# Patient Record
Sex: Female | Born: 1970 | Race: Black or African American | Hispanic: No | Marital: Single | Smoking: Never smoker
Health system: Southern US, Community
[De-identification: ages and names within clinical notes are randomized; demographics above are authoritative.]

## PROBLEM LIST (undated history)

## (undated) DIAGNOSIS — E119 Type 2 diabetes mellitus without complications: Secondary | ICD-10-CM

## (undated) DIAGNOSIS — K3184 Gastroparesis: Secondary | ICD-10-CM

## (undated) DIAGNOSIS — G43909 Migraine, unspecified, not intractable, without status migrainosus: Secondary | ICD-10-CM

## (undated) DIAGNOSIS — M5136 Other intervertebral disc degeneration, lumbar region: Secondary | ICD-10-CM

## (undated) DIAGNOSIS — E785 Hyperlipidemia, unspecified: Secondary | ICD-10-CM

## (undated) DIAGNOSIS — K219 Gastro-esophageal reflux disease without esophagitis: Secondary | ICD-10-CM

## (undated) DIAGNOSIS — F419 Anxiety disorder, unspecified: Secondary | ICD-10-CM

## (undated) DIAGNOSIS — M51369 Other intervertebral disc degeneration, lumbar region without mention of lumbar back pain or lower extremity pain: Secondary | ICD-10-CM

## (undated) DIAGNOSIS — F5105 Insomnia due to other mental disorder: Secondary | ICD-10-CM

## (undated) HISTORY — PX: FRACTURE SURGERY: SHX138

## (undated) HISTORY — DX: Gastroparesis: K31.84

## (undated) HISTORY — PX: TUBAL LIGATION: SHX77

## (undated) HISTORY — DX: Hyperlipidemia, unspecified: E78.5

## (undated) HISTORY — DX: Other intervertebral disc degeneration, lumbar region: M51.36

## (undated) HISTORY — PX: CARPAL TUNNEL RELEASE: SHX101

## (undated) HISTORY — DX: Type 2 diabetes mellitus without complications: E11.9

## (undated) HISTORY — PX: ABDOMINAL HYSTERECTOMY: SHX81

## (undated) HISTORY — DX: Other intervertebral disc degeneration, lumbar region without mention of lumbar back pain or lower extremity pain: M51.369

---

## 2013-06-01 DIAGNOSIS — G5600 Carpal tunnel syndrome, unspecified upper limb: Secondary | ICD-10-CM | POA: Insufficient documentation

## 2015-08-28 ENCOUNTER — Encounter (HOSPITAL_COMMUNITY): Payer: Self-pay | Admitting: *Deleted

## 2015-08-28 ENCOUNTER — Emergency Department (HOSPITAL_COMMUNITY)
Admission: EM | Admit: 2015-08-28 | Discharge: 2015-08-29 | Disposition: A | Discharge status: Home / Self Care | Payer: Self-pay | Attending: Emergency Medicine | Admitting: Emergency Medicine

## 2015-08-28 DIAGNOSIS — K219 Gastro-esophageal reflux disease without esophagitis: Secondary | ICD-10-CM | POA: Insufficient documentation

## 2015-08-28 DIAGNOSIS — F419 Anxiety disorder, unspecified: Secondary | ICD-10-CM | POA: Insufficient documentation

## 2015-08-28 DIAGNOSIS — E119 Type 2 diabetes mellitus without complications: Secondary | ICD-10-CM

## 2015-08-28 DIAGNOSIS — E1165 Type 2 diabetes mellitus with hyperglycemia: Secondary | ICD-10-CM | POA: Insufficient documentation

## 2015-08-28 DIAGNOSIS — Z8679 Personal history of other diseases of the circulatory system: Secondary | ICD-10-CM | POA: Insufficient documentation

## 2015-08-28 DIAGNOSIS — R739 Hyperglycemia, unspecified: Secondary | ICD-10-CM

## 2015-08-28 DIAGNOSIS — Z79899 Other long term (current) drug therapy: Secondary | ICD-10-CM | POA: Insufficient documentation

## 2015-08-28 HISTORY — DX: Insomnia due to other mental disorder: F41.9

## 2015-08-28 HISTORY — DX: Gastro-esophageal reflux disease without esophagitis: K21.9

## 2015-08-28 HISTORY — DX: Migraine, unspecified, not intractable, without status migrainosus: G43.909

## 2015-08-28 HISTORY — DX: Type 2 diabetes mellitus without complications: E11.9

## 2015-08-28 HISTORY — DX: Insomnia due to other mental disorder: F51.05

## 2015-08-28 LAB — BLOOD GAS, ARTERIAL
ACID-BASE DEFICIT: 1.3 mmol/L (ref 0.0–2.0)
Bicarbonate: 23.7 mEq/L (ref 20.0–24.0)
DRAWN BY: 317771
FIO2: 0.21
O2 Saturation: 96.4 %
PCO2 ART: 34.2 mmHg — AB (ref 35.0–45.0)
PO2 ART: 81.6 mmHg (ref 80.0–100.0)
pH, Arterial: 7.431 (ref 7.350–7.450)

## 2015-08-28 LAB — CBC
HEMATOCRIT: 39.6 % (ref 36.0–46.0)
Hemoglobin: 14.5 g/dL (ref 12.0–15.0)
MCH: 30.9 pg (ref 26.0–34.0)
MCHC: 36.6 g/dL — AB (ref 30.0–36.0)
MCV: 84.3 fL (ref 78.0–100.0)
Platelets: 222 10*3/uL (ref 150–400)
RBC: 4.7 MIL/uL (ref 3.87–5.11)
RDW: 12.2 % (ref 11.5–15.5)
WBC: 7.2 10*3/uL (ref 4.0–10.5)

## 2015-08-28 LAB — BASIC METABOLIC PANEL
Anion gap: 12 (ref 5–15)
BUN: 7 mg/dL (ref 6–20)
CHLORIDE: 95 mmol/L — AB (ref 101–111)
CO2: 26 mmol/L (ref 22–32)
CREATININE: 1.05 mg/dL — AB (ref 0.44–1.00)
Calcium: 9.1 mg/dL (ref 8.9–10.3)
GFR calc Af Amer: >60 mL/min (ref >60)
GLUCOSE: 548 mg/dL — AB (ref 65–99)
POTASSIUM: 3.6 mmol/L (ref 3.5–5.1)
Sodium: 133 mmol/L — ABNORMAL LOW (ref 135–145)

## 2015-08-28 LAB — BLOOD GAS, VENOUS
ACID-BASE DEFICIT: 4.3 mmol/L — AB (ref 0.0–2.0)
ACID-BASE DEFICIT: 6.4 mmol/L — AB (ref 0.0–2.0)
BICARBONATE: 18 meq/L — AB (ref 20.0–24.0)
Bicarbonate: 17.5 mEq/L — ABNORMAL LOW (ref 20.0–24.0)
O2 SAT: 52.1 %
O2 SAT: 77.8 %
PCO2 VEN: 60.8 mmHg — AB (ref 45.0–50.0)
PO2 VEN: 53.7 mmHg — AB (ref 30.0–45.0)
pCO2, Ven: 67.7 mmHg — ABNORMAL HIGH (ref 45.0–50.0)
pH, Ven: 7.162 — CL (ref 7.250–7.300)
pH, Ven: 7.163 — CL (ref 7.250–7.300)
pO2, Ven: 34.4 mmHg (ref 30.0–45.0)

## 2015-08-28 LAB — URINE MICROSCOPIC-ADD ON

## 2015-08-28 LAB — URINALYSIS, ROUTINE W REFLEX MICROSCOPIC
BILIRUBIN URINE: NEGATIVE
Glucose, UA: 500 mg/dL — AB
Ketones, ur: 15 mg/dL — AB
Leukocytes, UA: NEGATIVE
Nitrite: NEGATIVE
PH: 5.5 (ref 5.0–8.0)
Protein, ur: NEGATIVE mg/dL
SPECIFIC GRAVITY, URINE: 1.01 (ref 1.005–1.030)
Urobilinogen, UA: 0.2 mg/dL (ref 0.0–1.0)

## 2015-08-28 LAB — CBG MONITORING, ED
GLUCOSE-CAPILLARY: 353 mg/dL — AB (ref 65–99)
Glucose-Capillary: 568 mg/dL (ref 65–99)
Glucose-Capillary: 391 mg/dL — ABNORMAL HIGH (ref 65–99)

## 2015-08-28 MED ORDER — SODIUM CHLORIDE 0.9 % IV BOLUS (SEPSIS)
1000.0000 mL | Freq: Once | INTRAVENOUS | Status: AC
  Administered 2015-08-28: 1000 mL via INTRAVENOUS

## 2015-08-28 MED ORDER — INSULIN ASPART 100 UNIT/ML ~~LOC~~ SOLN
9.0000 [IU] | Freq: Once | SUBCUTANEOUS | Status: AC
  Administered 2015-08-28: 9 [IU] via INTRAVENOUS

## 2015-08-28 NOTE — ED Notes (Signed)
Dr.Schlossman notified of critical ph

## 2015-08-28 NOTE — ED Notes (Signed)
CRITICAL VALUE ALERT  Critical value received:  Ph 7.163  Date of notification:  08/28/2015  Time of notification:  2200  Critical value read back:Yes.    Nurse who received alert:  Villa HerbKristy Stewart Sasaki ,rn  MD notified (1st page):  Dr Dalene Seltzerschlossman  Time of first page:  2200  MD notified (2nd page):  Time of second page:  Responding MD:  Dr Dalene Seltzerschlossman  Time MD responded:  2200

## 2015-08-28 NOTE — ED Notes (Signed)
Pt states her friend checked her blood sugar and it read high on the meter. Pt has polydipsia, polyuria, dizziness, and feeling strange x 2 weeks.

## 2015-08-29 MED ORDER — METFORMIN HCL 500 MG PO TABS
500.0000 mg | ORAL_TABLET | Freq: Once | ORAL | Status: AC
  Administered 2015-08-29: 500 mg via ORAL
  Filled 2015-08-29: qty 1

## 2015-08-29 MED ORDER — METFORMIN HCL 500 MG PO TABS
500.0000 mg | ORAL_TABLET | Freq: Two times a day (BID) | ORAL | Status: DC
Start: 2015-08-29 — End: 2020-02-24

## 2015-08-29 NOTE — ED Provider Notes (Signed)
CSN: [id number]     Arrival date & time 08/28/15  1819 History   First MD Initiated Contact with Patient 08/28/15 1926     Chief Complaint  Patient presents with  . Hyperglycemia     (Consider location/radiation/quality/duration/timing/severity/associated sxs/prior Treatment) Patient is a 44 y.o. female presenting with hyperglycemia.  Hyperglycemia Blood sugar level PTA:  Greater than 500 Severity:  Severe Onset quality:  Unable to specify Duration: feeling off for 2 months, increased thirst for last week. Timing:  Constant Progression:  Unchanged Chronicity:  New Diabetes status:  Non-diabetic (previoiusly pre-diabetes) Context: new diabetes diagnosis   Relieved by:  Nothing Ineffective treatments:  None tried Associated symptoms: dehydration, increased thirst and polyuria   Associated symptoms: no abdominal pain, no altered mental status, no blurred vision, no chest pain, no diaphoresis, no dysuria, no fever, no nausea, no shortness of breath and no vomiting     Past Medical History  Diagnosis Date  . Diabetes mellitus without complication (HCC)     borderline  . Hyposomnia, insomnia or sleeplessness associated with anxiety   . Migraine   . Acid reflux    Past Surgical History  Procedure Laterality Date  . Abdominal hysterectomy      2009  . Fracture surgery      jaw  . Tubal ligation     History reviewed. No pertinent family history. Social History  Substance Use Topics  . Smoking status: Never Smoker   . Smokeless tobacco: None  . Alcohol Use: No   OB History    No data available     Review of Systems  Constitutional: Negative for fever and diaphoresis.  HENT: Negative for sore throat.   Eyes: Negative for blurred vision and visual disturbance.  Respiratory: Negative for cough and shortness of breath.   Cardiovascular: Negative for chest pain.  Gastrointestinal: Negative for nausea, vomiting and abdominal pain.  Endocrine: Positive for polydipsia and  polyuria.  Genitourinary: Negative for dysuria and difficulty urinating.  Musculoskeletal: Negative for back pain and neck pain.  Skin: Negative for rash.  Neurological: Negative for syncope and headaches.      Allergies  Review of patient's allergies indicates no known allergies.  Home Medications   Prior to Admission medications   Medication Sig Start Date End Date Taking? Authorizing Provider  ALPRAZolam Prudy Feeler) 1 MG tablet Take 1 mg by mouth 2 (two) times daily as needed. anxiety 08/25/15   Historical Provider, MD  HYDROcodone-acetaminophen (NORCO) 10-325 MG tablet Take 1 tablet by mouth every 6 (six) hours as needed. pain 08/23/15   Historical Provider, MD  metFORMIN (GLUCOPHAGE) 500 MG tablet Take 1 tablet (500 mg total) by mouth 2 (two) times daily with a meal. 08/29/15   Alvira Monday, MD  nabumetone (RELAFEN) 750 MG tablet Take 750 mg by mouth 2 (two) times daily as needed. Muscle spasm 05/29/15   Historical Provider, MD  omeprazole (PRILOSEC) 20 MG capsule Take 20 mg by mouth 2 (two) times daily as needed. Acid reflux 05/31/15  Yes Historical Provider, MD  sucralfate (CARAFATE) 1 G tablet Take 1 g by mouth 3 (three) times daily as needed. For stomach pain 05/31/15   Historical Provider, MD   BP 138/96 mmHg  Pulse 85  Temp(Src) 98.4 F (36.9 C) (Oral)  Resp 18  Ht  (1.6 m)  Wt 204 lb (92.534 kg)  BMI 36.15 kg/m2  SpO2 99% Physical Exam  Constitutional: She is oriented to person, place, and time. She appears well-developed  and well-nourished. No distress.  HENT:  Head: Normocephalic and atraumatic.  Eyes: Conjunctivae and EOM are normal.  Neck: Normal range of motion.  Cardiovascular: Normal rate, regular rhythm, normal heart sounds and intact distal pulses.  Exam reveals no gallop and no friction rub.   No murmur heard. Pulmonary/Chest: Effort normal and breath sounds normal. No respiratory distress. She has no wheezes. She has no rales.  Abdominal: Soft. She  exhibits no distension. There is no tenderness. There is no guarding.  Musculoskeletal: She exhibits no edema or tenderness.  Neurological: She is alert and oriented to person, place, and time.  Skin: Skin is warm and dry. No rash noted. She is not diaphoretic. No erythema.  Nursing note and vitals reviewed.   ED Course  Procedures (including critical care time) Labs Review Labs Reviewed  BASIC METABOLIC PANEL - Abnormal; Notable for the following:    Sodium 133 (*)    Chloride 95 (*)    Glucose, Bld 548 (*)    Creatinine, Ser 1.05 (*)    All other components within normal limits  CBC - Abnormal; Notable for the following:    MCHC 36.6 (*)    All other components within normal limits  URINALYSIS, ROUTINE W REFLEX MICROSCOPIC (NOT AT [facility]) - Abnormal; Notable for the following:    Color, Urine STRAW (*)    Glucose, UA 500 (*)    Hgb urine dipstick TRACE (*)    Ketones, ur 15 (*)    All other components within normal limits  BLOOD GAS, VENOUS - Abnormal; Notable for the following:    pH, Ven 7.162 (*)    pCO2, Ven 67.7 (*)    Bicarbonate 18.0 (*)    Acid-base deficit 4.3 (*)    All other components within normal limits  URINE MICROSCOPIC-ADD ON - Abnormal; Notable for the following:    Squamous Epithelial / LPF FEW (*)    Bacteria, UA FEW (*)    All other components within normal limits  BLOOD GAS, VENOUS - Abnormal; Notable for the following:    pH, Ven 7.163 (*)    pCO2, Ven 60.8 (*)    pO2, Ven 53.7 (*)    Bicarbonate 17.5 (*)    Acid-base deficit 6.4 (*)    All other components within normal limits  BLOOD GAS, ARTERIAL - Abnormal; Notable for the following:    pCO2 arterial 34.2 (*)    All other components within normal limits  CBG MONITORING, ED - Abnormal; Notable for the following:    Glucose-Capillary 568 (*)    All other components within normal limits  CBG MONITORING, ED - Abnormal; Notable for the following:    Glucose-Capillary 391 (*)    All other  components within normal limits  CBG MONITORING, ED - Abnormal; Notable for the following:    Glucose-Capillary 353 (*)    All other components within normal limits    Imaging Review No results found. I have personally reviewed and evaluated these images and lab results as part of my medical decision-making.   EKG Interpretation None      MDM   Final diagnoses:  Type 2 diabetes mellitus without complication, without long-term current use of insulin (HCC)  Hyperglycemia   44yo female with prior hx of pre-diabetes presents with concern for polyuria, polyldipsia, and glucose found to be over 500.  Pt without signs of DKA.  Initially venous blood gas shows respiratory acidosis with pH of 7.1, this value was repeated and found  to be the same, however clinically does not fit with pt presentation, no sign of CO2 retention, no COPD exacerbation/no somnolence or sign of drug overdose or apnea.  Initially discussed with hospitalist on call, then repeated blood gas as ABG and found to be normal, which is more consistent with pt presentation. Pt given insulin with decrease in blood sugars. WIll start metformin  BID and recommend close follow up with PCP. Patient discharged in stable condition with understanding of reasons to return.      Alvira Monday, MD 08/29/15 1246

## 2015-08-29 NOTE — ED Notes (Signed)
Patient verbalizes understanding of discharge instructions, prescription medications, home care and follow up care. Patient ambulatory out of department at this time. 

## 2018-06-10 ENCOUNTER — Other Ambulatory Visit: Payer: Self-pay | Admitting: Internal Medicine

## 2018-06-10 DIAGNOSIS — M545 Low back pain: Secondary | ICD-10-CM

## 2018-06-17 ENCOUNTER — Ambulatory Visit (HOSPITAL_COMMUNITY)
Admission: RE | Admit: 2018-06-17 | Discharge: 2018-06-17 | Disposition: A | Discharge status: Home / Self Care | Payer: 59 | Source: Ambulatory Visit | Attending: Internal Medicine | Admitting: Internal Medicine

## 2018-06-17 DIAGNOSIS — M47816 Spondylosis without myelopathy or radiculopathy, lumbar region: Secondary | ICD-10-CM | POA: Diagnosis not present

## 2018-06-17 DIAGNOSIS — M5136 Other intervertebral disc degeneration, lumbar region: Secondary | ICD-10-CM | POA: Insufficient documentation

## 2018-06-17 DIAGNOSIS — M545 Low back pain: Secondary | ICD-10-CM | POA: Insufficient documentation

## 2018-07-04 DIAGNOSIS — M4316 Spondylolisthesis, lumbar region: Secondary | ICD-10-CM | POA: Insufficient documentation

## 2018-07-04 DIAGNOSIS — I10 Essential (primary) hypertension: Secondary | ICD-10-CM | POA: Insufficient documentation

## 2018-10-17 DIAGNOSIS — M545 Low back pain, unspecified: Secondary | ICD-10-CM | POA: Insufficient documentation

## 2018-12-19 ENCOUNTER — Other Ambulatory Visit (HOSPITAL_BASED_OUTPATIENT_CLINIC_OR_DEPARTMENT_OTHER): Payer: Self-pay

## 2018-12-19 DIAGNOSIS — R5383 Other fatigue: Secondary | ICD-10-CM

## 2019-01-01 ENCOUNTER — Ambulatory Visit: Payer: BLUE CROSS/BLUE SHIELD | Attending: Internal Medicine | Admitting: Neurology

## 2019-01-01 DIAGNOSIS — R5383 Other fatigue: Secondary | ICD-10-CM | POA: Insufficient documentation

## 2019-01-01 DIAGNOSIS — Z7984 Long term (current) use of oral hypoglycemic drugs: Secondary | ICD-10-CM | POA: Insufficient documentation

## 2019-01-01 DIAGNOSIS — Z79899 Other long term (current) drug therapy: Secondary | ICD-10-CM | POA: Diagnosis not present

## 2019-01-09 NOTE — Procedures (Signed)
   [facility] Aerabella Galasso A. Gerilyn Pilgrim, MD     [email or web address]             NOCTURNAL POLYSOMNOGRAPHY   LOCATION: [facility]  Patient Name: Lacey Bradshaw, Lacey Bradshaw Date: 01/01/2019 Gender: Female D.O.B: 10-08-1971 Age (years): 47 Referring Provider: Catalina Pizza Height (inches): 63 Interpreting Physician: Beryle Beams MD, ABSM Weight (lbs): 217 RPSGT: Peak, Robert BMI: 38 MRN: [id number] Neck Size: 16.00 CLINICAL INFORMATION Sleep Study Type: NPSG     Indication for sleep study: Fatigue     Epworth Sleepiness Score: 0     SLEEP STUDY TECHNIQUE As per the AASM Manual for the Scoring of Sleep and Associated Events v2.3 (April 2016) with a hypopnea requiring 4% desaturations.  The channels recorded and monitored were frontal, central and occipital EEG, electrooculogram (EOG), submentalis EMG (chin), nasal and oral airflow, thoracic and abdominal wall motion, anterior tibialis EMG, snore microphone, electrocardiogram, and pulse oximetry.  MEDICATIONS Medications self-administered by patient taken the night of the study : N/A  Current Outpatient Medications:  .  ALPRAZolam (XANAX) 1 MG tablet, Take 1 mg by mouth 2 (two) times daily as needed. anxiety, Disp: , Rfl: 0 .  HYDROcodone-acetaminophen (NORCO) 10-325 MG tablet, Take 1 tablet by mouth every 6 (six) hours as needed. pain, Disp: , Rfl: 0 .  metFORMIN (GLUCOPHAGE) 500 MG tablet, Take 1 tablet (500 mg total) by mouth 2 (two) times daily with a meal., Disp: 32 tablet, Rfl: 0 .  nabumetone (RELAFEN) 750 MG tablet, Take 750 mg by mouth 2 (two) times daily as needed. Muscle spasm, Disp: , Rfl: 3 .  omeprazole (PRILOSEC) 20 MG capsule, Take 20 mg by mouth 2 (two) times daily as needed. Acid reflux, Disp: , Rfl: 5 .  sucralfate (CARAFATE) 1 G tablet, Take 1 g by mouth 3 (three) times daily as needed. For stomach pain, Disp: , Rfl: 2      SLEEP ARCHITECTURE The study was initiated at 9:40:40 PM and ended at  4:22:45 AM.  Sleep onset time was 43.3 minutes and the sleep efficiency was 83.9%%. The total sleep time was 337.3 minutes.  Stage REM latency was 141.0 minutes.  The patient spent 8.3%% of the night in stage N1 sleep, 77.8%% in stage N2 sleep, 2.1%% in stage N3 and 11.9% in REM.  Alpha intrusion was absent.  Supine sleep was 66.58%.  RESPIRATORY PARAMETERS The overall apnea/hypopnea index (AHI) was 2.1 per hour. There were 7 total apneas, including 0 obstructive, 7 central and 0 mixed apneas. There were 5 hypopneas and 33 RERAs.  The AHI during Stage REM sleep was 0.0 per hour.  AHI while supine was 1.9 per hour.  The mean oxygen saturation was 93.8%. The minimum SpO2 during sleep was 84.0%.  soft snoring was noted during this study.  CARDIAC DATA The 2 lead EKG demonstrated sinus rhythm. The mean heart rate was 72.3 beats per minute. Other EKG findings include: None.  LEG MOVEMENT DATA The total PLMS were 0 with a resulting PLMS index of 0.0. Associated arousal with leg movement index was 0.0.  IMPRESSIONS 1. No significant obstructive sleep apnea occurred during this study. 2. No significant central sleep apnea occurred during this study.   Argie Ramming, MD Diplomate, American Board of Sleep Medicine. ELECTRONICALLY SIGNED ON:  01/09/2019, 8:30 AM [facility] PH: [phone]   FX: [phone] ACCREDITED BY THE AMERICAN ACADEMY OF SLEEP MEDICINE

## 2019-01-10 ENCOUNTER — Encounter (HOSPITAL_COMMUNITY): Payer: Self-pay | Admitting: Emergency Medicine

## 2019-01-10 ENCOUNTER — Other Ambulatory Visit: Payer: Self-pay

## 2019-01-10 ENCOUNTER — Emergency Department (HOSPITAL_COMMUNITY)
Admission: EM | Admit: 2019-01-10 | Discharge: 2019-01-10 | Disposition: A | Discharge status: Home / Self Care | Payer: BLUE CROSS/BLUE SHIELD | Attending: Emergency Medicine | Admitting: Emergency Medicine

## 2019-01-10 ENCOUNTER — Emergency Department (HOSPITAL_COMMUNITY): Payer: BLUE CROSS/BLUE SHIELD

## 2019-01-10 DIAGNOSIS — Z7984 Long term (current) use of oral hypoglycemic drugs: Secondary | ICD-10-CM | POA: Diagnosis not present

## 2019-01-10 DIAGNOSIS — J09X2 Influenza due to identified novel influenza A virus with other respiratory manifestations: Secondary | ICD-10-CM | POA: Insufficient documentation

## 2019-01-10 DIAGNOSIS — J029 Acute pharyngitis, unspecified: Secondary | ICD-10-CM | POA: Diagnosis present

## 2019-01-10 DIAGNOSIS — J101 Influenza due to other identified influenza virus with other respiratory manifestations: Secondary | ICD-10-CM

## 2019-01-10 LAB — INFLUENZA PANEL BY PCR (TYPE A & B)
Influenza A By PCR: POSITIVE — AB
Influenza B By PCR: NEGATIVE

## 2019-01-10 LAB — GROUP A STREP BY PCR: Group A Strep by PCR: NOT DETECTED

## 2019-01-10 MED ORDER — BENZONATATE 100 MG PO CAPS: 100.0000 mg | ORAL_CAPSULE | Freq: Three times a day (TID) | ORAL | 0 refills | Status: DC | PRN

## 2019-01-10 MED ORDER — ACETAMINOPHEN 325 MG PO TABS
650.0000 mg | ORAL_TABLET | Freq: Once | ORAL | Status: AC | PRN
  Administered 2019-01-10: 650 mg via ORAL
  Filled 2019-01-10: qty 2

## 2019-01-10 MED ORDER — OSELTAMIVIR PHOSPHATE 75 MG PO CAPS: 75.0000 mg | ORAL_CAPSULE | Freq: Two times a day (BID) | ORAL | 0 refills | Status: DC

## 2019-01-10 NOTE — ED Triage Notes (Signed)
Pt reports generalized body aches for three days, no OTC medications at home. Flu shot in December.

## 2019-01-10 NOTE — ED Provider Notes (Signed)
[facility] EMERGENCY DEPARTMENT Provider Note   CSN: [id number] Arrival date & time: 01/10/19  0840    History   Chief Complaint Chief Complaint  Patient presents with  . Influenza    HPI Lacey Bradshaw is a 48 y.o. female.     HPI Pt was seen at 0930.  Per pt, c/o gradual onset and persistence of constant sore throat, runny/stuffy nose, sinus congestion, and cough for the past 2-3 days.  Has been associated with subjective home fevers/chills and generalized body aches/fatigue. Denies rash, no CP/SOB, no N/V/D, no abd pain.     Past Medical History:  Diagnosis Date  . Acid reflux   . Diabetes mellitus without complication (HCC)    borderline  . Hyposomnia, insomnia or sleeplessness associated with anxiety   . Migraine     There are no active problems to display for this patient.   Past Surgical History:  Procedure Laterality Date  . ABDOMINAL HYSTERECTOMY     2009  . CARPAL TUNNEL RELEASE Left   . FRACTURE SURGERY     jaw  . TUBAL LIGATION       OB History   No obstetric history on file.      Home Medications    Prior to Admission medications   Medication Sig Start Date End Date Taking? Authorizing Provider  ALPRAZolam Prudy Feeler) 1 MG tablet Take 1 mg by mouth 2 (two) times daily as needed. anxiety 08/25/15   [provider]  HYDROcodone-acetaminophen (NORCO) 10-325 MG tablet Take 1 tablet by mouth every 6 (six) hours as needed. pain 08/23/15   [provider]  metFORMIN (GLUCOPHAGE) 500 MG tablet Take 1 tablet (500 mg total) by mouth 2 (two) times daily with a meal. 08/29/15   Alvira Monday, MD  nabumetone (RELAFEN) 750 MG tablet Take 750 mg by mouth 2 (two) times daily as needed. Muscle spasm 05/29/15   [provider]  omeprazole (PRILOSEC) 20 MG capsule Take 20 mg by mouth 2 (two) times daily as needed. Acid reflux 05/31/15   [provider]  sucralfate (CARAFATE) 1 G tablet Take 1 g by mouth 3 (three) times daily as  needed. For stomach pain 05/31/15   [provider]    Family History History reviewed. No pertinent family history.  Social History Social History   Tobacco Use  . Smoking status: Never Smoker  . Smokeless tobacco: Never Used  Substance Use Topics  . Alcohol use: No  . Drug use: No     Allergies   Patient has no known allergies.   Review of Systems Review of Systems ROS: Statement: All systems negative except as marked or noted in the HPI; Constitutional: +fever and chills, generalized body aches/fatigue.. ; ; Eyes: Negative for eye pain, redness and discharge. ; ; ENMT: Negative for ear pain, hoarseness, +nasal congestion, sinus pressure and sore throat. ; ; Cardiovascular: Negative for chest pain, palpitations, diaphoresis, dyspnea and peripheral edema. ; ; Respiratory: +cough. Negative for wheezing and stridor. ; ; Gastrointestinal: Negative for nausea, vomiting, diarrhea, abdominal pain, blood in stool, hematemesis, jaundice and rectal bleeding. . ; ; Genitourinary: Negative for dysuria, flank pain and hematuria. ; ; Musculoskeletal: Negative for back pain and neck pain. Negative for swelling and trauma.; ; Skin: Negative for pruritus, rash, abrasions, blisters, bruising and skin lesion.; ; Neuro: Negative for headache, lightheadedness and neck stiffness. Negative for weakness, altered level of consciousness, altered mental status, extremity weakness, paresthesias, involuntary movement, seizure and syncope.  Physical Exam Updated Vital Signs BP (!) 150/72   Pulse 99   Temp (!) 101.3 F (38.5 C) (Oral)   Resp 18   Ht 5\' 3"  (1.6 m)   Wt 103 kg   SpO2 98%   BMI 40.21 kg/m   Physical Exam 0935: Physical examination:  Nursing notes reviewed; Vital signs and O2 SAT reviewed;  Constitutional: Well developed, Well nourished, Well hydrated, In no acute distress; Head:  Normocephalic, atraumatic; Eyes: EOMI, PERRL, No scleral icterus; ENMT: TM's clear bilat.  +edemetous nasal turbinates bilat with clear rhinorrhea. Mouth and pharynx without lesions. No tonsillar exudates. No intra-oral edema. No submandibular or sublingual edema. No hoarse voice, no drooling, no stridor. No pain with manipulation of larynx. No trismus. Mouth and pharynx normal, Mucous membranes moist; Neck: Supple, Full range of motion, No lymphadenopathy; Cardiovascular: Regular rate and rhythm, No gallop; Respiratory: Breath sounds clear & equal bilaterally, No wheezes.  Speaking full sentences with ease, Normal respiratory effort/excursion; Chest: Nontender, Movement normal; Abdomen: Soft, Nontender, Nondistended, Normal bowel sounds; Genitourinary: No CVA tenderness; Extremities: Peripheral pulses normal, No tenderness, No edema, No calf edema or asymmetry.; Neuro: AA&Ox3, Major CN grossly intact.  Speech clear. No gross focal motor or sensory deficits in extremities.; Skin: Color normal, Warm, Dry.     ED Treatments / Results  Labs (all labs ordered are listed, but only abnormal results are displayed)   EKG None  Radiology   Procedures Procedures (including critical care time)  Medications Ordered in ED Medications  acetaminophen (TYLENOL) tablet 650 mg (650 mg Oral Given 01/10/19 0921)     Initial Impression / Assessment and Plan / ED Course  I have reviewed the triage vital signs and the nursing notes.  Pertinent labs & imaging results that were available during my care of the patient were reviewed by me and considered in my medical decision making (see chart for details).     MDM Reviewed: previous chart, nursing note and vitals Interpretation: labs and x-ray    Results for orders placed or performed during the hospital encounter of 01/10/19  Group A Strep by PCR  Result Value Ref Range   Group A Strep by PCR NOT DETECTED NOT DETECTED  Influenza panel by PCR (type A & B)  Result Value Ref Range   Influenza A By PCR POSITIVE (A) NEGATIVE   Influenza B By  PCR NEGATIVE NEGATIVE   Dg Chest 2 View Result Date: 01/10/2019 CLINICAL DATA:  Chest pain, cough EXAM: CHEST - 2 VIEW COMPARISON:  01/05/2018 FINDINGS: Heart and mediastinal contours are within normal limits. No focal opacities or effusions. No acute bony abnormality. IMPRESSION: No active cardiopulmonary disease. Electronically Signed   By: Charlett Nose M.D.   On: 01/10/2019 10:19    1100:  Tx for flu. Dx and testing d/w pt.  Questions answered.  Verb understanding, agreeable to d/c home with outpt f/u.   Final Clinical Impressions(s) / ED Diagnoses   Final diagnoses:  None    ED Discharge Orders    None       Samuel Jester, DO 01/14/19 1747

## 2019-01-10 NOTE — Discharge Instructions (Signed)
Take over the counter tylenol and ibuprofen, as directed on packaging, as needed for discomfort.  Gargle with warm water several times per day to help with discomfort.  May also use over the counter sore throat pain medicines such as chloraseptic or sucrets, as directed on packaging, as needed for discomfort. Increase your fluids for the next several days. Eat a bland diet.  Take the prescriptions as directed.  Call your regular medical doctor today to schedule a follow up appointment within the next week.  Return to the Emergency Department immediately sooner if worsening.

## 2019-01-26 ENCOUNTER — Encounter (HOSPITAL_COMMUNITY): Payer: Self-pay | Admitting: Physical Therapy

## 2019-01-26 ENCOUNTER — Other Ambulatory Visit: Payer: Self-pay

## 2019-01-26 ENCOUNTER — Ambulatory Visit (HOSPITAL_COMMUNITY): Payer: BLUE CROSS/BLUE SHIELD | Attending: Internal Medicine | Admitting: Physical Therapy

## 2019-01-26 ENCOUNTER — Encounter (HOSPITAL_COMMUNITY): Payer: Self-pay

## 2019-01-26 ENCOUNTER — Ambulatory Visit (HOSPITAL_COMMUNITY): Payer: BLUE CROSS/BLUE SHIELD

## 2019-01-26 DIAGNOSIS — M542 Cervicalgia: Secondary | ICD-10-CM | POA: Diagnosis not present

## 2019-01-26 DIAGNOSIS — M6281 Muscle weakness (generalized): Secondary | ICD-10-CM

## 2019-01-26 NOTE — Patient Instructions (Addendum)
Flexibility: Neck Retraction    Pull head straight back, keeping eyes and jaw level. Repeat _10___ times per set. Do _1___ sets per session. Do 2___ sessions per day.  http://orth.exer.us/344   Copyright  VHI. All rights reserved.  Scapular Retraction (Standing)    With arms at sides, pinch shoulder blades together. Repeat _10___ times per set. Do _1___ sets per session. Do _2___ sessions per day.  http://orth.exer.us/944   Copyright  VHI. All rights reserved.   

## 2019-01-26 NOTE — Therapy (Signed)
[facility] [address] [place], [place], [address] Phone: [phone]   Fax:  [phone]  Physical Therapy Evaluation  Patient Details  Name: Lacey Bradshaw MRN: [id number] Date of Birth: 1971-01-28 Referring Provider (PT): Nita Sells    Encounter Date: 01/26/2019  PT End of Session - 01/26/19 1129    Visit Number  1    Number of Visits  12    Date for PT Re-Evaluation  03/09/19    Authorization Type  BCBS     Authorization - Visit Number  1    Authorization - Number of Visits  100    PT Start Time  1040    PT Stop Time  1120    PT Time Calculation (min)  40 min    Activity Tolerance  Patient tolerated treatment well    Behavior During Therapy  [facility] for tasks assessed/performed       Past Medical History:  Diagnosis Date  . Acid reflux   . Diabetes mellitus without complication (HCC)    borderline  . Hyposomnia, insomnia or sleeplessness associated with anxiety   . Migraine     Past Surgical History:  Procedure Laterality Date  . ABDOMINAL HYSTERECTOMY     2009  . CARPAL TUNNEL RELEASE Left   . FRACTURE SURGERY     jaw  . TUBAL LIGATION      There were no vitals filed for this visit.   Subjective Assessment - 01/26/19 1044    Subjective  Lacey Bradshaw states that she has had neck pain for a long time she is not sure what started it but she now has pain on the right side of her neck.  The pain goes to her elbow.  She has had Heat, creames and medication.  Recently she has started Lyrica which has helped but makes her sleepy.  She has been having headaces about 4 x a week  which will last a couple of hours.      Pertinent History  DM    How long can you sit comfortably?  no problem but when she gets up she has increased pain.      How long can you stand comfortably?  Able to stand for 15 minutes     How long can you walk comfortably?  Pt is able to walk for about 30 minutes     Patient Stated Goals  To have less pain,  less headaches and improved sleeping.  Currently getting between 3-4 hrs of sleep.     Currently in Pain?  Yes    Pain Score  8     Pain Location  Shoulder    Pain Orientation  Right    Pain Descriptors / Indicators  Aching    Pain Type  Chronic pain    Pain Radiating Towards  Rt elbow     Pain Onset  More than a month ago    Pain Frequency  Constant    Aggravating Factors   lying down     Pain Relieving Factors  meds and biofreeze     Effect of Pain on Daily Activities  limits          [facility] PT Assessment - 01/26/19 0001      Assessment   Medical Diagnosis  cervicalgia     Referring Provider (PT)  Nita Sells     Onset Date/Surgical Date  01/06/19    Next MD Visit  01/23/2019    Prior  Therapy  none      Precautions   Precautions  None      Restrictions   Weight Bearing Restrictions  No      Balance Screen   Has the patient fallen in the past 6 months  No    Has the patient had a decrease in activity level because of a fear of falling?   Yes    Is the patient reluctant to leave their home because of a fear of falling?   No      Home Public house manager residence      Prior Function   Level of Independence  Independent    Vocation  --   on leave for [employer]    Leisure  nothing, use to play bingo but irritates her low back       Cognition   Overall Cognitive Status  Within Functional Limits for tasks assessed      Observation/Other Assessments   Focus on Therapeutic Outcomes (FOTO)   46      Posture/Postural Control   Posture/Postural Control  Postural limitations    Postural Limitations  Rounded Shoulders      ROM / Strength   AROM / PROM / Strength  AROM;Strength      AROM   Overall AROM Comments  shoulder all WFL     AROM Assessment Site  Cervical    Cervical Flexion  wfl reps no change     Cervical Extension  wfl reps no change     Cervical - Right Side Bend  wfl reps increase sx     Cervical - Left Side Bend  wfl reps increse sx      Cervical - Right Rotation  28    Cervical - Left Rotation  58      Strength   Overall Strength Comments  --   hand grip:  RT :  35#; LT 42#;  fast exchange:  B 62#    Strength Assessment Site  Shoulder;Cervical;Other (comment)    Right/Left Shoulder  --   Shoulder strength wfl    Cervical Extension  4/5    Cervical - Right Side Bend  4/5    Cervical - Left Side Bend  4/5      Palpation   Palpation comment  mm spasm B upper trap B                 Objective measurements completed on examination: See above findings.      [facility] Adult PT Treatment/Exercise - 01/26/19 0001      Exercises   Exercises  Neck      Neck Exercises: Seated   Neck Retraction  10 reps    Other Seated Exercise  sitting tall/ scapular retraction x 10 each              PT Education - 01/26/19 1127    Education Details  Hep; explained the importance of posture in cervical and lumbar pain     Person(s) Educated  Patient    Methods  Explanation    Comprehension  Verbalized understanding;Returned demonstration       PT Short Term Goals - 01/26/19 1147      PT SHORT TERM GOAL #1   Title  Pt cervical rotation to be at least 60 degrees on the right to allow safer driving     Time  3    Period  Weeks    Status  New    Target Date  02/16/19      PT SHORT TERM GOAL #2   Title  Pt Rt shoulder pain to be no greater that a 5/10 to allow pt to complete housework without having to rest.     Time  3    Period  Weeks    Status  New      PT SHORT TERM GOAL #3   Title  Pt radicular sx to be to mid arm only to demonstrate decreased nerve irritation.     Time  3    Period  Weeks    Status  New        PT Long Term Goals - 01/26/19 1149      PT LONG TERM GOAL #1   Title  PT hand grip to be at 50# B to allow improved grasp for opening containers.     Time  6    Period  Weeks    Status  New    Target Date  03/09/19      PT LONG TERM GOAL #2   Title  Pt to have no radiating sx and to  be I in exercises to keep radicular sx away.     Time  6    Period  Weeks    Status  New      PT LONG TERM GOAL #3   Title  PT cervical and Rt shoulder pain to be not greater than a 3/10 to allow pt to sleep at least 6 hours per night.     Time  6    Period  Weeks    Status  New             Plan - 01/26/19 1130    Clinical Impression Statement  Lacey Bradshaw is a 48 yo female who has been on disability for the past year due to cervical pain.   She has never been to therapy for her neck and has only tried heat, ice and medication. Evaluation demonstrates decreased cervical rotation, slight decreased strength, increased pain, increased mm spasm and postural dysfunction.  Lacey Bradshaw will benefit from skilled pt to address theses areas and improve her functional ability.  Therapist explained to patient that therapist will not fill out disability papers.  If MD would like objective data to fill disability questions more accurately this therapist would recommend that Lacey Bradshaw has a Associate Professor which is done at [facility] out-patient  on [address] in [place], [place].  [phone].  However, due to the fact that this patient has never had therapy before I believe she will benefit from therapy to address her pain issues to allow her to become more functional .    Personal Factors and Comorbidities  Behavior Pattern;Comorbidity 3+;Past/Current Experience;Time since onset of injury/illness/exacerbation    Comorbidities  DM, LBP, RA,     Examination-Activity Limitations  Caring for Others;Carry;Lift;Locomotion Level;Sleep    Examination-Participation Restrictions  Cleaning;Driving;Laundry;Other;Yard Work    Conservation officer, historic buildings  Evolving/Moderate complexity    Clinical Decision Making  Moderate    Rehab Potential  Good    PT Frequency  2x / week    PT Duration  6 weeks    PT Treatment/Interventions  ADLs/Self Care Home Management;Manual techniques;Patient/family  education;Therapeutic exercise;Therapeutic activities;Traction;Joint Manipulations;Dry needling    PT Next Visit Plan  begin 3-D cervical and thoracic excursion, give putty for hand grip, W back, x to v and manual .  Progress cervical  stabilization.     PT Home Exercise Plan  tall sitting posture, scapular retraction, cervical retraction        Patient will benefit from skilled therapeutic intervention in order to improve the following deficits and impairments:  Postural dysfunction, Pain, Decreased activity tolerance, Decreased range of motion, Decreased strength  Visit Diagnosis: Cervicalgia - Plan: PT plan of care cert/re-cert  Muscle weakness (generalized) - Plan: PT plan of care cert/re-cert     Problem List There are no active problems to display for this patient.  Virgina Organ, PT CLT [phone] 01/26/2019, 11:55 AM  [facility] [address] [place], [place], [address] Phone: [phone]   Fax:  [phone]  Name: Serria Sloma MRN: [id number] Date of Birth: 03-23-1971

## 2019-01-27 ENCOUNTER — Telehealth (HOSPITAL_COMMUNITY): Payer: Self-pay

## 2019-01-27 NOTE — Telephone Encounter (Signed)
Called and left message concerning need to cancel apts for 2 weeks minimal due to COVID-19 and attempted to reduce spread of virus.  Included contact information for questions.    [address], LPTA; CBIS [phone]

## 2019-01-30 ENCOUNTER — Encounter (HOSPITAL_COMMUNITY): Payer: BLUE CROSS/BLUE SHIELD

## 2019-02-01 ENCOUNTER — Encounter (HOSPITAL_COMMUNITY): Payer: BLUE CROSS/BLUE SHIELD

## 2019-02-02 ENCOUNTER — Telehealth (HOSPITAL_COMMUNITY): Payer: Self-pay | Admitting: Physical Therapy

## 2019-02-02 NOTE — Telephone Encounter (Signed)
Therapist called and left a message to see if patient had any questions about HEP or any other questions or concerns. Provided clinic number.  Verne Carrow PT, DPT 2:00 PM, 02/02/19 [phone]

## 2019-02-07 ENCOUNTER — Encounter (HOSPITAL_COMMUNITY): Payer: BLUE CROSS/BLUE SHIELD

## 2019-02-08 ENCOUNTER — Telehealth (HOSPITAL_COMMUNITY): Payer: Self-pay

## 2019-02-08 NOTE — Telephone Encounter (Signed)
Called and spoke to pt regarding our clinic cancelling all future appointments at this time due to COVID-19 in order to ensure her safety and out staff's. Pt interested in participating in telehealth sessions once it is set up. She has internet and a computer/smartphone/device that has video capability. PT educated pt that our clinic would be in touch with her regarding this once it is setup. She had no questions regarding her HEP and was happy with her exercises thus far.   Jac Canavan PT, DPT

## 2019-02-09 ENCOUNTER — Encounter (HOSPITAL_COMMUNITY): Payer: BLUE CROSS/BLUE SHIELD | Admitting: Physical Therapy

## 2019-02-14 ENCOUNTER — Encounter (HOSPITAL_COMMUNITY): Payer: BLUE CROSS/BLUE SHIELD

## 2019-02-16 ENCOUNTER — Encounter (HOSPITAL_COMMUNITY): Payer: BLUE CROSS/BLUE SHIELD

## 2019-02-20 ENCOUNTER — Telehealth (HOSPITAL_COMMUNITY): Payer: Self-pay | Admitting: Internal Medicine

## 2019-02-20 NOTE — Telephone Encounter (Signed)
02/20/19  I left patient a message to ask if she had any other insurance coverage other than BC[employer].  When I called to verify for Telehealth visit I was told the coverage was terminated 01/28/19.  I also left in the message that I would need the insurance information before we could scehdule her.

## 2019-02-21 ENCOUNTER — Encounter (HOSPITAL_COMMUNITY): Payer: BLUE CROSS/BLUE SHIELD | Admitting: Physical Therapy

## 2019-02-22 ENCOUNTER — Telehealth (HOSPITAL_COMMUNITY): Payer: Self-pay | Admitting: Physical Therapy

## 2019-02-22 NOTE — Telephone Encounter (Signed)
Called l/m to get update on insurance for this patient, req return phone call. NF 02/22/2019

## 2019-02-23 ENCOUNTER — Telehealth (HOSPITAL_COMMUNITY): Payer: Self-pay | Admitting: Physical Therapy

## 2019-02-23 NOTE — Telephone Encounter (Signed)
L/m to get up to date information for patient's new insurance, requested return phone call. Patient is aware we can be reached by telephone during limited business hours in the meantime.

## 2019-02-24 ENCOUNTER — Encounter (HOSPITAL_COMMUNITY): Payer: BLUE CROSS/BLUE SHIELD | Admitting: Physical Therapy

## 2019-02-27 ENCOUNTER — Encounter (HOSPITAL_COMMUNITY): Payer: BLUE CROSS/BLUE SHIELD

## 2019-02-28 ENCOUNTER — Telehealth (HOSPITAL_COMMUNITY): Payer: Self-pay | Admitting: Physical Therapy

## 2019-02-28 NOTE — Telephone Encounter (Signed)
° ° °  L/m to get up to date information for patient's new insurance, requested return phone call. Patient is aware we can be reached by telephone during limited business hours in the meantime. Need to know if pt has up date on insurance and if she wants to be on hold until we reopen.

## 2019-03-01 ENCOUNTER — Telehealth (HOSPITAL_COMMUNITY): Payer: Self-pay

## 2019-03-01 ENCOUNTER — Encounter (HOSPITAL_COMMUNITY): Payer: BLUE CROSS/BLUE SHIELD

## 2019-03-01 NOTE — Telephone Encounter (Signed)
I contacted Ms. Oyervides at her home number today. I offered her new exercises and she feels what she currently has is enough to keep her going. She is hoping to hear from her new insurance company today to get the information on her policy and confirm she is covered once she pays her premium. She is still interested in telehealth therapy and I asked that she call us once she gets her new insurance information. I informed her we will touch base again next week if we haven't heard from her by that time.   Valentino Saxon, PT, DPT, [facility] Physical Therapist with [facility]  03/01/2019 11:34 AM

## 2019-03-07 ENCOUNTER — Encounter (HOSPITAL_COMMUNITY): Payer: BLUE CROSS/BLUE SHIELD | Admitting: Physical Therapy

## 2019-03-09 ENCOUNTER — Encounter (HOSPITAL_COMMUNITY): Payer: BLUE CROSS/BLUE SHIELD | Admitting: Physical Therapy

## 2019-03-10 ENCOUNTER — Telehealth (HOSPITAL_COMMUNITY): Payer: Self-pay

## 2019-03-10 NOTE — Telephone Encounter (Signed)
I attempted to call Ms. Slutsky at her home number. She did not answer and I left a voice message letting her know we were checking in to see how she is feeling and see how her exercises are going. I provided our office number and encouraged her to call if she has any questions or concerns.   Valentino Saxon, PT, DPT, [facility] Physical Therapist with [facility]  03/10/2019 4:30 PM

## 2019-03-14 ENCOUNTER — Encounter (HOSPITAL_COMMUNITY): Payer: BLUE CROSS/BLUE SHIELD

## 2019-03-16 ENCOUNTER — Encounter (HOSPITAL_COMMUNITY): Payer: BLUE CROSS/BLUE SHIELD

## 2019-03-21 ENCOUNTER — Telehealth (HOSPITAL_COMMUNITY): Payer: Self-pay | Admitting: Physical Therapy

## 2019-03-21 NOTE — Telephone Encounter (Signed)
was contacted today by phone regarding resuming therapy services following our temporary reduction of Services secondary to COVID-19.  Unable to reach patient and left a voicemail regarding continuation of services vs discharge.  In light of multiple attempts to reach pateint over the last month, explained we would discharge if no intent received by end of this week and she would have to get a new order from her MD if she wished to resume.  Pt left with return phone number for clinic.  Lurena Nida, PTA/CLT [phone]

## 2019-11-15 ENCOUNTER — Other Ambulatory Visit: Payer: Self-pay

## 2019-11-16 ENCOUNTER — Ambulatory Visit: Payer: Medicaid - Out of State | Attending: Internal Medicine

## 2020-02-24 ENCOUNTER — Emergency Department (HOSPITAL_COMMUNITY): Payer: BLUE CROSS/BLUE SHIELD

## 2020-02-24 ENCOUNTER — Emergency Department (HOSPITAL_COMMUNITY)
Admission: EM | Admit: 2020-02-24 | Discharge: 2020-02-24 | Disposition: A | Discharge status: Home / Self Care | Payer: BLUE CROSS/BLUE SHIELD | Attending: Emergency Medicine | Admitting: Emergency Medicine

## 2020-02-24 ENCOUNTER — Encounter (HOSPITAL_COMMUNITY): Payer: Self-pay

## 2020-02-24 ENCOUNTER — Other Ambulatory Visit: Payer: Self-pay

## 2020-02-24 DIAGNOSIS — Z794 Long term (current) use of insulin: Secondary | ICD-10-CM | POA: Insufficient documentation

## 2020-02-24 DIAGNOSIS — R519 Headache, unspecified: Secondary | ICD-10-CM | POA: Diagnosis not present

## 2020-02-24 DIAGNOSIS — Z79899 Other long term (current) drug therapy: Secondary | ICD-10-CM | POA: Diagnosis not present

## 2020-02-24 DIAGNOSIS — E1165 Type 2 diabetes mellitus with hyperglycemia: Secondary | ICD-10-CM | POA: Insufficient documentation

## 2020-02-24 DIAGNOSIS — R739 Hyperglycemia, unspecified: Secondary | ICD-10-CM

## 2020-02-24 LAB — URINALYSIS, ROUTINE W REFLEX MICROSCOPIC
Bacteria, UA: NONE SEEN
Bilirubin Urine: NEGATIVE
Glucose, UA: >=500 mg/dL — AB
Hgb urine dipstick: NEGATIVE
Ketones, ur: NEGATIVE mg/dL
Leukocytes,Ua: NEGATIVE
Nitrite: NEGATIVE
Protein, ur: NEGATIVE mg/dL
Specific Gravity, Urine: 1.028 (ref 1.005–1.030)
pH: 6 (ref 5.0–8.0)

## 2020-02-24 LAB — BASIC METABOLIC PANEL
Anion gap: 10 (ref 5–15)
BUN: 13 mg/dL (ref 6–20)
CO2: 23 mmol/L (ref 22–32)
Calcium: 8.6 mg/dL — ABNORMAL LOW (ref 8.9–10.3)
Chloride: 95 mmol/L — ABNORMAL LOW (ref 98–111)
Creatinine, Ser: 1.06 mg/dL — ABNORMAL HIGH (ref 0.44–1.00)
GFR calc Af Amer: >60 mL/min (ref >60)
GFR calc non Af Amer: >60 mL/min (ref >60)
Glucose, Bld: 628 mg/dL (ref 70–99)
Potassium: 4.5 mmol/L (ref 3.5–5.1)
Sodium: 128 mmol/L — ABNORMAL LOW (ref 135–145)

## 2020-02-24 LAB — CBG MONITORING, ED
Glucose-Capillary: >600 mg/dL (ref 70–99)
Glucose-Capillary: 305 mg/dL — ABNORMAL HIGH (ref 70–99)
Glucose-Capillary: 348 mg/dL — ABNORMAL HIGH (ref 70–99)

## 2020-02-24 LAB — TROPONIN I (HIGH SENSITIVITY): Troponin I (High Sensitivity): <2 ng/L (ref <18)

## 2020-02-24 LAB — BLOOD GAS, VENOUS
Acid-base deficit: 0 mmol/L (ref 0.0–2.0)
Bicarbonate: 23.9 mmol/L (ref 20.0–28.0)
FIO2: 21
O2 Saturation: 89 %
Patient temperature: 37
pCO2, Ven: 44 mmHg (ref 44.0–60.0)
pH, Ven: 7.367 (ref 7.250–7.430)
pO2, Ven: 59 mmHg — ABNORMAL HIGH (ref 32.0–45.0)

## 2020-02-24 LAB — CBC
HCT: 40.6 % (ref 36.0–46.0)
Hemoglobin: 14.1 g/dL (ref 12.0–15.0)
MCH: 30.3 pg (ref 26.0–34.0)
MCHC: 34.7 g/dL (ref 30.0–36.0)
MCV: 87.1 fL (ref 80.0–100.0)
Platelets: 214 10*3/uL (ref 150–400)
RBC: 4.66 MIL/uL (ref 3.87–5.11)
RDW: 12 % (ref 11.5–15.5)
WBC: 7.3 10*3/uL (ref 4.0–10.5)
nRBC: 0 % (ref 0.0–0.2)

## 2020-02-24 MED ORDER — ACETAMINOPHEN 325 MG PO TABS
650.0000 mg | ORAL_TABLET | Freq: Once | ORAL | Status: AC
  Administered 2020-02-24: 650 mg via ORAL
  Filled 2020-02-24: qty 2

## 2020-02-24 MED ORDER — SODIUM CHLORIDE 0.9 % IV BOLUS
1000.0000 mL | Freq: Once | INTRAVENOUS | Status: AC
  Administered 2020-02-24: 1000 mL via INTRAVENOUS

## 2020-02-24 MED ORDER — ONDANSETRON HCL 4 MG/2ML IJ SOLN
4.0000 mg | Freq: Once | INTRAMUSCULAR | Status: AC
  Administered 2020-02-24: 4 mg via INTRAVENOUS
  Filled 2020-02-24: qty 2

## 2020-02-24 MED ORDER — SODIUM CHLORIDE 0.9 % IV BOLUS
1000.0000 mL | Freq: Once | INTRAVENOUS | Status: AC
  Administered 2020-02-24: 21:00:00 1000 mL via INTRAVENOUS

## 2020-02-24 MED ORDER — INSULIN REGULAR BOLUS VIA INFUSION
10.0000 [IU] | Freq: Once | INTRAVENOUS | Status: DC
  Filled 2020-02-24: qty 10

## 2020-02-24 MED ORDER — INSULIN ASPART 100 UNIT/ML ~~LOC~~ SOLN
10.0000 [IU] | Freq: Once | SUBCUTANEOUS | Status: AC
  Administered 2020-02-24: 10 [IU] via INTRAVENOUS
  Filled 2020-02-24: qty 1

## 2020-02-24 MED ORDER — METOCLOPRAMIDE HCL 5 MG/ML IJ SOLN
10.0000 mg | Freq: Once | INTRAMUSCULAR | Status: AC
  Administered 2020-02-24: 10 mg via INTRAVENOUS
  Filled 2020-02-24: qty 2

## 2020-02-24 NOTE — ED Notes (Signed)
Date and time results received: 02/24/20 2010  Test: Glucose Critical Value: 628  Name of Provider Notified: [email or web address]  Orders Received? Or Actions Taken?: na

## 2020-02-24 NOTE — ED Provider Notes (Signed)
[facility] EMERGENCY DEPARTMENT Provider Note   CSN: [id number] Arrival date & time: 02/24/20  1905     History Chief Complaint  Patient presents with  . Hyperglycemia    Lacey Bradshaw is a 49 y.o. female with a past medical history of type 2 diabetes on insulin, migraine that presents to the emergency department for elevated blood sugar and headache.  She states that she checked her blood sugar this morning and it read above 600.  She states that she uses sliding scale and used about 30 units of Levemir which did not help.  She states that her normal's blood sugars range in the 500s, which has been occurring for the past couple of months.  She is currently taking 2000 of Metformin 10 mg of glipizide along with insulins NovoLog and Levemir.  She is medication compliant. She is seeing Dr. Margo Aye for her diabetes and they have been trying to control it for many months. Chart review does not show any recent visits. She states that her last A1c was 13.3.    She states that she came to the emergency department today because she was having a headache along with her high blood sugar.  She states that the headache is under her eyes and started immediately last night and has stayed about an 8 /10.  She says that it is constant,  it is not the worst headache of her life, she does get migraines that have been worse.  She has not taken anything for this.  She does have some vision changes that she says have been going on for the past couple months.  She has seen an eye doctor for this.  She also admits to some associated shortness of breath which started 2 days ago.  She denies any chest pain.  She denies any pain radiating down to her neck jaw or arm.  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  She denies any weakness, gait abnormalities, dizziness, fever, cough, chills, sore throat, congestion, sick contacts, has not been around anyone with Covid.  She  admits to some nausea when she has her headache, but has not vomited.  Normal stools.  She has been eating and drinking normally.  She states that she has been urinating more than often.  She reports having headaches when she has her blood sugars in the 600s, however this one has been a little worse. Patient states that her headache normally resolves after her sugars go down.   HPI     Past Medical History:  Diagnosis Date  . Acid reflux   . Diabetes mellitus without complication (HCC)    borderline  . Hyposomnia, insomnia or sleeplessness associated with anxiety   . Migraine     There are no problems to display for this patient.   Past Surgical History:  Procedure Laterality Date  . ABDOMINAL HYSTERECTOMY     2009  . CARPAL TUNNEL RELEASE Left   . FRACTURE SURGERY     jaw  . TUBAL LIGATION       OB History   No obstetric history on file.     History reviewed. No pertinent family history.  Social History   Tobacco Use  . Smoking status: Never Smoker  . Smokeless tobacco: Never Used  Substance Use Topics  . Alcohol use: No  . Drug use: No    Home Medications Prior to Admission medications   Medication Sig Start  Date End Date Taking? Authorizing Provider  ALPRAZolam Duanne Moron) 1 MG tablet Take 1 mg by mouth 2 (two) times daily as needed. anxiety 08/25/15   [provider]  benzonatate (TESSALON) 100 MG capsule Take 1 capsule (100 mg total) by mouth 3 (three) times daily as needed for cough. 01/10/19   Francine Graven, DO  HYDROcodone-acetaminophen (NORCO) 10-325 MG tablet Take 1 tablet by mouth every 6 (six) hours as needed. pain 08/23/15   [provider]  metFORMIN (GLUCOPHAGE) 500 MG tablet Take 1 tablet (500 mg total) by mouth 2 (two) times daily with a meal. 08/29/15   Gareth Morgan, MD  nabumetone (RELAFEN) 750 MG tablet Take 750 mg by mouth 2 (two) times daily as needed. Muscle spasm 05/29/15   [provider]  omeprazole (PRILOSEC)  20 MG capsule Take 20 mg by mouth 2 (two) times daily as needed. Acid reflux 05/31/15   [provider]  oseltamivir (TAMIFLU) 75 MG capsule Take 1 capsule (75 mg total) by mouth every 12 (twelve) hours. 01/10/19   Francine Graven, DO  sucralfate (CARAFATE) 1 G tablet Take 1 g by mouth 3 (three) times daily as needed. For stomach pain 05/31/15   [provider]    Allergies    Dapagliflozin, Dulaglutide, and Liraglutide  Review of Systems   Review of Systems  Eyes: Positive for visual disturbance.  Respiratory: Positive for shortness of breath.   Neurological: Positive for headaches.   .Ten systems are reviewed by me and are negative for acute changes, except as noted in the HPI.  Physical Exam Updated Vital Signs BP 139/82   Pulse 95   Temp 98.1 F (36.7 C)   Resp 19   Ht 5\' 3"  (1.6 m)   Wt 92.5 kg   SpO2 98%   BMI 36.14 kg/m   Physical Exam  PE: Constitutional: well-developed, well-nourished, no apparent distress HENT: normocephalic, atraumatic, throat without any exudate or swelling on tonsils  Eyes: EOMS intact, PERRLA, peripheral vision intact  Cardiovascular: normal rate and rhythm, distal pulses intact Pulmonary/Chest: effort normal; breath sounds clear and equal bilaterally; no wheezes or rales Abdominal: soft and nontender Musculoskeletal: full ROM, no edema Lymphadenopathy: no cervical adenopathy Neuro: Alert. Clear speech. No facial droop. CNIII-XII grossly intact. Bilateral upper and lower extremities' sensation grossly intact. 5/5 symmetric strength with grip strength and with plantar and dorsi flexion bilaterally. Normal finger to nose bilaterally. Negative pronator drift. Negative Romberg sign. Skin: warm and dry, no rash, no diaphoresis Psychiatric: normal mood and affect, normal behavior    ED Results / Procedures / Treatments   Labs (all labs ordered are listed, but only abnormal results are displayed) Labs Reviewed  CBG MONITORING, ED  - Abnormal; Notable for the following components:      Result Value   Glucose-Capillary >600 (*)    All other components within normal limits  CBC  BASIC METABOLIC PANEL  URINALYSIS, ROUTINE W REFLEX MICROSCOPIC  CBG MONITORING, ED    EKG None  Radiology No results found.  Procedures Procedures (including critical care time)  Medications Ordered in ED Medications  sodium chloride 0.9 % bolus 1,000 mL (has no administration in time range)    ED Course  I have reviewed the triage vital signs and the nursing notes.  Pertinent labs & imaging results that were available during my care of the patient were reviewed by me and considered in my medical decision making (see chart for details).  Clinical Course as of Feb 23 2213  Sat Feb 24, 2020  2211 Troponin I (High Sensitivity) [SP]    Clinical Course User Index [SP] Farrel Gordon, PA-C   MDM Rules/Calculators/A&P                     Lacey Bradshaw is a 49 y.o. female with a past medical history of type 2 diabetes on insulin, migraine that presents to the emergency department for elevated blood sugar and headache. Glucose 628 today in emergency room.  Patient states that she normally lives in the 500s for the past couple of months.  She is being followed by  Her PCP  for her diabetes.  She is medication compliant and has  been taking her insulin as prescribed.  We will treat hyperglycemia with fluids and insulin.  Normal labs ordered.  Will also order labs to rule out cardiac etiology to include EKG, troponins, chest x-ray.  Patient's headache is most likely due to her high sugars, will give Reglan, Benadryl, Tylenol and see if there is any relief from that.    There was no relief with headache cocktail therefore will order head CT to rule out other intracranial abnormalities.  She does have a an immediate onset headache which is different than her other headaches.  However per chart review patient does come in complaining of  headaches frequently.  Patient agreeable to head CT at this time.  Patient is not in DKA at this time, normal anion gap, normal pH.  I ordered, reviewed and interpreted labs which included stable CBC, sodium 128 however with correction factor is 141.  Vitals are stable. I ordered imaging studies to include chest x-ray and EKG and I independently reviewed and interpreted by me which were reassuring.  CT head showed no acute intracranial findings.  Blood glucose has dropped down to 305.  Patient to be discharged at this time.  She states that her head feels a lot better, which normally occurs after her sugars drop.  Discussed importance of seeing endocrinology, referred her to [facility]  endocrinology.  Patient agreeable.  Educated patient for strong ER return precautions.  First troponin negative, no need for second troponin at this time due to unlikelihood of ACS.  I discussed this case with PA-C Terance Hart including patient's presenting symptoms, physical exam, and planned diagnostics and interventions. She stated agreement with plan or made changes to plan which were implemented.    Final Clinical Impression(s) / ED Diagnoses Final diagnoses:  None    Rx / DC Orders ED Discharge Orders    None       Farrel Gordon, PA-C 02/25/20 1434    Vanetta Mulders, MD 03/04/20 [phone]

## 2020-02-24 NOTE — ED Triage Notes (Signed)
Pt c/o high blood sugar 500-600 since yesterday despite taking her meds. Including ss insulin  CBG HIGH in triage

## 2020-02-24 NOTE — Discharge Instructions (Addendum)
You were seen today for high blood sugar.  Your blood sugar was 628 when you arrived however it has gone down significantly in the emergency room with fluids and insulin.  Your head CT and other labs were all normal.  Come back to the emergency room if you start experiencing worsening symptoms, chest pain, shortness of breath, weakness, passing out.  Make sure you follow-up with your primary care.  Follow-up with Dr. Lucianne Muss, endocrinology, for your high blood sugar. Continue taking your diabetes medication as scheduled.

## 2020-03-31 IMAGING — DX DG CHEST 2V
2 series · 2 of 2 positions shown · non-contrast
Comparison: 01/05/2018

CLINICAL DATA: Chest pain, cough

EXAM:
CHEST - 2 VIEW

[chest pa]
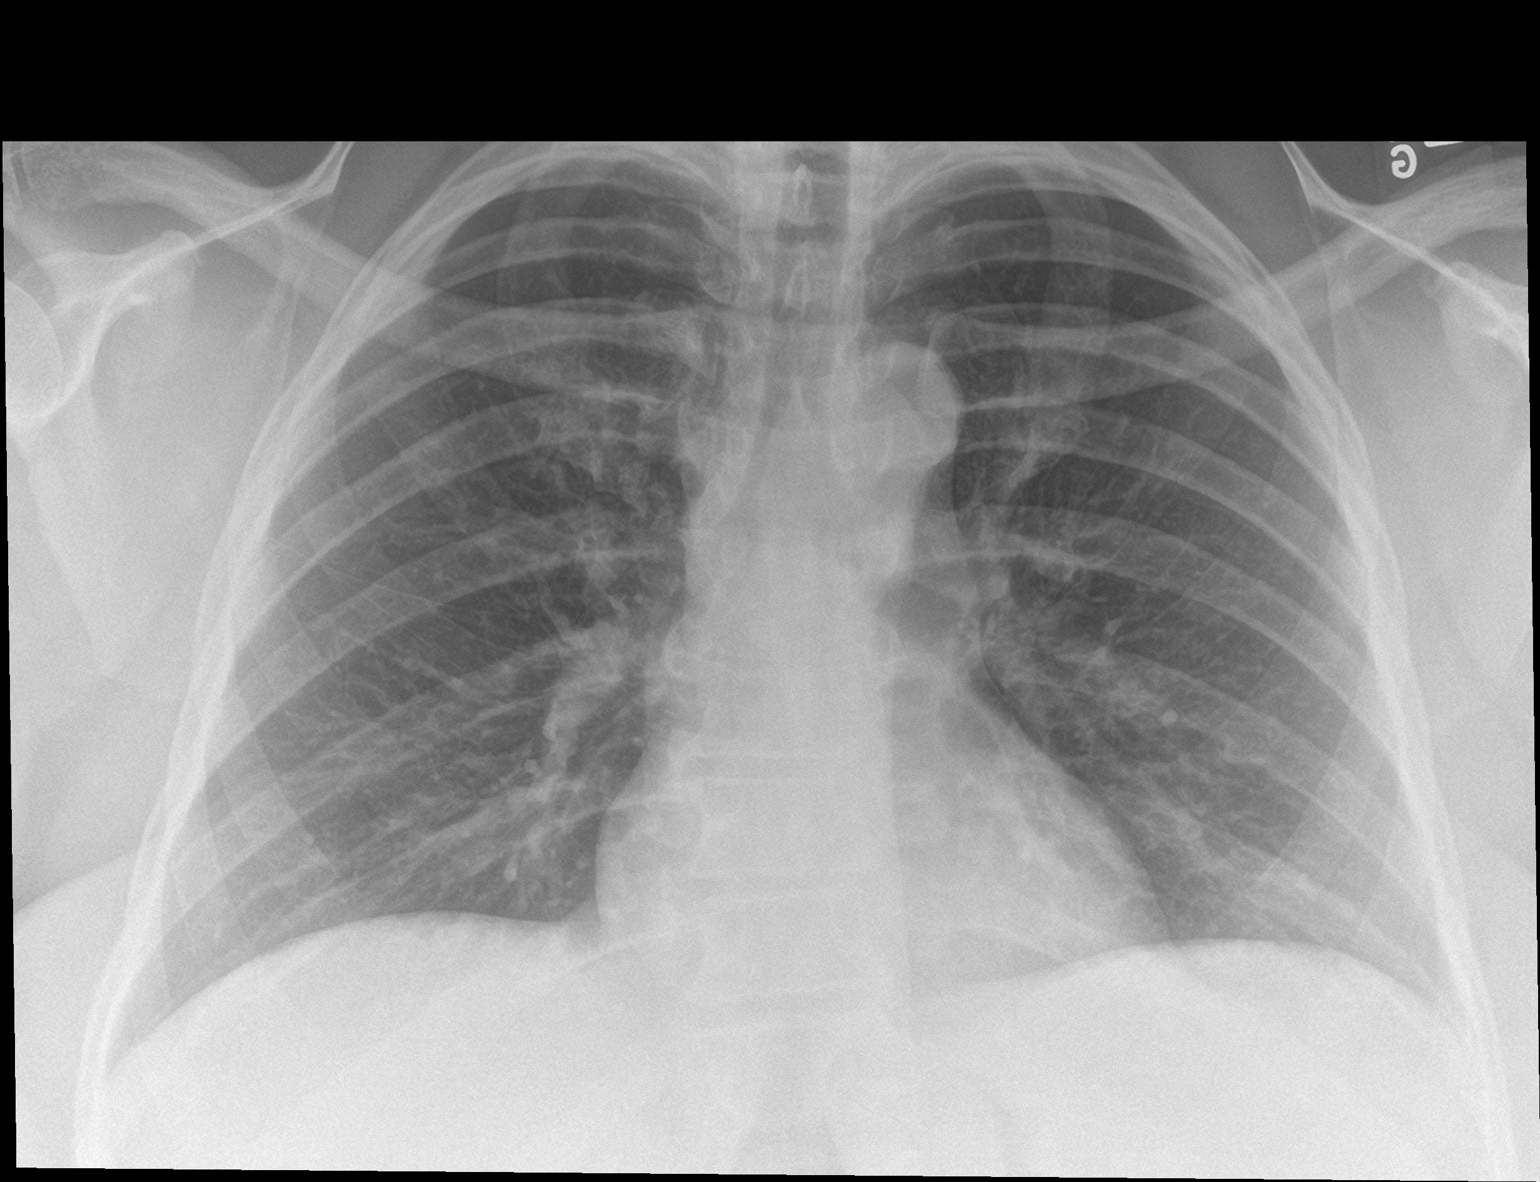

[chest lat]
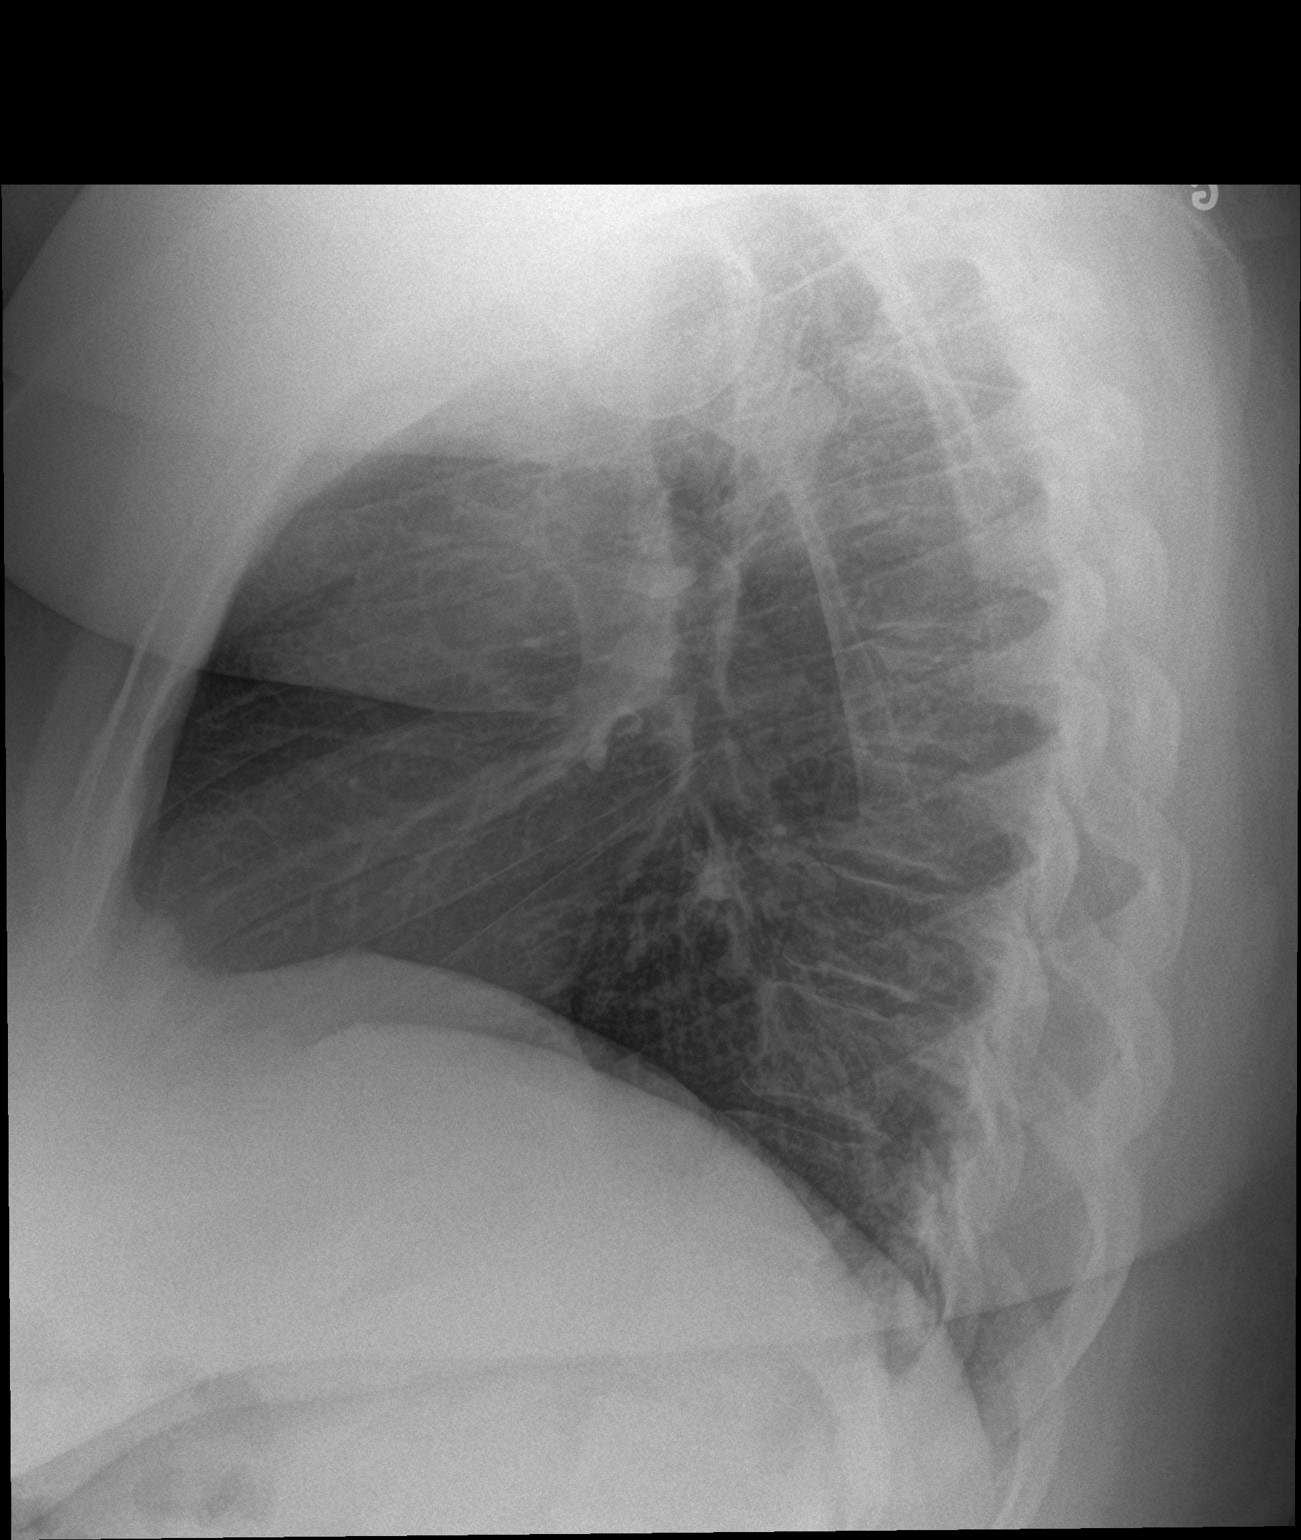

[2 of 2 positions shown; findings below may reference images not displayed]

FINDINGS: Heart and mediastinal contours are within normal limits. No focal
opacities or effusions. No acute bony abnormality.
IMPRESSION: No active cardiopulmonary disease.

## 2020-12-13 LAB — VITAMIN D 25 HYDROXY (VIT D DEFICIENCY, FRACTURES): Vit D, 25-Hydroxy: 13.4

## 2020-12-13 LAB — LIPID PANEL: LDL Cholesterol: 117

## 2020-12-13 LAB — MICROALBUMIN, URINE: Microalb, Ur: 11

## 2020-12-13 LAB — HEMOGLOBIN A1C: Hemoglobin A1C: 14.5

## 2020-12-16 LAB — COMPREHENSIVE METABOLIC PANEL: GFR calc Af Amer: 82

## 2020-12-24 ENCOUNTER — Other Ambulatory Visit: Payer: Self-pay

## 2020-12-24 ENCOUNTER — Encounter: Payer: Self-pay | Admitting: Nutrition

## 2020-12-24 ENCOUNTER — Encounter: Payer: Medicaid Other | Attending: Internal Medicine | Admitting: Nutrition

## 2020-12-24 VITALS — Ht 63.0 in | Wt 195.0 lb

## 2020-12-24 DIAGNOSIS — I10 Essential (primary) hypertension: Secondary | ICD-10-CM

## 2020-12-24 DIAGNOSIS — IMO0002 Reserved for concepts with insufficient information to code with codable children: Secondary | ICD-10-CM

## 2020-12-24 DIAGNOSIS — E1165 Type 2 diabetes mellitus with hyperglycemia: Secondary | ICD-10-CM | POA: Insufficient documentation

## 2020-12-24 DIAGNOSIS — E118 Type 2 diabetes mellitus with unspecified complications: Secondary | ICD-10-CM | POA: Insufficient documentation

## 2020-12-24 DIAGNOSIS — E782 Mixed hyperlipidemia: Secondary | ICD-10-CM

## 2020-12-24 NOTE — Patient Instructions (Signed)
Goals set by patient  Follow my Plate  Eat meals on time Eat 30-45 g of carbs per meal (2-3 choices) Blood sugars goals; before breakfast 80-130, Before bedtime 100-150 mg/dl. Test blood sugars before meals and bedtime. Take metformin after breakfast, not before. Keep drinking 100 oz of water Take  A MVI daily. Keep food journal and BS log and bring to appointments. Call PCP if BS are lower than 70 or greater than 300 mg/dl 3 times per week or in a row. Call if questions or concerns.

## 2020-12-24 NOTE — Progress Notes (Signed)
Medical Nutrition Therapy  Appointment Start time:  0800  Appointment End time:  0930  Primary concerns today: Type 2 Dm Referral diagnosis: E11.8 Preferred learning style: No preference Learning readiness:  Ready, changes in progress  NUTRITION ASSESSMENT  Current diet insuffient meet her needs and improve her diabetes. Diet is too restrictive in CHO and not enough fruits, vegetables and whole grains.  Anthropometrics  Wt Readings from Last 3 Encounters:  12/24/20 195 lb (88.5 kg)  02/24/20 204 lb (92.5 kg)  01/10/19 227 lb (103 kg)   Ht Readings from Last 3 Encounters:  12/24/20 5\' 3"  (1.6 m)  02/24/20 5\' 3"  (1.6 m)  01/10/19 5\' 3"  (1.6 m)   Body mass index is 34.54 kg/m. @[facility] @ Facility age limit for growth percentiles is 20 years. Facility age limit for growth percentiles is 20 years.  PCP Dr. office. She notes she last A1C was 14.4% LDL 114 mg/dl.    Clinical Medical Hx: Bulging disc, HTN and Hyperlipidemia, allergies, anxiety, arthritis  Medications: Levermir, Novolog, Glipizide, Metformin  Labs: A1C 14.4%. Notable Signs/Symptoms: increase thirst, frequent urination, extreme fatigue , muscle cramps, headaches, blurry vision  Lifestyle & Dietary Hx Unemployed. Lives with her grandson. She notes she has no energy and feels terrible. Eats most meals at home. Wants to feel better with blood sugars. Feels really fatigued and can't get enough to drink. Stays thirsty. Complains of leg cramps at night. Can't sleep some nights. ONly gets 4 hrs a sleep at times. Looking to start a new job in 2 weeks. Lost from 236 lbs to 205 lbs recently. Is drinking a lot of water.   Estimated daily fluid intake: 100 oz Supplements:  Sleep: 1 once a week, doesn't sleep at all. Only sleeps 4 hrs a night. Stress / self-care: has stress of her daughter who his the gransons mom. Current average weekly physical activity: ADL   24-Hr Dietary Recall First Meal: Oatmeal with  blackberrires, 1/2 tsp honey, cinnamon and 2 slices bacon, water Snack:  Second Meal: Tuna 4 oz.,inach leaves, cherry tomatoes, water Snack:  6 oz apple juice,unsweetened Third Meal: Cheeseburger no bun, onions, mushrooms, 5 oz, water, 4 fries, 16 oz Snack:  Popcorn 1 cup, chocolate covered strawberries. Beverages: water only,   Estimated Energy Needs Calories: 1500  Carbohydrate: 170g Protein: 94g Fat: 50g   NUTRITION DIAGNOSIS  NB-1.1 Food and nutrition-related knowledge deficit As related to Diabetes Type 2.  As evidenced by A1C 14.4%.   NUTRITION INTERVENTION  Nutrition education (E-1) on the following topics:  . Nutrition and Diabetes education provided on My Plate, CHO counting, meal planning, portion sizes, timing of meals, avoiding snacks between meals unless having a low blood sugar, target ranges for A1C and blood sugars, signs/symptoms and treatment of hyper/hypoglycemia, monitoring blood sugars, taking medications as prescribed, benefits of exercising 30 minutes per day and prevention of complications of DM. 03/12/19   Handouts Provided Include   Emailed My Plate, diabetes instructions 2/2 hypo/hyperglycemia., carb counting, high fiber diet,  meal planning.  Learning Style & Readiness for Change Teaching method utilized: Visual & Auditory  Demonstrated degree of understanding via: Teach Back  Barriers to learning/adherence to lifestyle change: none  Goals Established by Pt  Follow my Plate  Eat meals on time Eat 30-45 g of carbs per meal (2-3 choices) Blood sugars goals; before breakfast 80-130, Before bedtime 100-150 mg/dl. Test blood sugars before meals and bedtime. Take metformin after breakfast, not before. Keep drinking 100 oz of  water Take  A MVI daily. Keep food journal and BS log and bring to appointments. Call PCP if BS are lower than 70 or greater than 300 mg/dl 3 times per week or in a row. Call if questions or concerns.   MONITORING &  EVALUATION Dietary intake, weekly physical activity, and blood sugars  in 1 month..  Next Steps  Patient is to track food and blood sugars.Marland Kitchen

## 2020-12-25 ENCOUNTER — Ambulatory Visit (INDEPENDENT_AMBULATORY_CARE_PROVIDER_SITE_OTHER): Payer: PRIVATE HEALTH INSURANCE | Admitting: Nurse Practitioner

## 2020-12-25 ENCOUNTER — Encounter: Payer: Self-pay | Admitting: Nurse Practitioner

## 2020-12-25 VITALS — BP 110/72 | HR 90 | Ht 63.0 in | Wt 193.4 lb

## 2020-12-25 DIAGNOSIS — E782 Mixed hyperlipidemia: Secondary | ICD-10-CM | POA: Diagnosis not present

## 2020-12-25 DIAGNOSIS — I10 Essential (primary) hypertension: Secondary | ICD-10-CM

## 2020-12-25 DIAGNOSIS — Z794 Long term (current) use of insulin: Secondary | ICD-10-CM

## 2020-12-25 DIAGNOSIS — E1165 Type 2 diabetes mellitus with hyperglycemia: Secondary | ICD-10-CM | POA: Diagnosis not present

## 2020-12-25 MED ORDER — METFORMIN HCL ER 500 MG PO TB24: 1000.0000 mg | ORAL_TABLET | Freq: Every day | ORAL | 3 refills | Status: DC

## 2020-12-25 NOTE — Patient Instructions (Signed)
Diabetes Mellitus and Nutrition, Adult When you have diabetes, or diabetes mellitus, it is very important to have healthy eating habits because your blood sugar (glucose) levels are greatly affected by what you eat and drink. Eating healthy foods in the right amounts, at about the same times every day, can help you:  Control your blood glucose.  Lower your risk of heart disease.  Improve your blood pressure.  Reach or maintain a healthy weight. What can affect my meal plan? Every person with diabetes is different, and each person has different needs for a meal plan. Your health care provider may recommend that you work with a dietitian to make a meal plan that is best for you. Your meal plan may vary depending on factors such as:  The calories you need.  The medicines you take.  Your weight.  Your blood glucose, blood pressure, and cholesterol levels.  Your activity level.  Other health conditions you have, such as heart or kidney disease. How do carbohydrates affect me? Carbohydrates, also called carbs, affect your blood glucose level more than any other type of food. Eating carbs naturally raises the amount of glucose in your blood. Carb counting is a method for keeping track of how many carbs you eat. Counting carbs is important to keep your blood glucose at a healthy level, especially if you use insulin or take certain oral diabetes medicines. It is important to know how many carbs you can safely have in each meal. This is different for every person. Your dietitian can help you calculate how many carbs you should have at each meal and for each snack. How does alcohol affect me? Alcohol can cause a sudden decrease in blood glucose (hypoglycemia), especially if you use insulin or take certain oral diabetes medicines. Hypoglycemia can be a life-threatening condition. Symptoms of hypoglycemia, such as sleepiness, dizziness, and confusion, are similar to symptoms of having too much  alcohol.  Do not drink alcohol if: ? Your health care provider tells you not to drink. ? You are pregnant, may be pregnant, or are planning to become pregnant.  If you drink alcohol: ? Do not drink on an empty stomach. ? Limit how much you use to:  0-1 drink a day for women.  0-2 drinks a day for men. ? Be aware of how much alcohol is in your drink. In the [place] one drink equals one 12 oz bottle of beer (355 mL), one 5 oz glass of wine (148 mL), or one 1 oz glass of hard liquor (44 mL). ? Keep yourself hydrated with water, diet soda, or unsweetened iced tea.  Keep in mind that regular soda, juice, and other mixers may contain a lot of sugar and must be counted as carbs. What are tips for following this plan? Reading food labels  Start by checking the serving size on the "Nutrition Facts" label of packaged foods and drinks. The amount of calories, carbs, fats, and other nutrients listed on the label is based on one serving of the item. Many items contain more than one serving per package.  Check the total grams (g) of carbs in one serving. You can calculate the number of servings of carbs in one serving by dividing the total carbs by 15. For example, if a food has 30 g of total carbs per serving, it would be equal to 2 servings of carbs.  Check the number of grams (g) of saturated fats and trans fats in one serving. Choose foods that have   a low amount or none of these fats.  Check the number of milligrams (mg) of salt (sodium) in one serving. Most people should limit total sodium intake to less than 2,300 mg per day.  Always check the nutrition information of foods labeled as "low-fat" or "nonfat." These foods may be higher in added sugar or refined carbs and should be avoided.  Talk to your dietitian to identify your daily goals for nutrients listed on the label. Shopping  Avoid buying canned, pre-made, or processed foods. These foods tend to be high in fat, sodium, and added  sugar.  Shop around the outside edge of the grocery store. This is where you will most often find fresh fruits and vegetables, bulk grains, fresh meats, and fresh dairy. Cooking  Use low-heat cooking methods, such as baking, instead of high-heat cooking methods like deep frying.  Cook using healthy oils, such as olive, canola, or sunflower oil.  Avoid cooking with butter, cream, or high-fat meats. Meal planning  Eat meals and snacks regularly, preferably at the same times every day. Avoid going long periods of time without eating.  Eat foods that are high in fiber, such as fresh fruits, vegetables, beans, and whole grains. Talk with your dietitian about how many servings of carbs you can eat at each meal.  Eat 4-6 oz (112-168 g) of lean protein each day, such as lean meat, chicken, fish, eggs, or tofu. One ounce (oz) of lean protein is equal to: ? 1 oz (28 g) of meat, chicken, or fish. ? 1 egg. ?  cup (62 g) of tofu.  Eat some foods each day that contain healthy fats, such as avocado, nuts, seeds, and fish.   What foods should I eat? Fruits Berries. Apples. Oranges. Peaches. Apricots. Plums. Grapes. Mango. Papaya. Pomegranate. Kiwi. Cherries. Vegetables Lettuce. Spinach. Leafy greens, including kale, chard, collard greens, and mustard greens. Beets. Cauliflower. Cabbage. Broccoli. Carrots. Green beans. Tomatoes. Peppers. Onions. Cucumbers. Brussels sprouts. Grains Whole grains, such as whole-wheat or whole-grain bread, crackers, tortillas, cereal, and pasta. Unsweetened oatmeal. Quinoa. Brown or wild rice. Meats and other proteins Seafood. Poultry without skin. Lean cuts of poultry and beef. Tofu. Nuts. Seeds. Dairy Low-fat or fat-free dairy products such as milk, yogurt, and cheese. The items listed above may not be a complete list of foods and beverages you can eat. Contact a dietitian for more information. What foods should I avoid? Fruits Fruits canned with  syrup. Vegetables Canned vegetables. Frozen vegetables with butter or cream sauce. Grains Refined white flour and flour products such as bread, pasta, snack foods, and cereals. Avoid all processed foods. Meats and other proteins Fatty cuts of meat. Poultry with skin. Breaded or fried meats. Processed meat. Avoid saturated fats. Dairy Full-fat yogurt, cheese, or milk. Beverages Sweetened drinks, such as soda or iced tea. The items listed above may not be a complete list of foods and beverages you should avoid. Contact a dietitian for more information. Questions to ask a health care provider  Do I need to meet with a diabetes educator?  Do I need to meet with a dietitian?  What number can I call if I have questions?  When are the best times to check my blood glucose? Where to find more information:  American Diabetes Association: diabetes.org  Academy of Nutrition and Dietetics: www.eatright.org  National Institute of Diabetes and Digestive and Kidney Diseases: www.niddk.nih.gov  Association of Diabetes Care and Education Specialists: www.diabeteseducator.org Summary  It is important to have healthy eating   habits because your blood sugar (glucose) levels are greatly affected by what you eat and drink.  A healthy meal plan will help you control your blood glucose and maintain a healthy lifestyle.  Your health care provider may recommend that you work with a dietitian to make a meal plan that is best for you.  Keep in mind that carbohydrates (carbs) and alcohol have immediate effects on your blood glucose levels. It is important to count carbs and to use alcohol carefully. This information is not intended to replace advice given to you by your health care provider. Make sure you discuss any questions you have with your health care provider. Document Revised: 10/03/2019 Document Reviewed: 10/03/2019 [employer]vier Patient Education  2021 [employer]  

## 2020-12-25 NOTE — Progress Notes (Signed)
Endocrinology Consult Note       12/25/2020, 11:48 AM   Subjective:    Patient ID: Gilford Rile, female    DOB: [birth year].  [name] is being seen in consultation for management of currently uncontrolled symptomatic diabetes requested by  Benita Stabile, MD.   Past Medical History:  Diagnosis Date  . Acid reflux   . Diabetes mellitus without complication (HCC)    borderline  . Diabetes mellitus, type II (HCC)   . Hyperlipidemia   . Hyposomnia, insomnia or sleeplessness associated with anxiety   . Migraine     Past Surgical History:  Procedure Laterality Date  . ABDOMINAL HYSTERECTOMY     2009  . CARPAL TUNNEL RELEASE Left   . FRACTURE SURGERY     jaw  . TUBAL LIGATION      Social History   Socioeconomic History  . Marital status: Single    Spouse name: Not on file  . Number of children: Not on file  . Years of education: Not on file  . Highest education level: Not on file  Occupational History  . Not on file  Tobacco Use  . Smoking status: Never Smoker  . Smokeless tobacco: Never Used  Vaping Use  . Vaping Use: Never used  Substance and Sexual Activity  . Alcohol use: No  . Drug use: No  . Sexual activity: Not on file  Other Topics Concern  . Not on file  Social History Narrative  . Not on file   Social Determinants of Health   Financial Resource Strain: Not on file  Food Insecurity: Not on file  Transportation Needs: Not on file  Physical Activity: Not on file  Stress: Not on file  Social Connections: Not on file    Family History  Problem Relation Age of Onset  . Cancer Mother   . Hypertension Father   . High Cholesterol Father   . Stroke Father   . Heart attack Father     Outpatient Encounter Medications as of 12/25/2020  Medication Sig  . ALPRAZolam (XANAX) 1 MG tablet Take 1 mg by mouth 2 (two) times daily as needed. anxiety  . BD VEO INSULIN SYRINGE U/F  31G X 15/64" 0.3 ML MISC ONE SYRINGE PER INSULIN USE  . cetirizine (ZYRTEC) 10 MG tablet Take 10 mg by mouth daily.  Marland Kitchen desvenlafaxine (PRISTIQ) 50 MG 24 hr tablet Take 50 mg by mouth daily.  Marland Kitchen glipiZIDE (GLUCOTROL) 10 MG tablet Take 5 mg by mouth 2 (two) times daily.  Marland Kitchen HYDROcodone-acetaminophen (NORCO) 10-325 MG tablet Take 1 tablet by mouth every 6 (six) hours as needed for moderate pain or severe pain.  Marland Kitchen LEVEMIR FLEXTOUCH 100 UNIT/ML FlexPen Inject 70 Units into the skin at bedtime.  Marland Kitchen lisinopril (ZESTRIL) 5 MG tablet Take 5 mg by mouth daily.  . metFORMIN (GLUCOPHAGE XR) 500 MG 24 hr tablet Take 2 tablets (1,000 mg total) by mouth daily with breakfast.  . NOVOLOG 100 UNIT/ML injection Inject 10-16 Units into the skin with breakfast, with lunch, and with evening meal.  . nystatin cream (MYCOSTATIN) Apply 1 application topically 2 (two) times daily.  . pantoprazole (PROTONIX) 40 MG tablet  Take 40 mg by mouth 2 (two) times daily.  . pregabalin (LYRICA) 100 MG capsule Take 100 mg by mouth 3 (three) times daily.  . simvastatin (ZOCOR) 40 MG tablet Take 40 mg by mouth at bedtime.  . [DISCONTINUED] metFORMIN (GLUCOPHAGE) 1000 MG tablet Take 1,000 mg by mouth 2 (two) times daily.  . [DISCONTINUED] omeprazole (PRILOSEC) 20 MG capsule Take 20 mg by mouth 2 (two) times daily as needed. Acid reflux   No facility-administered encounter medications on file as of 12/25/2020.    ALLERGIES: Allergies  Allergen Reactions  . Dapagliflozin Other (See Comments)    Nausea and vomiting  . Dulaglutide     Other reaction(s): Makes her feel terrible.  . Liraglutide     Other reaction(s): burping & belching    VACCINATION STATUS:  There is no immunization history on file for this patient.  Diabetes She presents for her initial diabetic visit. She has type 2 diabetes mellitus. Onset time: diagnosed at approx age of 32. Her disease course has been worsening. Associated symptoms include blurred vision,  fatigue, foot paresthesias, polydipsia, polyphagia, polyuria, visual change and weight loss. There are no hypoglycemic complications. Symptoms are worsening. Diabetic complications include peripheral neuropathy and retinopathy. Risk factors for coronary artery disease include diabetes mellitus, dyslipidemia, hypertension, obesity, sedentary lifestyle and stress. Current diabetic treatment includes intensive insulin program and oral agent (dual therapy). She is compliant with treatment some of the time. Her weight is decreasing steadily. She is following a generally healthy diet. Meal planning includes avoidance of concentrated sweets. She has had a previous visit with a dietitian. She participates in exercise three times a week. Her breakfast blood glucose range is generally >200 mg/dl. Her lunch blood glucose range is generally >200 mg/dl. Her dinner blood glucose range is generally >200 mg/dl. Her bedtime blood glucose range is generally >200 mg/dl. Her overall blood glucose range is >200 mg/dl. (She presents today for her consultation with her meter, no logs showing persistently high glycemic profile.  Her most recent A1c on 12/13/20 was 14.5%.  She reports she drinks mostly water and frequently skips lunch.  She spoke with Norm Salt, RDE yesterday and has already implemented healthier diet/eating pattern.  She is also starting to do home workouts and walk more now.  She has not been taking her medication as prescribed due to cramping (she will take less insulin depending on what her glucose readings are).  There are no episodes of hypoglycemia reported.) An ACE inhibitor/angiotensin II receptor blocker is being taken. She does not see a podiatrist.Eye exam is current (has appt for f/u next week).  Hyperlipidemia This is a chronic problem. The current episode started more than 1 year ago. The problem is uncontrolled. Recent lipid tests were reviewed and are high. Exacerbating diseases include chronic renal  disease, diabetes and obesity. Factors aggravating her hyperlipidemia include fatty foods. Current antihyperlipidemic treatment includes statins. The current treatment provides mild improvement of lipids. Compliance problems include adherence to exercise and adherence to diet.  Risk factors for coronary artery disease include diabetes mellitus, dyslipidemia, hypertension, obesity and stress.  Hypertension This is a chronic problem. The current episode started more than 1 year ago. The problem has been resolved since onset. The problem is controlled. Associated symptoms include blurred vision. There are no associated agents to hypertension. Risk factors for coronary artery disease include diabetes mellitus, dyslipidemia, stress and obesity. Past treatments include ACE inhibitors. The current treatment provides significant improvement. There are no compliance problems.  Hypertensive end-organ damage includes kidney disease and retinopathy. Identifiable causes of hypertension include chronic renal disease.     Review of systems  Constitutional: + steadily decreasing body weight,  current Body mass index is 34.26 kg/m. , + fatigue, no subjective hyperthermia, no subjective hypothermia Eyes: + blurry vision, no xerophthalmia ENT: no sore throat, no nodules palpated in throat, no dysphagia/odynophagia, no hoarseness Cardiovascular: no chest pain, no shortness of breath, no palpitations, no leg swelling Respiratory: no cough, no shortness of breath Gastrointestinal: no nausea/vomiting/diarrhea Musculoskeletal: no muscle/joint aches, + intermittent leg cramps Skin: no rashes, no hyperemia Neurological: no tremors, no dizziness, + numbness/tingling to BLE Psychiatric: no depression, no anxiety, reports increased stress  Objective:     BP 110/72 (BP Location: Left Arm, Patient Position: Sitting)   Pulse 90   Ht 5\' 3"  (1.6 m)   Wt 193 lb 6.4 oz (87.7 kg)   BMI 34.26 kg/m   Wt Readings from Last 3  Encounters:  12/25/20 193 lb 6.4 oz (87.7 kg)  12/24/20 195 lb (88.5 kg)  02/24/20 204 lb (92.5 kg)     BP Readings from Last 3 Encounters:  12/25/20 110/72  02/24/20 123/68  01/10/19 (!) 114/47     Physical Exam- Limited  Constitutional:  Body mass index is 34.26 kg/m. , not in acute distress, normal state of mind Eyes:  EOMI, no exophthalmos Neck: Supple Cardiovascular: RRR, no murmers, rubs, or gallops, no edema Respiratory: Adequate breathing efforts, no crackles, rales, rhonchi, or wheezing Musculoskeletal: no gross deformities, strength intact in all four extremities, no gross restriction of joint movements Skin:  no rashes, no hyperemia Neurological: no tremor with outstretched hands    CMP ( most recent) CMP     Component Value Date/Time   NA 128 (L) 02/24/2020 1920   K 4.5 02/24/2020 1920   CL 95 (L) 02/24/2020 1920   CO2 23 02/24/2020 1920   GLUCOSE 628 (HH) 02/24/2020 1920   BUN 13 02/24/2020 1920   CREATININE 1.06 (H) 02/24/2020 1920   CALCIUM 8.6 (L) 02/24/2020 1920   GFRNONAA >60 02/24/2020 1920   GFRAA 82 12/13/2020 0000     Diabetic Labs (most recent): Lab Results  Component Value Date   HGBA1C 14.5 12/13/2020     Lipid Panel ( most recent) Lipid Panel     Component Value Date/Time   LDLCALC 117 12/13/2020 0000      No results found for: TSH, FREET4         Assessment & Plan:   1. Type 2 diabetes mellitus with hyperglycemia, with long-term current use of insulin (HCC)  - [place] has currently uncontrolled symptomatic type 2 DM since 50 years of age, with most recent A1c of 14.5 %.   She presents today for her consultation with her meter, no logs showing persistently high glycemic profile.  Her most recent A1c on 12/13/20 was 14.5%.  She reports she drinks mostly water and frequently skips lunch.  She spoke with Norm SaltPenny Crumpton, RDE yesterday and has already implemented healthier diet/eating pattern.  She is also starting to do  home workouts and walk more now.  She has not been taking her medication as prescribed due to cramping (she will take less insulin depending on what her glucose readings are).  There are no episodes of hypoglycemia reported.  -Recent labs reviewed.  - I had a long discussion with her about the progressive nature of diabetes and the pathology behind its complications. -her diabetes is complicated  by CKD, retinopathy and neuropathy and she remains at a high risk for more acute and chronic complications which include CAD, CVA. These are all discussed in detail with her.  - I have counseled her on diet  and weight management by adopting a carbohydrate restricted/protein rich diet. Patient is encouraged to switch to unprocessed or minimally processed complex starch and increased protein intake (animal or plant source), fruits, and vegetables. -  she is advised to stick to a routine mealtimes to eat 3 meals a day and avoid unnecessary snacks (to snack only to correct hypoglycemia).   - she acknowledges that there is a room for improvement in her food and drink choices. - Suggestion is made for her to avoid simple carbohydrates  from her diet including Cakes, Sweet Desserts, Ice Cream, Soda (diet and regular), Sweet Tea, Candies, Chips, Cookies, Store Bought Juices, Alcohol in Excess of  1-2 drinks a day, Artificial Sweeteners, Coffee Creamer, and "Sugar-free" Products. This will help patient to have more stable blood glucose profile and potentially avoid unintended weight gain.  - she has already seen Norm Salt, RDN, CDE for diabetes education.  - I have approached her with the following individualized plan to manage  her diabetes and patient agrees:   -She is advised to change how she takes her Levemir to 70 units SQ nightly, change her Novolog to 10-16 units TID with meals if glucose is above 90 and she is eating.  Specific instructions on how to titrate insulin dose based on glucose readings given  to patient in writing.  She demonstrated her understanding on how to use SSI at home.    -she is encouraged to start monitoring glucose 4 times daily, before meals and before bed, to log their readings on the clinic sheets provided, and bring them to review at follow up appointment in 2 weeks.  - she is warned not to take insulin without proper monitoring per orders.  - Adjustment parameters are given to her for hypo and hyperglycemia in writing.  - she is encouraged to call clinic for blood glucose levels less than 70 or above 300 mg /dl.  - she is advised to continue Metformin (but change to 1000 mg ER daily with breakfast due to GI concerns), therapeutically suitable for patient and lower her dose of Glipizide to 5 mg po twice daily with meals.  - she will be considered for incretin therapy as appropriate next visit.  - Specific targets for  A1c;  LDL, HDL,  and Triglycerides were discussed with the patient.  2) Blood Pressure /Hypertension:  her blood pressure is controlled to target.   she is advised to continue her current medications including Lisinopril 5 mg p.o. daily with breakfast.  3) Lipids/Hyperlipidemia:    Review of her recent lipid panel from 12/13/20 showed uncontrolled  LDL at 117 .  she  is advised to continue Simvastatin 40 mg daily at bedtime.  Side effects and precautions discussed with her.  4)  Weight/Diet:  her Body mass index is 34.26 kg/m.  -  clearly complicating her diabetes care.   she is a candidate for weight loss. I discussed with her the fact that loss of 5 - 10% of her  current body weight will have the most impact on her diabetes management.  Exercise, and detailed carbohydrates information provided  -  detailed on discharge instructions.  5) Vitamin D Deficiency: Her most recent vitamin D level was 13.4 on 12/13/20.  She does not appear  to be on any supplementation.  Will discuss replacement options with her when she returns for her follow up visit in 2  weeks.  6) Chronic Care/Health Maintenance: -she is on ACEI/ARB and Statin medications and is encouraged to initiate and continue to follow up with Ophthalmology, Dentist, Podiatrist at least yearly or according to recommendations, and advised to stay away from smoking. I have recommended yearly flu vaccine and pneumonia vaccine at least every 5 years; moderate intensity exercise for up to 150 minutes weekly; and sleep for at least 7 hours a day.  - she is advised to maintain close follow up with Benita Stabile, MD for primary care needs, as well as her other providers for optimal and coordinated care.   - Time spent in this patient care: 60 min, of which > 50% was spent in counseling her about her diabetes and the rest reviewing her blood glucose logs, discussing her hypoglycemia and hyperglycemia episodes, reviewing her current and previous labs/studies (including abstraction from other facilities) and medications doses and developing a long term treatment plan based on the latest standards of care/guidelines; and documenting her care.    Please refer to Patient Instructions for Blood Glucose Monitoring and Insulin/Medications Dosing Guide" in media tab for additional information. Please also refer to "Patient Self Inventory" in the Media tab for reviewed elements of pertinent patient history.  [name] participated in the discussions, expressed understanding, and voiced agreement with the above plans.  All questions were answered to her satisfaction. she is encouraged to contact clinic should she have any questions or concerns prior to her return visit.   Follow up plan: - Return in about 2 weeks (around 01/08/2021) for Diabetes follow up, Bring glucometer and logs, ABI next visit, No previsit labs.  Ronny Bacon, [facility] [address] [place], [place] [address] Phone: [phone] Fax: [phone]  12/25/2020, 11:48 AM

## 2021-01-09 ENCOUNTER — Ambulatory Visit: Payer: Medicaid Other | Admitting: Nurse Practitioner

## 2021-01-13 LAB — HM DIABETES EYE EXAM

## 2021-01-21 ENCOUNTER — Ambulatory Visit: Payer: Medicaid Other | Admitting: Nutrition

## 2021-03-21 DIAGNOSIS — K219 Gastro-esophageal reflux disease without esophagitis: Secondary | ICD-10-CM | POA: Insufficient documentation

## 2021-03-21 DIAGNOSIS — E782 Mixed hyperlipidemia: Secondary | ICD-10-CM | POA: Insufficient documentation

## 2021-03-21 DIAGNOSIS — G43909 Migraine, unspecified, not intractable, without status migrainosus: Secondary | ICD-10-CM | POA: Insufficient documentation

## 2021-03-21 DIAGNOSIS — E119 Type 2 diabetes mellitus without complications: Secondary | ICD-10-CM | POA: Insufficient documentation

## 2021-04-22 ENCOUNTER — Telehealth: Payer: Self-pay

## 2021-04-22 NOTE — Telephone Encounter (Signed)
Received Medical Records Request for Disability. Sent to [facility] and to [facility]

## 2021-05-15 IMAGING — DX DG CHEST 2V
2 series · 2 of 2 positions shown · non-contrast
Comparison: 11/11/2019

CLINICAL DATA: Hyperglycemia

EXAM:
CHEST - 2 VIEW

[chest pa]
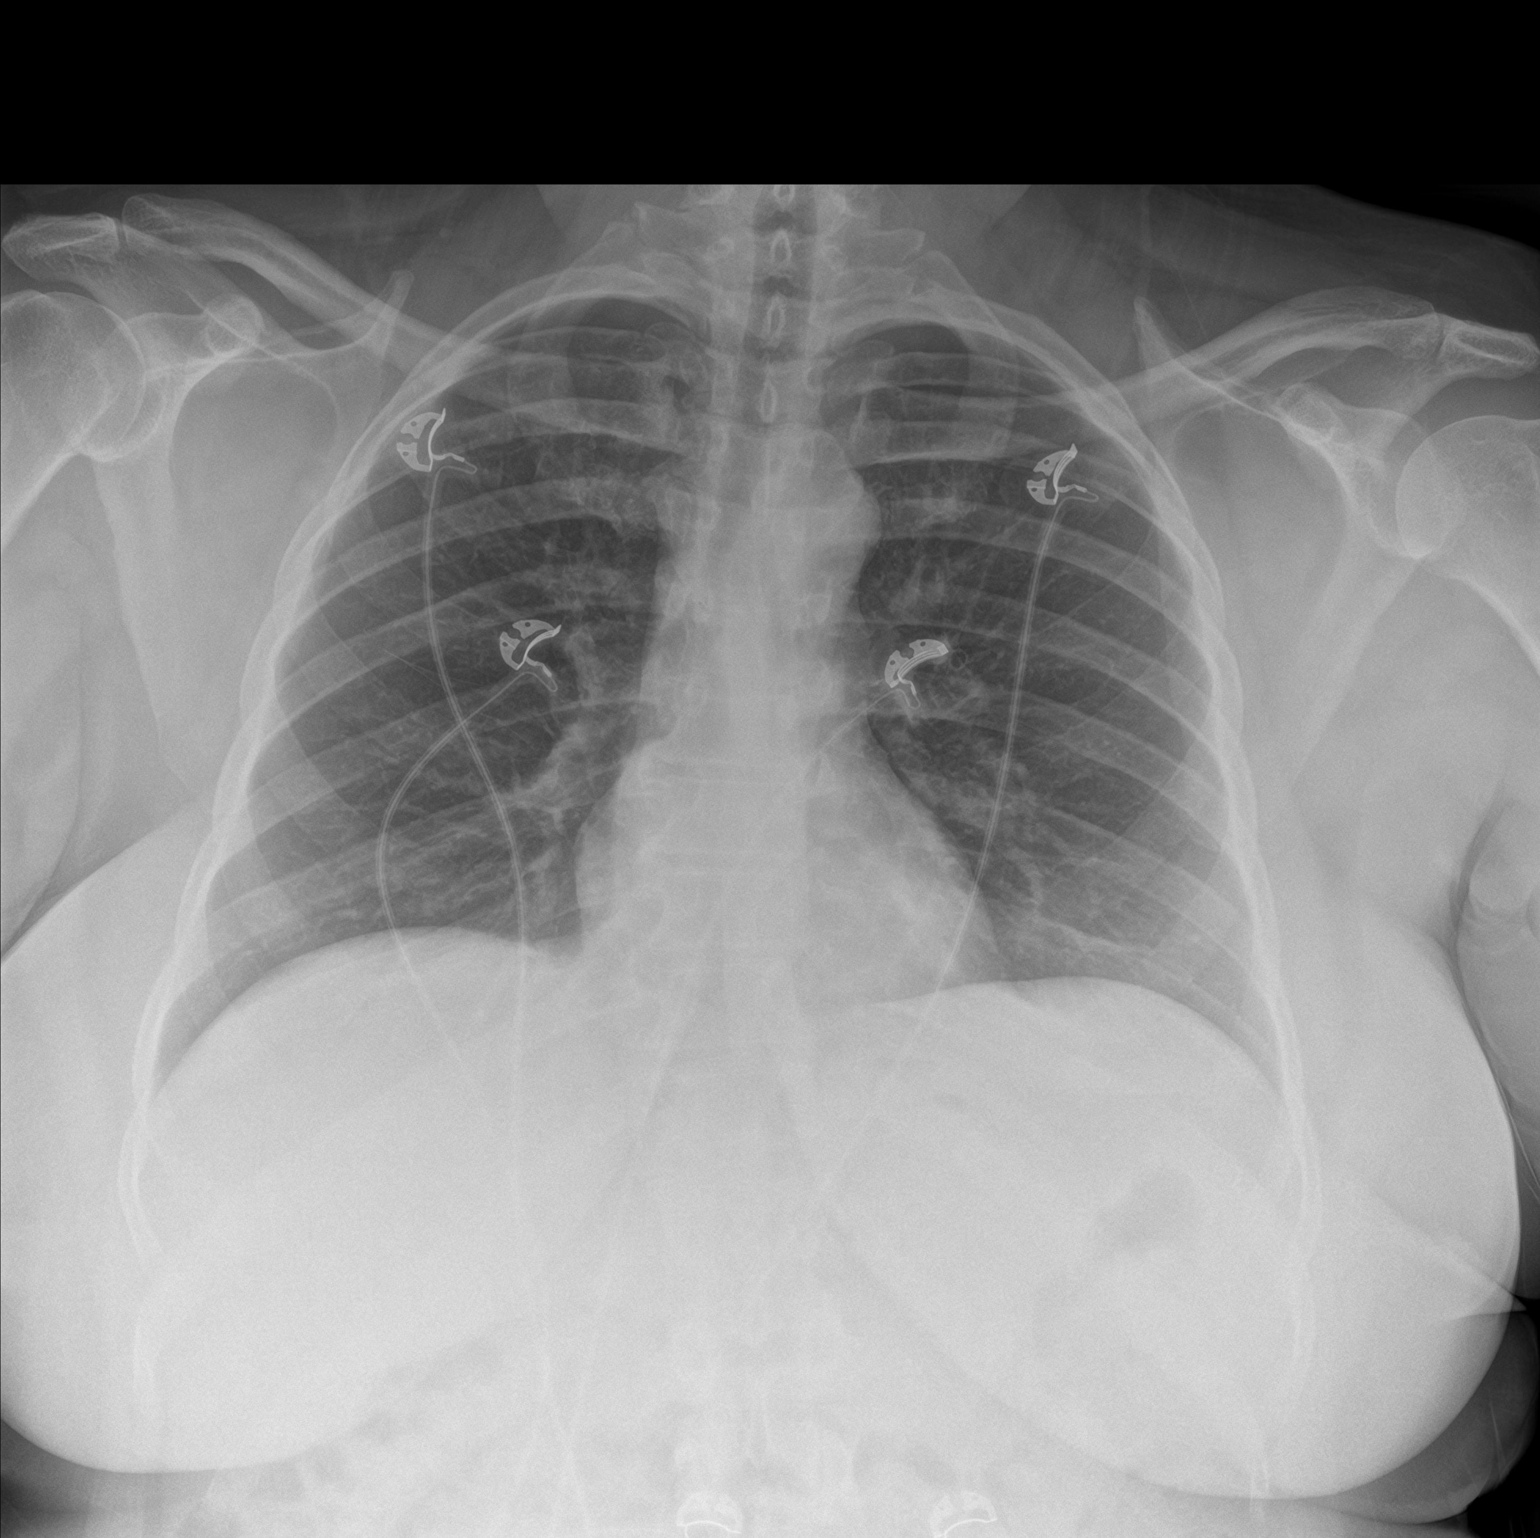

[chest lat]
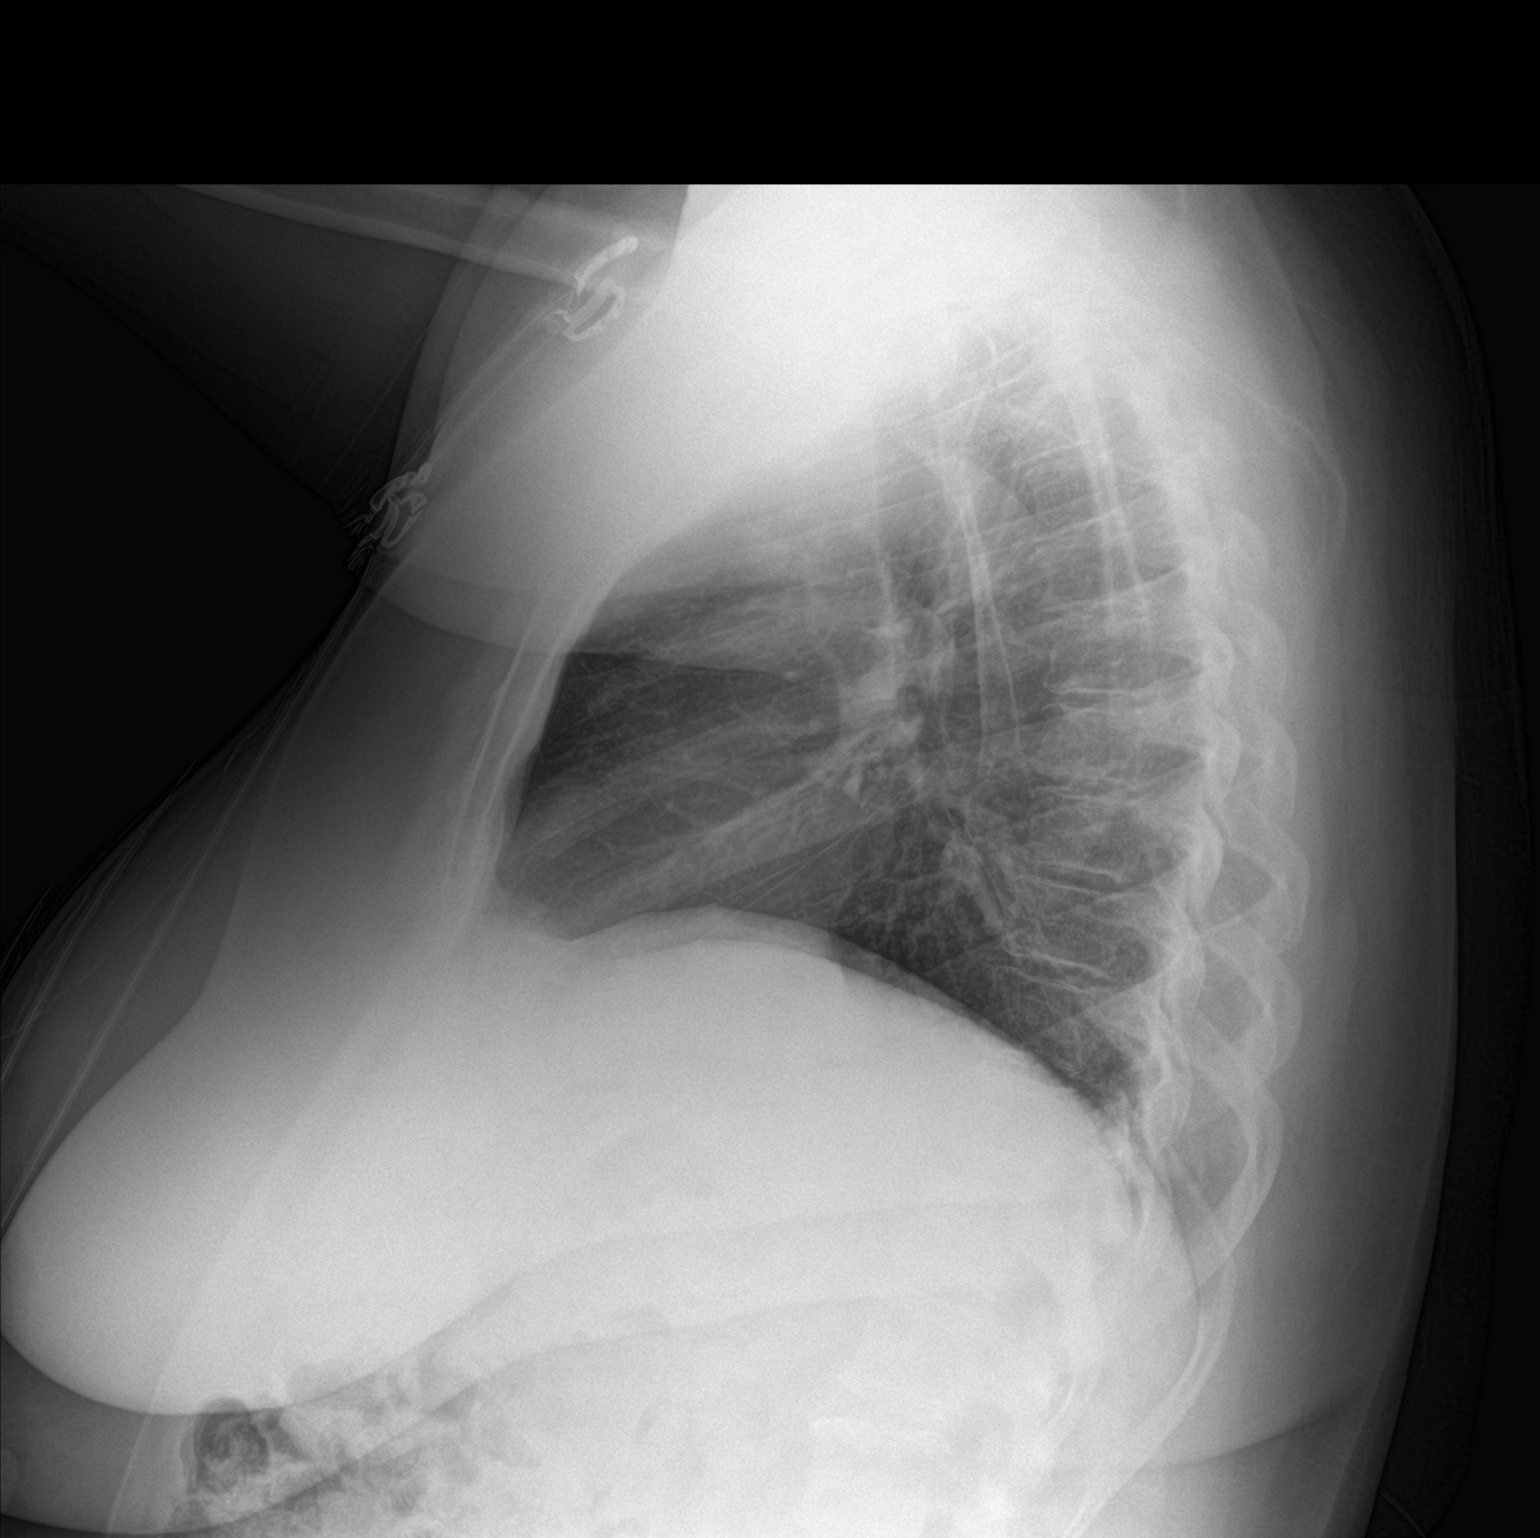

[2 of 2 positions shown; findings below may reference images not displayed]

FINDINGS: The heart size and mediastinal contours are within normal limits.
Both lungs are clear. The visualized skeletal structures are
unremarkable.
IMPRESSION: No active cardiopulmonary disease.

## 2021-06-28 DIAGNOSIS — H269 Unspecified cataract: Secondary | ICD-10-CM | POA: Insufficient documentation

## 2021-08-15 DIAGNOSIS — L259 Unspecified contact dermatitis, unspecified cause: Secondary | ICD-10-CM | POA: Insufficient documentation

## 2021-08-15 DIAGNOSIS — F5104 Psychophysiologic insomnia: Secondary | ICD-10-CM | POA: Insufficient documentation

## 2021-08-15 DIAGNOSIS — R42 Dizziness and giddiness: Secondary | ICD-10-CM | POA: Insufficient documentation

## 2021-08-15 DIAGNOSIS — H539 Unspecified visual disturbance: Secondary | ICD-10-CM | POA: Insufficient documentation

## 2021-08-15 DIAGNOSIS — R55 Syncope and collapse: Secondary | ICD-10-CM | POA: Insufficient documentation

## 2021-08-15 DIAGNOSIS — G629 Polyneuropathy, unspecified: Secondary | ICD-10-CM | POA: Insufficient documentation

## 2021-11-14 DIAGNOSIS — I10 Essential (primary) hypertension: Secondary | ICD-10-CM | POA: Diagnosis not present

## 2021-11-20 DIAGNOSIS — G894 Chronic pain syndrome: Secondary | ICD-10-CM | POA: Insufficient documentation

## 2021-12-17 DIAGNOSIS — W08XXXA Fall from other furniture, initial encounter: Secondary | ICD-10-CM | POA: Diagnosis not present

## 2021-12-17 DIAGNOSIS — N3001 Acute cystitis with hematuria: Secondary | ICD-10-CM | POA: Diagnosis not present

## 2021-12-17 DIAGNOSIS — M545 Low back pain, unspecified: Secondary | ICD-10-CM | POA: Diagnosis not present

## 2021-12-17 DIAGNOSIS — M797 Fibromyalgia: Secondary | ICD-10-CM | POA: Diagnosis not present

## 2021-12-17 DIAGNOSIS — M4316 Spondylolisthesis, lumbar region: Secondary | ICD-10-CM | POA: Diagnosis not present

## 2021-12-17 DIAGNOSIS — S161XXA Strain of muscle, fascia and tendon at neck level, initial encounter: Secondary | ICD-10-CM | POA: Diagnosis not present

## 2021-12-17 DIAGNOSIS — S39012A Strain of muscle, fascia and tendon of lower back, initial encounter: Secondary | ICD-10-CM | POA: Diagnosis not present

## 2021-12-17 DIAGNOSIS — M542 Cervicalgia: Secondary | ICD-10-CM | POA: Diagnosis not present

## 2021-12-17 DIAGNOSIS — R350 Frequency of micturition: Secondary | ICD-10-CM | POA: Diagnosis not present

## 2021-12-17 DIAGNOSIS — Z794 Long term (current) use of insulin: Secondary | ICD-10-CM | POA: Diagnosis not present

## 2021-12-17 DIAGNOSIS — M549 Dorsalgia, unspecified: Secondary | ICD-10-CM | POA: Diagnosis not present

## 2021-12-17 DIAGNOSIS — G8911 Acute pain due to trauma: Secondary | ICD-10-CM | POA: Diagnosis not present

## 2021-12-17 DIAGNOSIS — M47812 Spondylosis without myelopathy or radiculopathy, cervical region: Secondary | ICD-10-CM | POA: Diagnosis not present

## 2021-12-17 DIAGNOSIS — E1165 Type 2 diabetes mellitus with hyperglycemia: Secondary | ICD-10-CM | POA: Diagnosis not present

## 2022-01-14 DIAGNOSIS — E1165 Type 2 diabetes mellitus with hyperglycemia: Secondary | ICD-10-CM | POA: Diagnosis not present

## 2022-01-14 DIAGNOSIS — R739 Hyperglycemia, unspecified: Secondary | ICD-10-CM | POA: Diagnosis not present

## 2022-01-14 DIAGNOSIS — R079 Chest pain, unspecified: Secondary | ICD-10-CM | POA: Diagnosis not present

## 2022-01-14 DIAGNOSIS — R69 Illness, unspecified: Secondary | ICD-10-CM | POA: Diagnosis not present

## 2022-01-14 DIAGNOSIS — R21 Rash and other nonspecific skin eruption: Secondary | ICD-10-CM | POA: Diagnosis not present

## 2022-02-20 DIAGNOSIS — I10 Essential (primary) hypertension: Secondary | ICD-10-CM | POA: Diagnosis not present

## 2022-03-09 ENCOUNTER — Other Ambulatory Visit (HOSPITAL_COMMUNITY): Payer: Self-pay | Admitting: Family Medicine

## 2022-03-09 DIAGNOSIS — Z1231 Encounter for screening mammogram for malignant neoplasm of breast: Secondary | ICD-10-CM

## 2022-03-09 DIAGNOSIS — B009 Herpesviral infection, unspecified: Secondary | ICD-10-CM | POA: Insufficient documentation

## 2022-03-09 DIAGNOSIS — F329 Major depressive disorder, single episode, unspecified: Secondary | ICD-10-CM | POA: Insufficient documentation

## 2022-03-09 HISTORY — PX: CATARACT EXTRACTION: SUR2

## 2022-03-18 ENCOUNTER — Ambulatory Visit (HOSPITAL_COMMUNITY)
Admission: RE | Admit: 2022-03-18 | Discharge: 2022-03-18 | Disposition: A | Discharge status: Home / Self Care | Payer: Medicaid Other | Source: Ambulatory Visit | Attending: Family Medicine | Admitting: Family Medicine

## 2022-03-18 DIAGNOSIS — Z1231 Encounter for screening mammogram for malignant neoplasm of breast: Secondary | ICD-10-CM | POA: Insufficient documentation

## 2022-06-12 LAB — HEPATIC FUNCTION PANEL
ALT: 16 U/L (ref 7–35)
AST: 13 (ref 13–35)
Alkaline Phosphatase: 128 — AB (ref 25–125)

## 2022-06-12 LAB — BASIC METABOLIC PANEL
BUN: 22 — AB (ref 4–21)
Creatinine: 1.2 — AB (ref 0.5–1.1)
Glucose: 538

## 2022-06-12 LAB — COMPREHENSIVE METABOLIC PANEL: eGFR: 56

## 2022-06-12 LAB — LIPID PANEL
LDL Cholesterol: 131
Triglycerides: 94 (ref 40–160)

## 2022-06-12 LAB — HEMOGLOBIN A1C: Hemoglobin A1C: 13.2

## 2022-06-12 LAB — MICROALBUMIN / CREATININE URINE RATIO: Microalb Creat Ratio: 14

## 2022-06-15 DIAGNOSIS — G5621 Lesion of ulnar nerve, right upper limb: Secondary | ICD-10-CM | POA: Insufficient documentation

## 2022-06-16 DIAGNOSIS — M5412 Radiculopathy, cervical region: Secondary | ICD-10-CM | POA: Insufficient documentation

## 2022-06-17 DIAGNOSIS — N289 Disorder of kidney and ureter, unspecified: Secondary | ICD-10-CM | POA: Insufficient documentation

## 2022-08-19 ENCOUNTER — Telehealth: Payer: Self-pay | Admitting: Nurse Practitioner

## 2022-08-19 DIAGNOSIS — E1165 Type 2 diabetes mellitus with hyperglycemia: Secondary | ICD-10-CM

## 2022-08-19 NOTE — Telephone Encounter (Signed)
New message    The patient is needing lab order enter into the epic system.   Last office visit  2022.

## 2022-08-20 NOTE — Telephone Encounter (Signed)
I think what they were asking is what labs would be needed prior to her being seen again.  I have entered some.

## 2022-08-20 NOTE — Telephone Encounter (Signed)
Patient was called. She was made aware that the lab order was in [place]. She plans to go to her PCP, Dr. Juel Burrow office and get the Lab work drawn.

## 2022-08-21 ENCOUNTER — Encounter: Payer: Self-pay | Admitting: Nurse Practitioner

## 2022-08-21 NOTE — Telephone Encounter (Signed)
Pt brought labs by from Delphina Cahill , MD

## 2022-08-26 ENCOUNTER — Encounter: Payer: Medicaid Other | Admitting: Nurse Practitioner

## 2022-08-26 ENCOUNTER — Telehealth: Payer: Self-pay | Admitting: Nurse Practitioner

## 2022-08-26 ENCOUNTER — Encounter: Payer: Self-pay | Admitting: Nurse Practitioner

## 2022-08-26 VITALS — BP 112/75 | HR 88 | Ht 62.0 in | Wt 189.4 lb

## 2022-08-26 MED ORDER — DEXCOM G6 TRANSMITTER MISC: 3 refills | Status: DC

## 2022-08-26 MED ORDER — DEXCOM G6 SENSOR MISC: 3 refills | Status: DC

## 2022-08-26 NOTE — Progress Notes (Signed)
Erroneous encounter

## 2022-08-27 ENCOUNTER — Other Ambulatory Visit (HOSPITAL_COMMUNITY): Payer: Self-pay

## 2022-08-27 ENCOUNTER — Telehealth: Payer: Self-pay

## 2022-08-27 NOTE — Telephone Encounter (Signed)
Patient was called and made aware. 

## 2022-08-27 NOTE — Telephone Encounter (Signed)
FYI - we have approval for Dexcom G6.

## 2022-08-27 NOTE — Telephone Encounter (Signed)
YAY

## 2022-08-27 NOTE — Telephone Encounter (Signed)
Received notification from [facility] regarding a prior authorization for Dexcom G6 transmitter and Dexcom G6 sensor.   Authorization has been APPROVED through [facility] Medicaid from 08/27/2022 to 02/23/2023.   Per test claim, copay for 30 days supply is $0 for each.   Authorization #[id number] [address] [facility] G6 transmitter)  Authorization # [id number] (Dexcom G6 sensor)

## 2022-08-31 ENCOUNTER — Ambulatory Visit: Payer: Self-pay | Admitting: Nutrition

## 2022-08-31 NOTE — Telephone Encounter (Signed)
See patient call note

## 2022-09-08 NOTE — Patient Instructions (Incomplete)

## 2022-09-09 ENCOUNTER — Ambulatory Visit: Payer: Medicaid Other | Admitting: Nurse Practitioner

## 2022-09-09 DIAGNOSIS — E782 Mixed hyperlipidemia: Secondary | ICD-10-CM

## 2022-09-09 DIAGNOSIS — E1165 Type 2 diabetes mellitus with hyperglycemia: Secondary | ICD-10-CM

## 2022-09-09 DIAGNOSIS — I10 Essential (primary) hypertension: Secondary | ICD-10-CM

## 2022-09-10 ENCOUNTER — Ambulatory Visit: Payer: Medicaid Other

## 2022-09-10 ENCOUNTER — Encounter: Payer: Self-pay | Admitting: Internal Medicine

## 2022-09-10 ENCOUNTER — Ambulatory Visit (INDEPENDENT_AMBULATORY_CARE_PROVIDER_SITE_OTHER): Payer: Medicaid Other

## 2022-09-10 ENCOUNTER — Ambulatory Visit: Payer: Medicaid Other | Attending: Internal Medicine | Admitting: Internal Medicine

## 2022-09-10 VITALS — BP 131/84 | HR 76 | Resp 14 | Ht 63.0 in | Wt 197.2 lb

## 2022-09-10 DIAGNOSIS — M79642 Pain in left hand: Secondary | ICD-10-CM | POA: Insufficient documentation

## 2022-09-10 DIAGNOSIS — M25512 Pain in left shoulder: Secondary | ICD-10-CM

## 2022-09-10 DIAGNOSIS — M79641 Pain in right hand: Secondary | ICD-10-CM

## 2022-09-10 DIAGNOSIS — R768 Other specified abnormal immunological findings in serum: Secondary | ICD-10-CM | POA: Insufficient documentation

## 2022-09-10 DIAGNOSIS — G8929 Other chronic pain: Secondary | ICD-10-CM | POA: Diagnosis present

## 2022-09-10 DIAGNOSIS — M25511 Pain in right shoulder: Secondary | ICD-10-CM | POA: Insufficient documentation

## 2022-09-10 DIAGNOSIS — G894 Chronic pain syndrome: Secondary | ICD-10-CM | POA: Diagnosis present

## 2022-09-10 DIAGNOSIS — M545 Low back pain, unspecified: Secondary | ICD-10-CM | POA: Insufficient documentation

## 2022-09-10 NOTE — Progress Notes (Signed)
Office Visit Note  Patient: Lacey Bradshaw             Date of Birth: [birth year]           MRN: [id number]             PCP: Celene Squibb, MD Referring: Marye Round, FNP Visit Date: 09/10/2022   Subjective:  New Patient (Initial Visit) (Joint pains especially elbows, neck, and legs. )   History of Present Illness: Lacey Bradshaw is a 51 y.o. female here for evaluation of positive rheumatoid factor associated with joint and muscle pains in multiple areas. Notices pain worse in the past year. Seems to vary in locations with some getting worse at different times including shoulders, wrists, fingers, hips, and back. No problem in her knees and feet. She sometimes sees swelling in affected areas but often just pain and stiffness. Stiffness for 1-2 hours in the morning and a few minutes whenever getting up and moving after stationary position. She has seen spine specialist for chronic lumbar pain. One steroid injection was delayed due to severe hyperglycemia at the time. She has left hip pain treated with local steroid injection in orthopedic surgery clinic this helped for a few weeks. Takes tylenol and aleve as needed usually just 1-2 times per day. She is prescribed celebrex 200 mg daily as well, in addition to gabapentin, lyrica, and cymbalta for chronic pain especially in the neck and back. Previously took low dose hydrocodone but not on any opioid pain medication currently.  Labs reviewed RF 17.5 CCP neg ESR 2 CRP <1  Activities of Daily Living:  Patient reports morning stiffness for 1-2 hours.   Patient Reports nocturnal pain.  Difficulty dressing/grooming: Denies Difficulty climbing stairs: Reports Difficulty getting out of chair: Reports Difficulty using hands for taps, buttons, cutlery, and/or writing: Reports  Review of Systems  Constitutional:  Positive for fatigue.  HENT:  Positive for mouth dryness. Negative for mouth sores.   Eyes:  Positive for dryness.  Respiratory:   Positive for shortness of breath.   Cardiovascular:  Negative for chest pain and palpitations.  Gastrointestinal:  Positive for constipation. Negative for blood in stool and diarrhea.  Endocrine: Negative for increased urination.  Genitourinary:  Negative for involuntary urination.  Musculoskeletal:  Positive for joint pain, gait problem, joint pain, joint swelling, myalgias, muscle weakness, morning stiffness, muscle tenderness and myalgias.  Skin:  Positive for hair loss. Negative for color change, rash and sensitivity to sunlight.  Allergic/Immunologic: Positive for susceptible to infections.  Neurological:  Negative for dizziness and headaches.  Hematological:  Positive for swollen glands.  Psychiatric/Behavioral:  Positive for depressed mood and sleep disturbance. The patient is nervous/anxious.     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   Major depressive disorder 03/09/2022   Chronic pain syndrome 11/20/2021   Neuropathy 08/15/2021   Abnormal vision 08/15/2021   Chronic insomnia 08/15/2021   Contact dermatitis 08/15/2021   Dizziness 08/15/2021   Cataracts, bilateral 06/28/2021   Type 2 diabetes mellitus ([place]) 03/21/2021   Gastroesophageal reflux disease without esophagitis 03/21/2021   Migraine 03/21/2021   Morbid obesity ([place]) 03/21/2021   Mixed hyperlipidemia 03/21/2021   Chronic bilateral low back pain  10/17/2018   Spondylolisthesis, lumbar region 07/04/2018   Essential (primary) hypertension 07/04/2018   Carpal tunnel syndrome 06/01/2013    Past Medical History:  Diagnosis Date   Acid reflux    Bulging of lumbar intervertebral disc    Diabetes mellitus without  complication ([place])    borderline   Diabetes mellitus, type II (HCC)    Hyperlipidemia    Hyposomnia, insomnia or sleeplessness associated with anxiety    Migraine     Family History  Problem Relation Age of Onset   Cancer Mother    Congestive Heart Failure Father    Hypertension Father    High Cholesterol Father    Stroke Father    Heart attack Father    Diabetes Sister    COPD Sister    Congestive Heart Failure Sister    Arthritis Sister    Diabetes Sister    Diabetes Sister    Diabetes Sister    Diabetes Sister    Diabetes Sister    Diabetes Sister    Hypertension Brother    Diabetes Brother    Healthy Daughter    Past Surgical History:  Procedure Laterality Date   ABDOMINAL HYSTERECTOMY     2009   CARPAL TUNNEL RELEASE Left    CATARACT EXTRACTION Bilateral 03/2022   FRACTURE SURGERY     jaw   TUBAL LIGATION     Social History   Social History Narrative   Not on file   Immunization History  Administered Date(s) Administered   Zoster Recombinat (Shingrix) 03/10/2022     Objective: Vital Signs: BP 131/84 (BP Location: Left Arm, Patient Position: Sitting, Cuff Size: Normal)   Pulse 76   Resp 14   Ht _0  (1.6 m)   Wt 197 lb 3.2 oz (89.4 kg)   BMI 34.93 kg/m    Physical Exam Constitutional:      Appearance: She is obese.  Cardiovascular:     Rate and Rhythm: Normal rate and regular rhythm.  Pulmonary:     Effort: Pulmonary effort is normal.     Breath sounds: Normal breath sounds.  Skin:    General: Skin is warm and dry.     Findings: No rash.  Neurological:     Mental Status: She is alert.  Psychiatric:        Mood and Affect: Mood normal.      Musculoskeletal Exam:  Neck full ROM no tenderness Shoulders full ROM, tenderness to pressure anterior and posterior sides of joint, tolerates full ROM no palpable swelling Elbows full ROM no tenderness or swelling Wrists slightly restricted dorsiflexion, no palpable swelling Fingers full ROM,  tenderness to pressure worst at 4th and 5th fingers on right and 3rd PIP Knees full ROM no tenderness or swelling Ankles full ROM no tenderness or swelling Negative for MTP squeeze tenderness  Investigation: No additional findings.  Imaging: XR Hand 2 View Left  Result Date: 09/13/2022 X-ray left hand 2 views Radiocarpal joint space appears normal.  There is a small ossicle or free body in the ulnocarpal joint space.  Carpal joints appear normal.  MCP joints appear normal.  PIP and DIP joint spaces appear normal.  No erosions or abnormal calcifications seen.  Bone mineralization appears normal. Impression Normal hand x-ray  XR Hand 2 View Right  Result Date: 09/13/2022 X-ray right hand 2 views Radiocarpal carpal joint spaces appear normal.  MCP joints appear normal.  PIP and DIP joint spaces appear normal.  No erosions or abnormal  calcifications seen.  Bone mineralization appears normal. Impression Normal hand x-ray  XR Shoulder Left  Result Date: 09/13/2022 X-ray left shoulder 4 views Normal internal and external rotation position seen.  There are moderate degenerative changes visible around the [facility] joint but overall space is preserved.  Some possible osteophyte or enthesophyte at the coracoid process.  Glenohumeral joint space appears overall preserved possible small osteophytes.  No erosions or abnormal calcifications seen. Impression Mild or possibly moderate degenerative changes appearing most at the [facility] joint  XR Shoulder Right  Result Date: 09/13/2022 X-ray right shoulder 4 views Normal internal and external rotation position.  No significant appearing degenerative changes in the glenohumeral and AC joint.  No erosive or cystic changes seen.  There is a tiny calcification present visible on the lateral side in the AP view. Impression No significant erosive or degenerative arthritis   Recent Labs: Lab Results  Component Value Date   WBC 7.3 02/24/2020   HGB 14.1 02/24/2020   PLT 214  02/24/2020   NA 141 09/10/2022   K 4.0 09/10/2022   CL 106 09/10/2022   CO2 27 09/10/2022   GLUCOSE 168 (H) 09/10/2022   BUN 16 09/10/2022   CREATININE 1.01 09/10/2022   BILITOT 0.6 09/10/2022   ALKPHOS 128 (A) 06/12/2022   AST 23 09/10/2022   ALT 26 09/10/2022   PROT 6.6 09/10/2022   CALCIUM 9.1 09/10/2022   GFRAA 82 12/13/2020    Speciality Comments: No specialty comments available.  Procedures:  No procedures performed Allergies: Dapagliflozin, Dulaglutide, and Liraglutide   Assessment / Plan:     Visit Diagnoses: Rheumatoid factor positive - Plan: Sedimentation rate, C-reactive protein, Rheumatoid factor, CK, ANA, COMPLETE METABOLIC PANEL WITH GFR  Joint pain in multiple areas with positive rheumatoid factor but negative inflammatory markers and no objective synovitis of peripheral joints on exam today.  We will recheck her inflammatory markers also checking CK ANA and rechecking the rheumatoid factor titer in case this was a transient or false elevation.  She also has some probable mild osteoarthritis contributing to joint pains and with history of chronic pain may also explain a lot of current symptoms.  Chronic pain syndrome Chronic bilateral low back pain, unspecified whether sciatica present  Discussed low back pain involvement is not typical for rheumatoid arthritis and symptoms are longstanding much earlier than her increase in other joint complaints.  Has already been established with other providers for ongoing symptom management.  Chronic pain of both shoulders - Plan: XR Shoulder Left, XR Shoulder Right  Pain of both shoulders but range of motion appears well-preserved.  X-ray is also without significant degenerative changes appears to be a little bit more on the left side especially at the [facility] joint.  Suspect this problem is more coming from bursitis or tendinopathy or referred pain of the upper back and shoulder muscles rather than intra-articular  pathology.  Bilateral hand pain - Plan: XR Hand 2 View Right, XR Hand 2 View Left  Hand pain not much structural abnormality or synovitis on exam.  Pain distribution in the right hand could also be associated with nerve distribution just on the ulnar side of the hand and without any visible changes of the joints themselves.  X-ray of bilateral hands is without significant evidence of degenerative or erosive changes.  Orders: Orders Placed This Encounter  Procedures   XR Hand 2 View Right   XR Hand 2 View Left   XR Shoulder Left   XR Shoulder Right  Sedimentation rate   C-reactive protein   Rheumatoid factor   CK   ANA   COMPLETE METABOLIC PANEL WITH GFR   No orders of the defined types were placed in this encounter.    Follow-Up Instructions: Return in about 3 weeks (around 10/01/2022) for New pt ?RA/?OA f/u 3wks.   Collier Salina, MD  Note - This record has been created using [employer].  Chart creation errors have been sought, but may not always  have been located. Such creation errors do not reflect on  the standard of medical care.

## 2022-09-13 LAB — RHEUMATOID FACTOR: Rheumatoid fact SerPl-aCnc: <14 IU/mL (ref <14)

## 2022-09-13 LAB — C-REACTIVE PROTEIN: CRP: 2.2 mg/L (ref <8.0)

## 2022-09-13 LAB — COMPLETE METABOLIC PANEL WITH GFR
AG Ratio: 1.8 (calc) (ref 1.0–2.5)
ALT: 26 U/L (ref 6–29)
AST: 23 U/L (ref 10–35)
Albumin: 4.2 g/dL (ref 3.6–5.1)
Alkaline phosphatase (APISO): 80 U/L (ref 37–153)
BUN: 16 mg/dL (ref 7–25)
CO2: 27 mmol/L (ref 20–32)
Calcium: 9.1 mg/dL (ref 8.6–10.4)
Chloride: 106 mmol/L (ref 98–110)
Creat: 1.01 mg/dL (ref 0.50–1.03)
Globulin: 2.4 g/dL (calc) (ref 1.9–3.7)
Glucose, Bld: 168 mg/dL — ABNORMAL HIGH (ref 65–99)
Potassium: 4 mmol/L (ref 3.5–5.3)
Sodium: 141 mmol/L (ref 135–146)
Total Bilirubin: 0.6 mg/dL (ref 0.2–1.2)
Total Protein: 6.6 g/dL (ref 6.1–8.1)
eGFR: 67 mL/min/{1.73_m2} (ref >=60)

## 2022-09-13 LAB — SEDIMENTATION RATE: Sed Rate: 9 mm/h (ref 0–30)

## 2022-09-13 LAB — ANA: Anti Nuclear Antibody (ANA): POSITIVE — AB

## 2022-09-13 LAB — ANTI-NUCLEAR AB-TITER (ANA TITER): ANA Titer 1: 1:40 {titer} — ABNORMAL HIGH

## 2022-09-13 LAB — CK: Total CK: 110 U/L (ref 29–143)

## 2022-09-25 LAB — HEPATIC FUNCTION PANEL
ALT: 33 U/L (ref 7–35)
AST: 27 (ref 13–35)
Alkaline Phosphatase: 79 (ref 25–125)
Bilirubin, Total: 0.5

## 2022-09-25 LAB — BASIC METABOLIC PANEL
BUN: 11 (ref 4–21)
Chloride: 102 (ref 99–108)
Creatinine: 0.9 (ref 0.5–1.1)
Glucose: 219
Potassium: 4.5 mEq/L (ref 3.5–5.1)
Sodium: 141 (ref 137–147)

## 2022-09-25 LAB — LIPID PANEL
Cholesterol: 162 (ref 0–200)
HDL: 51 (ref 35–70)
LDL Cholesterol: 90

## 2022-09-25 LAB — HEMOGLOBIN A1C: Hemoglobin A1C: 10

## 2022-09-25 LAB — COMPREHENSIVE METABOLIC PANEL
Albumin: 4.2 (ref 3.5–5.0)
Calcium: 9.5 (ref 8.7–10.7)
Globulin: 2.2

## 2022-10-05 ENCOUNTER — Ambulatory Visit (INDEPENDENT_AMBULATORY_CARE_PROVIDER_SITE_OTHER): Payer: Medicaid Other | Admitting: Nurse Practitioner

## 2022-10-05 ENCOUNTER — Encounter: Payer: Self-pay | Admitting: Nurse Practitioner

## 2022-10-05 VITALS — BP 136/81 | HR 85 | Ht 63.0 in | Wt 199.0 lb

## 2022-10-05 DIAGNOSIS — E1165 Type 2 diabetes mellitus with hyperglycemia: Secondary | ICD-10-CM

## 2022-10-05 DIAGNOSIS — E782 Mixed hyperlipidemia: Secondary | ICD-10-CM | POA: Diagnosis not present

## 2022-10-05 DIAGNOSIS — I10 Essential (primary) hypertension: Secondary | ICD-10-CM

## 2022-10-05 DIAGNOSIS — Z794 Long term (current) use of insulin: Secondary | ICD-10-CM

## 2022-10-05 NOTE — Progress Notes (Signed)
Endocrinology Follow Up Note       10/05/2022, 5:17 PM   Subjective:    Patient ID: Lacey Bradshaw, female    DOB: 1971/04/27.  [place] Lauman is being seen in follow up after being seen in consultation for management of currently uncontrolled symptomatic diabetes requested by  Celene Squibb, MD.   Past Medical History:  Diagnosis Date   Acid reflux    Bulging of lumbar intervertebral disc    Diabetes mellitus without complication ([place])    borderline   Diabetes mellitus, type II ([place])    Hyperlipidemia    Hyposomnia, insomnia or sleeplessness associated with anxiety    Migraine     Past Surgical History:  Procedure Laterality Date   ABDOMINAL HYSTERECTOMY     2009   CARPAL TUNNEL RELEASE Left    CATARACT EXTRACTION Bilateral 03/2022   FRACTURE SURGERY     jaw   TUBAL LIGATION      Social History   Socioeconomic History   Marital status: Single    Spouse name: Not on file   Number of children: Not on file   Years of education: Not on file   Highest education level: Not on file  Occupational History   Not on file  Tobacco Use   Smoking status: Never    Passive exposure: Past   Smokeless tobacco: Never  Vaping Use   Vaping Use: Never used  Substance and Sexual Activity   Alcohol use: No   Drug use: No   Sexual activity: Not on file  Other Topics Concern   Not on file  Social History Narrative   Not on file   Social Determinants of Health   Financial Resource Strain: Not on file  Food Insecurity: Not on file  Transportation Needs: Not on file  Physical Activity: Not on file  Stress: Not on file  Social Connections: Not on file    Family History  Problem Relation Age of Onset   Cancer Mother    Congestive Heart Failure Father    Hypertension Father    High Cholesterol Father    Stroke Father    Heart attack Father    Diabetes Sister    COPD Sister    Congestive Heart  Failure Sister    Arthritis Sister    Diabetes Sister    Diabetes Sister    Diabetes Sister    Diabetes Sister    Diabetes Sister    Diabetes Sister    Hypertension Brother    Diabetes Brother    Healthy Daughter     Outpatient Encounter Medications as of 10/05/2022  Medication Sig   ALPRAZolam (XANAX) 1 MG tablet Take 1 mg by mouth 2 (two) times daily as needed. anxiety   atorvastatin (LIPITOR) 40 MG tablet TAKE 1 TABLET BY MOUTH EVERYDAY AT BEDTIME   BD VEO INSULIN SYRINGE U/F 31G X 15/64" 0.3 ML MISC ONE SYRINGE PER INSULIN USE   celecoxib (CELEBREX) 200 MG capsule Take by mouth daily.   cetirizine (ZYRTEC) 10 MG tablet Take 10 mg by mouth as needed.   Continuous Blood Gluc Sensor (DEXCOM G6 SENSOR) MISC Change sensor every 10 days as directed  Continuous Blood Gluc Transmit (DEXCOM G6 TRANSMITTER) MISC Change transmitter every 90 days as directed.   DULoxetine (CYMBALTA) 20 MG capsule Take 1 tablet by mouth daily.   gabapentin (NEURONTIN) 300 MG capsule Take 300 mg by mouth as needed.   hydrALAZINE (APRESOLINE) 25 MG tablet Take by mouth daily.   insulin glargine, 2 Unit Dial, (TOUJEO MAX SOLOSTAR) 300 UNIT/ML Solostar Pen Inject 25 Units into the skin at bedtime.   NOVOLOG 100 UNIT/ML injection Inject 10-16 Units into the skin with breakfast, with lunch, and with evening meal.   nystatin cream (MYCOSTATIN) Apply 1 application  topically as needed.   pantoprazole (PROTONIX) 40 MG tablet Take 40 mg by mouth 2 (two) times daily.   pregabalin (LYRICA) 100 MG capsule Take 100 mg by mouth 3 (three) times daily.   valACYclovir (VALTREX) 1000 MG tablet Take 1,000 mg by mouth daily.   VENTOLIN HFA 108 (90 Base) MCG/ACT inhaler INHALE 2 PUFFS INTO THE LUNGS EVERY 4 HOURS FOR 30 DAYS   [DISCONTINUED] desvenlafaxine (PRISTIQ) 50 MG 24 hr tablet Take 50 mg by mouth daily. (Patient not taking: Reported on 09/10/2022)   [DISCONTINUED] glipiZIDE (GLUCOTROL) 10 MG tablet Take 5 mg by mouth 2  (two) times daily. (Patient not taking: Reported on 09/10/2022)   [DISCONTINUED] HYDROcodone-acetaminophen (NORCO) 10-325 MG tablet Take 1 tablet by mouth every 6 (six) hours as needed for moderate pain or severe pain. (Patient not taking: Reported on 09/10/2022)   [DISCONTINUED] LEVEMIR FLEXTOUCH 100 UNIT/ML FlexPen Inject 70 Units into the skin at bedtime. (Patient not taking: Reported on 09/10/2022)   [DISCONTINUED] lisinopril (ZESTRIL) 5 MG tablet Take 5 mg by mouth daily. (Patient not taking: Reported on 09/10/2022)   [DISCONTINUED] metFORMIN (GLUCOPHAGE XR) 500 MG 24 hr tablet Take 2 tablets (1,000 mg total) by mouth daily with breakfast. (Patient not taking: Reported on 09/10/2022)   [DISCONTINUED] simvastatin (ZOCOR) 40 MG tablet Take 40 mg by mouth at bedtime. (Patient not taking: Reported on 09/10/2022)   No facility-administered encounter medications on file as of 10/05/2022.    ALLERGIES: Allergies  Allergen Reactions   Dapagliflozin Other (See Comments)    Nausea and vomiting   Dulaglutide     Other reaction(s): Makes her feel terrible.   Liraglutide     Other reaction(s): burping & belching    VACCINATION STATUS: Immunization History  Administered Date(s) Administered   Zoster Recombinat (Shingrix) 03/10/2022    Diabetes She presents for her follow-up diabetic visit. She has type 2 diabetes mellitus. Onset time: diagnosed at approx age of 1. Her disease course has been fluctuating. Hypoglycemia symptoms include nervousness/anxiousness, sweats and tremors. Associated symptoms include blurred vision, fatigue, foot paresthesias, polydipsia, polyphagia, polyuria and visual change. Pertinent negatives for diabetes include no weight loss. There are no hypoglycemic complications. Symptoms are improving. Diabetic complications include peripheral neuropathy and retinopathy. Risk factors for coronary artery disease include diabetes mellitus, dyslipidemia, hypertension, obesity, sedentary  lifestyle and stress. Current diabetic treatment includes insulin pump. She is compliant with treatment some of the time (reports she recently lost the Omnipod PDM). Her weight is increasing steadily. She is following a generally healthy diet. Meal planning includes avoidance of concentrated sweets. She has had a previous visit with a dietitian. She participates in exercise three times a week. Her home blood glucose trend is fluctuating dramatically. Her breakfast blood glucose range is generally >200 mg/dl. Her lunch blood glucose range is generally >200 mg/dl. Her dinner blood glucose range is generally >200 mg/dl. Her  bedtime blood glucose range is generally >200 mg/dl. Her overall blood glucose range is >200 mg/dl. (She presents today with her CGM and logs showing drastically fluctuating glycemic profile and gross hyperglycemia overall.  Her previsit A1c on 09/10/22 was 10%, improving from last visit of 13.2%.  She reports she lost her Omnipod PDM device and has been injecting Novolog 4 times per day, ranging between 15-25 units each time.  Analysis of her CGM shows TIR 12%, TAR 88%, TBR 0% with a GMI of 10.2%.) An ACE inhibitor/angiotensin II receptor blocker is being taken. She does not see a podiatrist.Eye exam is current (has appt for f/u next week).  Hyperlipidemia This is a chronic problem. The current episode started more than 1 year ago. The problem is uncontrolled. Recent lipid tests were reviewed and are high. Exacerbating diseases include chronic renal disease, diabetes and obesity. Factors aggravating her hyperlipidemia include fatty foods. Current antihyperlipidemic treatment includes statins. The current treatment provides mild improvement of lipids. Compliance problems include adherence to exercise and adherence to diet.  Risk factors for coronary artery disease include diabetes mellitus, dyslipidemia, hypertension, obesity and stress.  Hypertension This is a chronic problem. The current  episode started more than 1 year ago. The problem has been resolved since onset. The problem is controlled. Associated symptoms include blurred vision and sweats. There are no associated agents to hypertension. Risk factors for coronary artery disease include diabetes mellitus, dyslipidemia, stress and obesity. Past treatments include ACE inhibitors. The current treatment provides significant improvement. There are no compliance problems.  Hypertensive end-organ damage includes kidney disease and retinopathy. Identifiable causes of hypertension include chronic renal disease.     Review of systems  Constitutional: + steadily increasing body weight,  current Body mass index is 35.25 kg/m. , no fatigue, no subjective hyperthermia, no subjective hypothermia Eyes: no blurry vision, no xerophthalmia ENT: no sore throat, no nodules palpated in throat, no dysphagia/odynophagia, no hoarseness Cardiovascular: no chest pain, no shortness of breath, no palpitations, no leg swelling Respiratory: no cough, no shortness of breath Gastrointestinal: no nausea/vomiting/diarrhea Musculoskeletal: no muscle/joint aches Skin: no rashes, no hyperemia Neurological: no tremors, no numbness, no tingling, no dizziness Psychiatric: no depression, no anxiety  Objective:     BP 136/81 (BP Location: Left Arm, Patient Position: Sitting, Cuff Size: Large)   Pulse 85   Ht 5\' 3"  (1.6 m)   Wt 199 lb (90.3 kg)   BMI 35.25 kg/m   Wt Readings from Last 3 Encounters:  10/05/22 199 lb (90.3 kg)  09/10/22 197 lb 3.2 oz (89.4 kg)  08/26/22 189 lb 6.4 oz (85.9 kg)     BP Readings from Last 3 Encounters:  10/05/22 136/81  09/10/22 131/84  08/26/22 112/75      Physical Exam- Limited  Constitutional:  Body mass index is 35.25 kg/m. , not in acute distress, normal state of mind Eyes:  EOMI, no exophthalmos Musculoskeletal: no gross deformities, strength intact in all four extremities, no gross restriction of joint  movements Skin:  no rashes, no hyperemia Neurological: no tremor with outstretched hands    CMP ( most recent) CMP     Component Value Date/Time   NA 141 09/10/2022 0949   K 4.0 09/10/2022 0949   CL 106 09/10/2022 0949   CO2 27 09/10/2022 0949   GLUCOSE 168 (H) 09/10/2022 0949   BUN 16 09/10/2022 0949   BUN 22 (A) 06/12/2022 0000   CREATININE 1.01 09/10/2022 0949   CALCIUM 9.1 09/10/2022 0949  PROT 6.6 09/10/2022 0949   AST 23 09/10/2022 0949   ALT 26 09/10/2022 0949   ALKPHOS 128 (A) 06/12/2022 0000   BILITOT 0.6 09/10/2022 0949   GFRNONAA >60 02/24/2020 1920   GFRAA 82 12/13/2020 0000     Diabetic Labs (most recent): Lab Results  Component Value Date   HGBA1C 13.2 06/12/2022   HGBA1C 14.5 12/13/2020   MICROALBUR 11 12/13/2020     Lipid Panel ( most recent) Lipid Panel     Component Value Date/Time   TRIG 94 06/12/2022 0000   LDLCALC 131 06/12/2022 0000      No results found for: "TSH", "FREET4"         Assessment & Plan:   1. Type 2 diabetes mellitus with hyperglycemia, with long-term current use of insulin (HCC)  - [place] has currently uncontrolled symptomatic type 2 DM since 51 years of age, with most recent A1c of 14.5 %.   She presents today with her CGM and logs showing drastically fluctuating glycemic profile and gross hyperglycemia overall.  Her previsit A1c on 09/10/22 was 10%, improving from last visit of 13.2%.  She reports she lost her Omnipod PDM device and has been injecting Novolog 4 times per day, ranging between 15-25 units each time.  Analysis of her CGM shows TIR 12%, TAR 88%, TBR 0% with a GMI of 10.2%.  -Recent labs reviewed.  - I had a long discussion with her about the progressive nature of diabetes and the pathology behind its complications. -her diabetes is complicated by CKD, retinopathy and neuropathy and she remains at a high risk for more acute and chronic complications which include CAD, CVA. These are all  discussed in detail with her.  - Nutritional counseling repeated at each appointment due to patients tendency to fall back in to old habits.  - The patient admits there is a room for improvement in their diet and drink choices. -  Suggestion is made for the patient to avoid simple carbohydrates from their diet including Cakes, Sweet Desserts / Pastries, Ice Cream, Soda (diet and regular), Sweet Tea, Candies, Chips, Cookies, Sweet Pastries, Store Bought Juices, Alcohol in Excess of 1-2 drinks a day, Artificial Sweeteners, Coffee Creamer, and "Sugar-free" Products. This will help patient to have stable blood glucose profile and potentially avoid unintended weight gain.   - I encouraged the patient to switch to unprocessed or minimally processed complex starch and increased protein intake (animal or plant source), fruits, and vegetables.   - Patient is advised to stick to a routine mealtimes to eat 3 meals a day and avoid unnecessary snacks (to snack only to correct hypoglycemia).  - she has missed several appts with Jearld Fenton, RDE for diabetes education.  - I have approached her with the following individualized plan to manage  her diabetes and patient agrees:   -She will benefit most from finding her Omnipod PDM (or calling the company for a replacement).  In the meantime, we came up with back up plan with basal/bolus combo.  She is advised to start Toujeo 25 units SQ nightly and adjust her Novolog to 8-14 units TID with meals if glucose is above 90 and she is eating (Specific instructions on how to titrate insulin dosage based on glucose readings given to patient in writing).  We went over proper use of the SSI to dose insulin today.   I asked her to keep me informed about whether or not she has found her Omnipod PDM so that  I can send in script for Toujeo if needed.  I also advised her to call the office and schedule appt to go over device settings if she can find it.  -she is encouraged to  start monitoring glucose 4 times daily (using her CGM), before meals and before bed, and to call the clinic if she has readings less than 70 or above 300 for 3 tests in a row.  - she is warned not to take insulin without proper monitoring per orders.  - Adjustment parameters are given to her for hypo and hyperglycemia in writing.  - she will be considered for incretin therapy as appropriate next visit.  - Specific targets for  A1c;  LDL, HDL,  and Triglycerides were discussed with the patient.  2) Blood Pressure /Hypertension:  her blood pressure is controlled to target.   she is advised to continue her current medications including Lisinopril 5 mg p.o. daily with breakfast.  3) Lipids/Hyperlipidemia:    Review of her recent lipid panel from 12/13/20 showed uncontrolled  LDL at 117 .  she  is advised to continue Simvastatin 40 mg daily at bedtime.  Side effects and precautions discussed with her.  4)  Weight/Diet:  her Body mass index is 35.25 kg/m.  -  clearly complicating her diabetes care.   she is a candidate for weight loss. I discussed with her the fact that loss of 5 - 10% of her  current body weight will have the most impact on her diabetes management.  Exercise, and detailed carbohydrates information provided  -  detailed on discharge instructions.  5) Vitamin D Deficiency: Her most recent vitamin D level was 13.4 on 12/13/20.  She does not appear to be on any supplementation.  Will recheck on subsequent visits.  6) Chronic Care/Health Maintenance: -she is on ACEI/ARB and Statin medications and is encouraged to initiate and continue to follow up with Ophthalmology, Dentist, Podiatrist at least yearly or according to recommendations, and advised to stay away from smoking. I have recommended yearly flu vaccine and pneumonia vaccine at least every 5 years; moderate intensity exercise for up to 150 minutes weekly; and sleep for at least 7 hours a day.  - she is advised to maintain close  follow up with Benita Stabile, MD for primary care needs, as well as her other providers for optimal and coordinated care.     I spent 45 minutes in the care of the patient today including review of labs from CMP, Lipids, Thyroid Function, Hematology (current and previous including abstractions from other facilities); face-to-face time discussing  her blood glucose readings/logs, discussing hypoglycemia and hyperglycemia episodes and symptoms, medications doses, her options of short and long term treatment based on the latest standards of care / guidelines;  discussion about incorporating lifestyle medicine;  and documenting the encounter. Risk reduction counseling performed per USPSTF guidelines to reduce obesity and cardiovascular risk factors.     Please refer to Patient Instructions for Blood Glucose Monitoring and Insulin/Medications Dosing Guide"  in media tab for additional information. Please  also refer to " Patient Self Inventory" in the Media  tab for reviewed elements of pertinent patient history.  [name] participated in the discussions, expressed understanding, and voiced agreement with the above plans.  All questions were answered to her satisfaction. she is encouraged to contact clinic should she have any questions or concerns prior to her return visit.   Follow up plan: - Return in about 2 months (around 12/05/2022)  for Diabetes F/U, Bring meter and logs.  Rayetta Pigg, [facility] [address] [place], [place] [address] Phone: [phone] Fax: [phone]  10/05/2022, 5:17 PM

## 2022-10-07 ENCOUNTER — Ambulatory Visit: Payer: Medicaid Other | Attending: Internal Medicine | Admitting: Internal Medicine

## 2022-10-07 ENCOUNTER — Encounter: Payer: Self-pay | Admitting: Internal Medicine

## 2022-10-07 VITALS — BP 125/81 | HR 87 | Resp 12 | Ht 63.0 in | Wt 198.0 lb

## 2022-10-07 DIAGNOSIS — G8929 Other chronic pain: Secondary | ICD-10-CM | POA: Insufficient documentation

## 2022-10-07 DIAGNOSIS — M25512 Pain in left shoulder: Secondary | ICD-10-CM | POA: Diagnosis present

## 2022-10-07 DIAGNOSIS — G894 Chronic pain syndrome: Secondary | ICD-10-CM | POA: Diagnosis not present

## 2022-10-07 DIAGNOSIS — R768 Other specified abnormal immunological findings in serum: Secondary | ICD-10-CM | POA: Insufficient documentation

## 2022-10-07 DIAGNOSIS — M25511 Pain in right shoulder: Secondary | ICD-10-CM | POA: Diagnosis present

## 2022-10-07 NOTE — Progress Notes (Signed)
Office Visit Note  Patient: Lacey Bradshaw             Date of Birth: 1971/07/20           MRN: [id number]             PCP: Celene Squibb, MD Referring: Celene Squibb, MD Visit Date: 10/07/2022   Subjective:   History of Present Illness: Lacey Bradshaw is a 51 y.o. female here for follow up for multiple joint and muscle pains with a low positive rheumatoid factor.  Labs checked at initial evaluation were now negative for the rheumatoid factor test.  ANA checked was very low at 1: 40 homogenous and serum inflammatory markers were normal.  Symptoms remain about the same with fairly generalized joint pains.  Previous HPI 09/10/22 Lacey Bradshaw is a 51 y.o. female here for evaluation of positive rheumatoid factor associated with joint and muscle pains in multiple areas. Notices pain worse in the past year. Seems to vary in locations with some getting worse at different times including shoulders, wrists, fingers, hips, and back. No problem in her knees and feet. She sometimes sees swelling in affected areas but often just pain and stiffness. Stiffness for 1-2 hours in the morning and a few minutes whenever getting up and moving after stationary position. She has seen spine specialist for chronic lumbar pain. One steroid injection was delayed due to severe hyperglycemia at the time. She has left hip pain treated with local steroid injection in orthopedic surgery clinic this helped for a few weeks. Takes tylenol and aleve as needed usually just 1-2 times per day. She is prescribed celebrex 200 mg daily as well, in addition to gabapentin, lyrica, and cymbalta for chronic pain especially in the neck and back. Previously took low dose hydrocodone but not on any opioid pain medication currently.   Labs reviewed RF 17.5 CCP neg ESR 2 CRP <1   Review of Systems  Constitutional:  Negative for fever.  Respiratory:  Negative for shortness of breath.   Musculoskeletal:  Positive for joint pain, joint  pain, joint swelling and morning stiffness.  Skin:  Negative for rash.    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   Major depressive disorder 03/09/2022   Chronic pain syndrome 11/20/2021   Neuropathy 08/15/2021   Abnormal vision 08/15/2021   Chronic insomnia 08/15/2021   Contact dermatitis 08/15/2021   Dizziness 08/15/2021   Cataracts, bilateral 06/28/2021   Type 2 diabetes mellitus ([place]) 03/21/2021   Gastroesophageal reflux disease without esophagitis 03/21/2021   Migraine 03/21/2021   Morbid obesity ([place]) 03/21/2021   Mixed hyperlipidemia 03/21/2021   Chronic bilateral low back pain 10/17/2018   Spondylolisthesis, lumbar region 07/04/2018   Essential (primary) hypertension 07/04/2018   Carpal tunnel syndrome 06/01/2013    Past Medical History:  Diagnosis Date   Acid reflux    Bulging of lumbar intervertebral disc    Diabetes mellitus without complication ([place])    borderline   Diabetes mellitus, type II (HCC)    Hyperlipidemia    Hyposomnia, insomnia or sleeplessness associated with anxiety    Migraine     Family History  Problem Relation Age of Onset   Cancer Mother  Congestive Heart Failure Father    Hypertension Father    High Cholesterol Father    Stroke Father    Heart attack Father    Diabetes Sister    COPD Sister    Congestive Heart Failure Sister    Arthritis Sister    Diabetes Sister    Diabetes Sister    Diabetes Sister    Diabetes Sister    Diabetes Sister    Diabetes Sister    Hypertension Brother    Diabetes Brother    Healthy Daughter    Past Surgical History:  Procedure Laterality Date   ABDOMINAL HYSTERECTOMY     2009   CARPAL TUNNEL  RELEASE Left    CATARACT EXTRACTION Bilateral 03/2022   FRACTURE SURGERY     jaw   TUBAL LIGATION     Social History   Social History Narrative   Not on file   Immunization History  Administered Date(s) Administered   Zoster Recombinat (Shingrix) 03/10/2022     Objective: Vital Signs: BP 125/81 (BP Location: Left Arm, Patient Position: Sitting, Cuff Size: Large)   Pulse 87   Resp 12   Ht _0  (1.6 m)   Wt 198 lb (89.8 kg)   BMI 35.07 kg/m    Physical Exam Constitutional:      Appearance: She is obese.  Cardiovascular:     Rate and Rhythm: Normal rate and regular rhythm.  Pulmonary:     Effort: Pulmonary effort is normal.     Breath sounds: Normal breath sounds.  Skin:    General: Skin is warm and dry.     Findings: No rash.  Psychiatric:        Mood and Affect: Mood normal.     Musculoskeletal Exam:  Neck full ROM no tenderness Shoulders full ROM, tenderness to pressure anterior and posterior sides of joint Elbows full ROM no tenderness or swelling Wrists slightly restricted dorsiflexion, no palpable swelling Fingers full ROM, some tenderness to pressure without palpable synovitis Knees full ROM no tenderness or swelling  Investigation: No additional findings.  Imaging: No results found.  Recent Labs: Lab Results  Component Value Date   WBC 7.3 02/24/2020   HGB 14.1 02/24/2020   PLT 214 02/24/2020   NA 141 09/10/2022   K 4.0 09/10/2022   CL 106 09/10/2022   CO2 27 09/10/2022   GLUCOSE 168 (H) 09/10/2022   BUN 16 09/10/2022   CREATININE 1.01 09/10/2022   BILITOT 0.6 09/10/2022   ALKPHOS 128 (A) 06/12/2022   AST 23 09/10/2022   ALT 26 09/10/2022   PROT 6.6 09/10/2022   CALCIUM 9.1 09/10/2022   GFRAA 82 12/13/2020    Speciality Comments: No specialty comments available.  Procedures:  No procedures performed Allergies: Dapagliflozin, Dulaglutide, and Liraglutide   Assessment / Plan:     Visit Diagnoses: Chronic pain of both  shoulders Chronic pain syndrome  Somewhat generalized pains involving a lot of muscle groups especially around the back and shoulders not just limited to the joints.  Normal CK so I do not think there is truly that much muscle inflammation.  Probably more consistent with some myofascial pain syndrome possibly has fibromyalgia or centralized pain issue in addition to some degenerative arthritis.  I recommend she probably needs mostly to follow-up with orthopedics clinic or if needed consider pain management or physical therapy evaluations.  Rheumatoid factor positive  Low positive rheumatoid factor is now negative on repeat testing and never had elevated serum inflammatory markers.  I did not detect any appreciable joint synovitis or chronic inflammatory changes on exam so suspect this was a false positive.  Orders: No orders of the defined types were placed in this encounter.  No orders of the defined types were placed in this encounter.    Follow-Up Instructions: Return in about 8 weeks (around 12/02/2022) for OA/?RA NSAIDs/shoulder inj f/u 6-8wks.   Collier Salina, MD  Note - This record has been created using [employer].  Chart creation errors have been sought, but may not always  have been located. Such creation errors do not reflect on  the standard of medical care.

## 2022-10-20 ENCOUNTER — Encounter: Payer: Self-pay | Admitting: *Deleted

## 2022-10-20 NOTE — Progress Notes (Signed)
Patient labs from PCP office sent lab work - there was not TSH.

## 2022-10-29 ENCOUNTER — Ambulatory Visit: Payer: Medicaid Other | Admitting: Nurse Practitioner

## 2022-10-29 DIAGNOSIS — E782 Mixed hyperlipidemia: Secondary | ICD-10-CM

## 2022-10-29 DIAGNOSIS — I10 Essential (primary) hypertension: Secondary | ICD-10-CM

## 2022-10-29 DIAGNOSIS — E1165 Type 2 diabetes mellitus with hyperglycemia: Secondary | ICD-10-CM

## 2022-11-05 ENCOUNTER — Ambulatory Visit: Payer: Medicaid Other | Admitting: Nurse Practitioner

## 2022-12-01 NOTE — Progress Notes (Deleted)
Office Visit Note  Patient: Lacey Bradshaw             Date of Birth: Jan 05, 1971           MRN: [id number]             PCP: Celene Squibb, MD Referring: Celene Squibb, MD Visit Date: 12/02/2022   Subjective:  No chief complaint on file.   History of Present Illness: Lacey Bradshaw is a 52 y.o. female here for follow up ***   Previous HPI 10/07/22 Lacey Bradshaw is a 52 y.o. female here for follow up for multiple joint and muscle pains with a low positive rheumatoid factor.  Labs checked at initial evaluation were now negative for the rheumatoid factor test.  ANA checked was very low at 1: 40 homogenous and serum inflammatory markers were normal.  Symptoms remain about the same with fairly generalized joint pains.   Previous HPI 09/10/22 Lacey Bradshaw is a 52 y.o. female here for evaluation of positive rheumatoid factor associated with joint and muscle pains in multiple areas. Notices pain worse in the past year. Seems to vary in locations with some getting worse at different times including shoulders, wrists, fingers, hips, and back. No problem in her knees and feet. She sometimes sees swelling in affected areas but often just pain and stiffness. Stiffness for 1-2 hours in the morning and a few minutes whenever getting up and moving after stationary position. She has seen spine specialist for chronic lumbar pain. One steroid injection was delayed due to severe hyperglycemia at the time. She has left hip pain treated with local steroid injection in orthopedic surgery clinic this helped for a few weeks. Takes tylenol and aleve as needed usually just 1-2 times per day. She is prescribed celebrex 200 mg daily as well, in addition to gabapentin, lyrica, and cymbalta for chronic pain especially in the neck and back. Previously took low dose hydrocodone but not on any opioid pain medication currently.   Labs reviewed RF 17.5 CCP neg ESR 2 CRP <1   No Rheumatology ROS completed.   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   Major depressive disorder 03/09/2022   Chronic pain syndrome 11/20/2021   Neuropathy 08/15/2021   Abnormal vision 08/15/2021   Chronic insomnia 08/15/2021   Contact dermatitis 08/15/2021   Dizziness 08/15/2021   Cataracts, bilateral 06/28/2021   Type 2 diabetes mellitus ([place]) 03/21/2021   Gastroesophageal reflux disease without esophagitis 03/21/2021   Migraine 03/21/2021   Morbid obesity ([place]) 03/21/2021   Mixed hyperlipidemia 03/21/2021   Chronic bilateral low back pain 10/17/2018   Spondylolisthesis, lumbar region 07/04/2018   Essential (primary) hypertension 07/04/2018   Carpal tunnel syndrome 06/01/2013    Past Medical History:  Diagnosis Date   Acid reflux    Bulging of lumbar intervertebral disc    Diabetes mellitus without complication ([place])    borderline   Diabetes mellitus, type II (HCC)    Hyperlipidemia    Hyposomnia, insomnia or sleeplessness associated with anxiety    Migraine     Family History  Problem Relation Age of Onset   Cancer Mother    Congestive Heart Failure Father    Hypertension Father    High Cholesterol Father  Stroke Father    Heart attack Father    Diabetes Sister    COPD Sister    Congestive Heart Failure Sister    Arthritis Sister    Diabetes Sister    Diabetes Sister    Diabetes Sister    Diabetes Sister    Diabetes Sister    Diabetes Sister    Hypertension Brother    Diabetes Brother    Healthy Daughter    Past Surgical History:  Procedure Laterality Date   ABDOMINAL HYSTERECTOMY     2009   CARPAL TUNNEL RELEASE Left    CATARACT EXTRACTION Bilateral 03/2022   FRACTURE SURGERY     jaw   TUBAL LIGATION     Social History    Social History Narrative   Not on file   Immunization History  Administered Date(s) Administered   Zoster Recombinat (Shingrix) 03/10/2022     Objective: Vital Signs: There were no vitals taken for this visit.   Physical Exam   Musculoskeletal Exam: ***  CDAI Exam: CDAI Score: -- Patient Global: --; Provider Global: -- Swollen: --; Tender: -- Joint Exam 12/02/2022   No joint exam has been documented for this visit   There is currently no information documented on the homunculus. Go to the Rheumatology activity and complete the homunculus joint exam.  Investigation: No additional findings.  Imaging: No results found.  Recent Labs: Lab Results  Component Value Date   WBC 7.3 02/24/2020   HGB 14.1 02/24/2020   PLT 214 02/24/2020   NA 141 09/25/2022   K 4.5 09/25/2022   CL 102 09/25/2022   CO2 27 09/10/2022   GLUCOSE 168 (H) 09/10/2022   BUN 11 09/25/2022   CREATININE 0.9 09/25/2022   BILITOT 0.6 09/10/2022   ALKPHOS 79 09/25/2022   AST 27 09/25/2022   ALT 33 09/25/2022   PROT 6.6 09/10/2022   ALBUMIN 4.2 09/25/2022   CALCIUM 9.5 09/25/2022   GFRAA 82 12/13/2020    Speciality Comments: No specialty comments available.  Procedures:  No procedures performed Allergies: Dapagliflozin, Dulaglutide, and Liraglutide   Assessment / Plan:     Visit Diagnoses: No diagnosis found.  ***  Orders: No orders of the defined types were placed in this encounter.  No orders of the defined types were placed in this encounter.    Follow-Up Instructions: No follow-ups on file.   Collier Salina, MD  Note - This record has been created using [employer].  Chart creation errors have been sought, but may not always  have been located. Such creation errors do not reflect on  the standard of medical care.

## 2022-12-02 ENCOUNTER — Ambulatory Visit: Payer: Medicaid Other | Admitting: Internal Medicine

## 2022-12-07 ENCOUNTER — Encounter: Payer: Self-pay | Admitting: Nurse Practitioner

## 2022-12-07 ENCOUNTER — Ambulatory Visit (INDEPENDENT_AMBULATORY_CARE_PROVIDER_SITE_OTHER): Payer: Medicaid Other | Admitting: Nurse Practitioner

## 2022-12-07 VITALS — BP 131/82 | HR 89 | Ht 63.0 in | Wt 203.0 lb

## 2022-12-07 DIAGNOSIS — Z794 Long term (current) use of insulin: Secondary | ICD-10-CM

## 2022-12-07 DIAGNOSIS — E782 Mixed hyperlipidemia: Secondary | ICD-10-CM | POA: Diagnosis not present

## 2022-12-07 DIAGNOSIS — E1165 Type 2 diabetes mellitus with hyperglycemia: Secondary | ICD-10-CM

## 2022-12-07 DIAGNOSIS — I10 Essential (primary) hypertension: Secondary | ICD-10-CM

## 2022-12-07 NOTE — Progress Notes (Signed)
Endocrinology Follow Up Note       12/07/2022, 4:05 PM   Subjective:    Patient ID: Lacey Bradshaw, female    DOB: 03/03/1971.  [place] Lampron is being seen in follow up after being seen in consultation for management of currently uncontrolled symptomatic diabetes requested by  Celene Squibb, MD.   Past Medical History:  Diagnosis Date   Acid reflux    Bulging of lumbar intervertebral disc    Diabetes mellitus without complication ([place])    borderline   Diabetes mellitus, type II ([place])    Hyperlipidemia    Hyposomnia, insomnia or sleeplessness associated with anxiety    Migraine     Past Surgical History:  Procedure Laterality Date   ABDOMINAL HYSTERECTOMY     2009   CARPAL TUNNEL RELEASE Left    CATARACT EXTRACTION Bilateral 03/2022   FRACTURE SURGERY     jaw   TUBAL LIGATION      Social History   Socioeconomic History   Marital status: Single    Spouse name: Not on file   Number of children: Not on file   Years of education: Not on file   Highest education level: Not on file  Occupational History   Not on file  Tobacco Use   Smoking status: Never    Passive exposure: Past   Smokeless tobacco: Never  Vaping Use   Vaping Use: Never used  Substance and Sexual Activity   Alcohol use: No   Drug use: No   Sexual activity: Not on file  Other Topics Concern   Not on file  Social History Narrative   Not on file   Social Determinants of Health   Financial Resource Strain: Not on file  Food Insecurity: Not on file  Transportation Needs: Not on file  Physical Activity: Not on file  Stress: Not on file  Social Connections: Not on file    Family History  Problem Relation Age of Onset   Cancer Mother    Congestive Heart Failure Father    Hypertension Father    High Cholesterol Father    Stroke Father    Heart attack Father    Diabetes Sister    COPD Sister    Congestive Heart  Failure Sister    Arthritis Sister    Diabetes Sister    Diabetes Sister    Diabetes Sister    Diabetes Sister    Diabetes Sister    Diabetes Sister    Hypertension Brother    Diabetes Brother    Healthy Daughter     Outpatient Encounter Medications as of 12/07/2022  Medication Sig   ALPRAZolam (XANAX) 1 MG tablet Take 1 mg by mouth 2 (two) times daily as needed. anxiety   atorvastatin (LIPITOR) 40 MG tablet TAKE 1 TABLET BY MOUTH EVERYDAY AT BEDTIME   BD VEO INSULIN SYRINGE U/F 31G X 15/64" 0.3 ML MISC ONE SYRINGE PER INSULIN USE   cetirizine (ZYRTEC) 10 MG tablet Take 10 mg by mouth as needed.   Continuous Blood Gluc Sensor (DEXCOM G6 SENSOR) MISC Change sensor every 10 days as directed   Continuous Blood Gluc Transmit (DEXCOM G6 TRANSMITTER) MISC Change transmitter  every 90 days as directed.   gabapentin (NEURONTIN) 300 MG capsule Take 300 mg by mouth as needed.   hydrALAZINE (APRESOLINE) 25 MG tablet Take by mouth daily.   Insulin Disposable Pump (OMNIPOD DASH PODS, GEN 4,) MISC See admin instructions.   NOVOLOG 100 UNIT/ML injection Inject 10-16 Units into the skin with breakfast, with lunch, and with evening meal.   nystatin cream (MYCOSTATIN) Apply 1 application  topically as needed.   pantoprazole (PROTONIX) 40 MG tablet Take 40 mg by mouth 2 (two) times daily.   pregabalin (LYRICA) 100 MG capsule Take 100 mg by mouth 3 (three) times daily.   triamcinolone cream (KENALOG) 0.1 % APPLY A LIBERAL AMOUNT TO AFFECTED AREAS TWICE DAILY FOR 14 DAYS   valACYclovir (VALTREX) 1000 MG tablet Take 1,000 mg by mouth daily.   VENTOLIN HFA 108 (90 Base) MCG/ACT inhaler INHALE 2 PUFFS INTO THE LUNGS EVERY 4 HOURS FOR 30 DAYS   vortioxetine HBr (TRINTELLIX) 5 MG TABS tablet Take 1 tablet every day by oral route.   [DISCONTINUED] insulin glargine, 2 Unit Dial, (TOUJEO MAX SOLOSTAR) 300 UNIT/ML Solostar Pen Inject 25 Units into the skin at bedtime.   DULoxetine (CYMBALTA) 20 MG capsule Take 1  tablet by mouth daily. (Patient not taking: Reported on 10/07/2022)   No facility-administered encounter medications on file as of 12/07/2022.    ALLERGIES: Allergies  Allergen Reactions   Dapagliflozin Other (See Comments)    Nausea and vomiting   Dulaglutide     Other reaction(s): Makes her feel terrible.   Liraglutide     Other reaction(s): burping & belching    VACCINATION STATUS: Immunization History  Administered Date(s) Administered   Zoster Recombinat (Shingrix) 03/10/2022    Diabetes She presents for her follow-up diabetic visit. She has type 2 diabetes mellitus. Onset time: diagnosed at approx age of 52. Her disease course has been fluctuating. There are no hypoglycemic associated symptoms. Associated symptoms include blurred vision, fatigue, foot paresthesias, polydipsia, polyphagia, polyuria and visual change. Pertinent negatives for diabetes include no weight loss. There are no hypoglycemic complications. Symptoms are improving. Diabetic complications include peripheral neuropathy and retinopathy. Risk factors for coronary artery disease include diabetes mellitus, dyslipidemia, hypertension, obesity, sedentary lifestyle and stress. Current diabetic treatment includes insulin pump. She is compliant with treatment most of the time (recently restarted her pump). Her weight is increasing steadily. She is following a generally healthy diet. Meal planning includes avoidance of concentrated sweets. She has had a previous visit with a dietitian. She participates in exercise three times a week. Her home blood glucose trend is increasing steadily. Her breakfast blood glucose range is generally >200 mg/dl. Her lunch blood glucose range is generally >200 mg/dl. Her dinner blood glucose range is generally >200 mg/dl. Her bedtime blood glucose range is generally >200 mg/dl. Her overall blood glucose range is >200 mg/dl. (She presents today with her CGM showing gross hyperglycemia overall.  Since  last visit, she found her Omnipod DASH PDM and resumed using it at the prescribed rate from her PCP.  She was not due for another A1c today.  Analysis of her CGM shows TIR 1%, TAR 99% (84% in level 2 hyperglycemia range), TBR 0%.  She reports she does not have to eat for her glucose to go high.) An ACE inhibitor/angiotensin II receptor blocker is being taken. She does not see a podiatrist.Eye exam is current (has appt for f/u next week).  Hyperlipidemia This is a chronic problem. The current episode started  more than 1 year ago. The problem is uncontrolled. Recent lipid tests were reviewed and are high. Exacerbating diseases include chronic renal disease, diabetes and obesity. Factors aggravating her hyperlipidemia include fatty foods. Current antihyperlipidemic treatment includes statins. The current treatment provides mild improvement of lipids. Compliance problems include adherence to exercise and adherence to diet.  Risk factors for coronary artery disease include diabetes mellitus, dyslipidemia, hypertension, obesity and stress.  Hypertension This is a chronic problem. The current episode started more than 1 year ago. The problem has been resolved since onset. The problem is controlled. Associated symptoms include blurred vision. There are no associated agents to hypertension. Risk factors for coronary artery disease include diabetes mellitus, dyslipidemia, stress and obesity. Past treatments include ACE inhibitors. The current treatment provides significant improvement. There are no compliance problems.  Hypertensive end-organ damage includes kidney disease and retinopathy. Identifiable causes of hypertension include chronic renal disease.     Review of systems  Constitutional: + steadily increasing body weight,  current Body mass index is 35.96 kg/m. , no fatigue, no subjective hyperthermia, no subjective hypothermia Eyes: no blurry vision, no xerophthalmia ENT: no sore throat, no nodules  palpated in throat, no dysphagia/odynophagia, no hoarseness Cardiovascular: no chest pain, no shortness of breath, no palpitations, no leg swelling Respiratory: no cough, no shortness of breath Gastrointestinal: no nausea/vomiting/diarrhea Musculoskeletal: no muscle/joint aches Skin: no rashes, no hyperemia Neurological: no tremors, no numbness, no tingling, no dizziness Psychiatric: no depression, no anxiety  Objective:     BP 131/82 (BP Location: Left Arm, Patient Position: Sitting, Cuff Size: Large)   Pulse 89   Ht 5\' 3"  (1.6 m)   Wt 203 lb (92.1 kg)   BMI 35.96 kg/m   Wt Readings from Last 3 Encounters:  12/07/22 203 lb (92.1 kg)  10/07/22 198 lb (89.8 kg)  10/05/22 199 lb (90.3 kg)     BP Readings from Last 3 Encounters:  12/07/22 131/82  10/07/22 125/81  10/05/22 136/81      Physical Exam- Limited  Constitutional:  Body mass index is 35.96 kg/m. , not in acute distress, normal state of mind Eyes:  EOMI, no exophthalmos Musculoskeletal: no gross deformities, strength intact in all four extremities, no gross restriction of joint movements Skin:  no rashes, no hyperemia Neurological: no tremor with outstretched hands    CMP ( most recent) CMP     Component Value Date/Time   NA 141 09/25/2022 0000   K 4.5 09/25/2022 0000   CL 102 09/25/2022 0000   CO2 27 09/10/2022 0949   GLUCOSE 168 (H) 09/10/2022 0949   BUN 11 09/25/2022 0000   CREATININE 0.9 09/25/2022 0000   CREATININE 1.01 09/10/2022 0949   CALCIUM 9.5 09/25/2022 0000   PROT 6.6 09/10/2022 0949   ALBUMIN 4.2 09/25/2022 0000   AST 27 09/25/2022 0000   ALT 33 09/25/2022 0000   ALKPHOS 79 09/25/2022 0000   BILITOT 0.6 09/10/2022 0949   GFRNONAA >60 02/24/2020 1920   GFRAA 82 12/13/2020 0000     Diabetic Labs (most recent): Lab Results  Component Value Date   HGBA1C 10.0 09/25/2022   HGBA1C 13.2 06/12/2022   HGBA1C 14.5 12/13/2020   MICROALBUR 11 12/13/2020     Lipid Panel ( most  recent) Lipid Panel     Component Value Date/Time   CHOL 162 09/25/2022 0000   TRIG 94 06/12/2022 0000   HDL 51 09/25/2022 0000   LDLCALC 90 09/25/2022 0000      No results  found for: "TSH", "FREET4"         Assessment & Plan:   1) Type 2 diabetes mellitus with hyperglycemia, with long-term current use of insulin (HCC)  - [place] has currently uncontrolled symptomatic type 2 DM since 52 years of age.   She presents today with her CGM showing gross hyperglycemia overall.  Since last visit, she found her Omnipod DASH PDM and resumed using it at the prescribed rate from her PCP.  She was not due for another A1c today.  Analysis of her CGM shows TIR 1%, TAR 99% (84% in level 2 hyperglycemia range), TBR 0%.  She reports she does not have to eat for her glucose to go high.  -Recent labs reviewed.  - I had a long discussion with her about the progressive nature of diabetes and the pathology behind its complications. -her diabetes is complicated by CKD, retinopathy and neuropathy and she remains at a high risk for more acute and chronic complications which include CAD, CVA. These are all discussed in detail with her.  - Nutritional counseling repeated at each appointment due to patients tendency to fall back in to old habits.  - The patient admits there is a room for improvement in their diet and drink choices. -  Suggestion is made for the patient to avoid simple carbohydrates from their diet including Cakes, Sweet Desserts / Pastries, Ice Cream, Soda (diet and regular), Sweet Tea, Candies, Chips, Cookies, Sweet Pastries, Store Bought Juices, Alcohol in Excess of 1-2 drinks a day, Artificial Sweeteners, Coffee Creamer, and "Sugar-free" Products. This will help patient to have stable blood glucose profile and potentially avoid unintended weight gain.   - I encouraged the patient to switch to unprocessed or minimally processed complex starch and increased protein intake (animal or  plant source), fruits, and vegetables.   - Patient is advised to stick to a routine mealtimes to eat 3 meals a day and avoid unnecessary snacks (to snack only to correct hypoglycemia).  - she has missed several appts with Norm Salt, RDE for diabetes education.  - I have approached her with the following individualized plan to manage  her diabetes and patient agrees:   - I did change her Omnipod DASH settings to be more aggressive.  I changed her basal rate to 1 unit per hour, insulin carb ration 1 unit per 9 grams of carbs, Correction factor of 35, duration 4 hours, target BG of 110.   Will have her return in 1 month to make more tweaks to her settings to get her to goal.  -she is encouraged to continue monitoring glucose 4 times daily (using her CGM), before meals and before bed, and to call the clinic if she has readings less than 70 or above 300 for 3 tests in a row.  - she is warned not to take insulin without proper monitoring per orders.  - Adjustment parameters are given to her for hypo and hyperglycemia in writing.  - she will be considered for incretin therapy as appropriate next visit.  - Specific targets for  A1c;  LDL, HDL,  and Triglycerides were discussed with the patient.  2) Blood Pressure /Hypertension:  her blood pressure is controlled to target.   she is advised to continue her current medications including Lisinopril 5 mg p.o. daily with breakfast.  3) Lipids/Hyperlipidemia:    Review of her recent lipid panel from 09/25/22 showed controlled LDL at 90 .  she  is advised to continue Simvastatin 40  mg daily at bedtime.  Side effects and precautions discussed with her.  4)  Weight/Diet:  her Body mass index is 35.96 kg/m.  -  clearly complicating her diabetes care.   she is a candidate for weight loss. I discussed with her the fact that loss of 5 - 10% of her  current body weight will have the most impact on her diabetes management.  Exercise, and detailed  carbohydrates information provided  -  detailed on discharge instructions.  5) Vitamin D Deficiency: Her most recent vitamin D level was 13.4 on 12/13/20.  She does not appear to be on any supplementation.  Will recheck on subsequent visits.  6) Chronic Care/Health Maintenance: -she is on ACEI/ARB and Statin medications and is encouraged to initiate and continue to follow up with Ophthalmology, Dentist, Podiatrist at least yearly or according to recommendations, and advised to stay away from smoking. I have recommended yearly flu vaccine and pneumonia vaccine at least every 5 years; moderate intensity exercise for up to 150 minutes weekly; and sleep for at least 7 hours a day.  - she is advised to maintain close follow up with Benita Stabile, MD for primary care needs, as well as her other providers for optimal and coordinated care.     I spent 54 minutes in the care of the patient today including review of labs from CMP, Lipids, Thyroid Function, Hematology (current and previous including abstractions from other facilities); face-to-face time discussing  her blood glucose readings/logs, discussing hypoglycemia and hyperglycemia episodes and symptoms, medications doses, her options of short and long term treatment based on the latest standards of care / guidelines;  discussion about incorporating lifestyle medicine;  and documenting the encounter. Risk reduction counseling performed per USPSTF guidelines to reduce obesity and cardiovascular risk factors.     Please refer to Patient Instructions for Blood Glucose Monitoring and Insulin/Medications Dosing Guide"  in media tab for additional information. Please  also refer to " Patient Self Inventory" in the Media  tab for reviewed elements of pertinent patient history.  [name] participated in the discussions, expressed understanding, and voiced agreement with the above plans.  All questions were answered to her satisfaction. she is encouraged to  contact clinic should she have any questions or concerns prior to her return visit.   Follow up plan: - Return in about 1 month (around 01/07/2023) for Diabetes F/U, Bring meter and logs.  Ronny Bacon, [facility] [address] [place], [place] [address] Phone: [phone] Fax: [phone]  12/07/2022, 4:05 PM

## 2022-12-07 NOTE — Addendum Note (Signed)
Addended by: Brita Romp on: 12/07/2022 04:07 PM   Modules accepted: Level of Service

## 2023-01-02 NOTE — Progress Notes (Unsigned)
Office Visit Note  Patient: Lacey Bradshaw             Date of Birth: January 17, 1971           MRN: [id number]             PCP: Celene Squibb, MD Referring: Celene Squibb, MD Visit Date: 01/04/2023   Subjective:  No chief complaint on file.   History of Present Illness: Lacey Bradshaw is a 52 y.o. female here for follow up ***   Previous HPI 10/07/22 Lacey Bradshaw is a 52 y.o. female here for follow up for multiple joint and muscle pains with a low positive rheumatoid factor.  Labs checked at initial evaluation were now negative for the rheumatoid factor test.  ANA checked was very low at 1: 40 homogenous and serum inflammatory markers were normal.  Symptoms remain about the same with fairly generalized joint pains.   Previous HPI 09/10/22 Lacey Bradshaw is a 52 y.o. female here for evaluation of positive rheumatoid factor associated with joint and muscle pains in multiple areas. Notices pain worse in the past year. Seems to vary in locations with some getting worse at different times including shoulders, wrists, fingers, hips, and back. No problem in her knees and feet. She sometimes sees swelling in affected areas but often just pain and stiffness. Stiffness for 1-2 hours in the morning and a few minutes whenever getting up and moving after stationary position. She has seen spine specialist for chronic lumbar pain. One steroid injection was delayed due to severe hyperglycemia at the time. She has left hip pain treated with local steroid injection in orthopedic surgery clinic this helped for a few weeks. Takes tylenol and aleve as needed usually just 1-2 times per day. She is prescribed celebrex 200 mg daily as well, in addition to gabapentin, lyrica, and cymbalta for chronic pain especially in the neck and back. Previously took low dose hydrocodone but not on any opioid pain medication currently.   Labs reviewed RF 17.5 CCP neg ESR 2 CRP <1   No Rheumatology ROS completed.   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   Major depressive disorder 03/09/2022   Chronic pain syndrome 11/20/2021   Neuropathy 08/15/2021   Abnormal vision 08/15/2021   Chronic insomnia 08/15/2021   Contact dermatitis 08/15/2021   Dizziness 08/15/2021   Cataracts, bilateral 06/28/2021   Type 2 diabetes mellitus ([place]) 03/21/2021   Gastroesophageal reflux disease without esophagitis 03/21/2021   Migraine 03/21/2021   Morbid obesity ([place]) 03/21/2021   Mixed hyperlipidemia 03/21/2021   Chronic bilateral low back pain 10/17/2018   Spondylolisthesis, lumbar region 07/04/2018   Essential (primary) hypertension 07/04/2018   Carpal tunnel syndrome 06/01/2013    Past Medical History:  Diagnosis Date   Acid reflux    Bulging of lumbar intervertebral disc    Diabetes mellitus without complication ([place])    borderline   Diabetes mellitus, type II (HCC)    Hyperlipidemia    Hyposomnia, insomnia or sleeplessness associated with anxiety    Migraine     Family History  Problem Relation Age of Onset   Cancer Mother    Congestive Heart Failure Father    Hypertension Father    High Cholesterol Father  Stroke Father    Heart attack Father    Diabetes Sister    COPD Sister    Congestive Heart Failure Sister    Arthritis Sister    Diabetes Sister    Diabetes Sister    Diabetes Sister    Diabetes Sister    Diabetes Sister    Diabetes Sister    Hypertension Brother    Diabetes Brother    Healthy Daughter    Past Surgical History:  Procedure Laterality Date   ABDOMINAL HYSTERECTOMY     2009   CARPAL TUNNEL RELEASE Left    CATARACT EXTRACTION Bilateral 03/2022   FRACTURE SURGERY     jaw   TUBAL LIGATION     Social History    Social History Narrative   Not on file   Immunization History  Administered Date(s) Administered   Zoster Recombinat (Shingrix) 03/10/2022     Objective: Vital Signs: There were no vitals taken for this visit.   Physical Exam   Musculoskeletal Exam: ***  CDAI Exam: CDAI Score: -- Patient Global: --; Provider Global: -- Swollen: --; Tender: -- Joint Exam 01/04/2023   No joint exam has been documented for this visit   There is currently no information documented on the homunculus. Go to the Rheumatology activity and complete the homunculus joint exam.  Investigation: No additional findings.  Imaging: No results found.  Recent Labs: Lab Results  Component Value Date   WBC 7.3 02/24/2020   HGB 14.1 02/24/2020   PLT 214 02/24/2020   NA 141 09/25/2022   K 4.5 09/25/2022   CL 102 09/25/2022   CO2 27 09/10/2022   GLUCOSE 168 (H) 09/10/2022   BUN 11 09/25/2022   CREATININE 0.9 09/25/2022   BILITOT 0.6 09/10/2022   ALKPHOS 79 09/25/2022   AST 27 09/25/2022   ALT 33 09/25/2022   PROT 6.6 09/10/2022   ALBUMIN 4.2 09/25/2022   CALCIUM 9.5 09/25/2022   GFRAA 82 12/13/2020    Speciality Comments: No specialty comments available.  Procedures:  No procedures performed Allergies: Dapagliflozin, Dulaglutide, and Liraglutide   Assessment / Plan:     Visit Diagnoses: No diagnosis found.  ***  Orders: No orders of the defined types were placed in this encounter.  No orders of the defined types were placed in this encounter.    Follow-Up Instructions: No follow-ups on file.   Collier Salina, MD  Note - This record has been created using [employer].  Chart creation errors have been sought, but may not always  have been located. Such creation errors do not reflect on  the standard of medical care.

## 2023-01-04 ENCOUNTER — Ambulatory Visit: Payer: Medicaid Other | Attending: Internal Medicine | Admitting: Internal Medicine

## 2023-01-04 ENCOUNTER — Encounter: Payer: Self-pay | Admitting: Internal Medicine

## 2023-01-04 VITALS — BP 117/77 | HR 89 | Resp 16 | Ht 63.0 in | Wt 207.0 lb

## 2023-01-04 DIAGNOSIS — G8929 Other chronic pain: Secondary | ICD-10-CM | POA: Insufficient documentation

## 2023-01-04 DIAGNOSIS — M25511 Pain in right shoulder: Secondary | ICD-10-CM | POA: Diagnosis not present

## 2023-01-04 DIAGNOSIS — M25512 Pain in left shoulder: Secondary | ICD-10-CM | POA: Insufficient documentation

## 2023-01-04 DIAGNOSIS — M5412 Radiculopathy, cervical region: Secondary | ICD-10-CM | POA: Diagnosis not present

## 2023-01-04 DIAGNOSIS — G894 Chronic pain syndrome: Secondary | ICD-10-CM | POA: Insufficient documentation

## 2023-01-04 MED ORDER — TRIAMCINOLONE ACETONIDE 40 MG/ML IJ SUSP
40.0000 mg | INTRAMUSCULAR | Status: AC | PRN
  Administered 2023-01-04: 40 mg via INTRA_ARTICULAR

## 2023-01-04 MED ORDER — LIDOCAINE HCL 1 % IJ SOLN
3.0000 mL | INTRAMUSCULAR | Status: AC | PRN
  Administered 2023-01-04: 3 mL

## 2023-01-07 ENCOUNTER — Encounter: Payer: Self-pay | Admitting: Nurse Practitioner

## 2023-01-07 ENCOUNTER — Ambulatory Visit (INDEPENDENT_AMBULATORY_CARE_PROVIDER_SITE_OTHER): Payer: Medicaid Other | Admitting: Nurse Practitioner

## 2023-01-07 VITALS — BP 132/79 | HR 89 | Ht 63.0 in | Wt 206.0 lb

## 2023-01-07 DIAGNOSIS — E782 Mixed hyperlipidemia: Secondary | ICD-10-CM | POA: Diagnosis not present

## 2023-01-07 DIAGNOSIS — E1165 Type 2 diabetes mellitus with hyperglycemia: Secondary | ICD-10-CM

## 2023-01-07 DIAGNOSIS — Z794 Long term (current) use of insulin: Secondary | ICD-10-CM | POA: Diagnosis not present

## 2023-01-07 DIAGNOSIS — I10 Essential (primary) hypertension: Secondary | ICD-10-CM

## 2023-01-07 LAB — POCT GLYCOSYLATED HEMOGLOBIN (HGB A1C): Hemoglobin A1C: 9.5 % — AB (ref 4.0–5.6)

## 2023-01-07 MED ORDER — OMNIPOD DASH PODS (GEN 4) MISC: 6 refills | Status: DC

## 2023-01-07 NOTE — Progress Notes (Signed)
Endocrinology Follow Up Note       01/07/2023, 3:32 PM   Subjective:    Patient ID: Lacey Bradshaw, female    DOB: Mar 22, 1971.  Lacey Bradshaw is being seen in follow up after being seen in consultation for management of currently uncontrolled symptomatic diabetes requested by  Celene Squibb, MD.   Past Medical History:  Diagnosis Date   Acid reflux    Bulging of lumbar intervertebral disc    Diabetes mellitus without complication ([place])    borderline   Diabetes mellitus, type II ([place])    Hyperlipidemia    Hyposomnia, insomnia or sleeplessness associated with anxiety    Migraine     Past Surgical History:  Procedure Laterality Date   ABDOMINAL HYSTERECTOMY     2009   CARPAL TUNNEL RELEASE Left    CATARACT EXTRACTION Bilateral 03/2022   FRACTURE SURGERY     jaw   TUBAL LIGATION      Social History   Socioeconomic History   Marital status: Single    Spouse name: Not on file   Number of children: Not on file   Years of education: Not on file   Highest education level: Not on file  Occupational History   Not on file  Tobacco Use   Smoking status: Never    Passive exposure: Past   Smokeless tobacco: Never  Vaping Use   Vaping Use: Never used  Substance and Sexual Activity   Alcohol use: No   Drug use: No   Sexual activity: Not on file  Other Topics Concern   Not on file  Social History Narrative   Not on file   Social Determinants of Health   Financial Resource Strain: Not on file  Food Insecurity: Not on file  Transportation Needs: Not on file  Physical Activity: Not on file  Stress: Not on file  Social Connections: Not on file    Family History  Problem Relation Age of Onset   Cancer Mother    Congestive Heart Failure Father    Hypertension Father    High Cholesterol Father    Stroke Father    Heart attack Father    Diabetes Sister    COPD Sister    Congestive Heart  Failure Sister    Arthritis Sister    Diabetes Sister    Diabetes Sister    Diabetes Sister    Diabetes Sister    Diabetes Sister    Diabetes Sister    Hypertension Brother    Diabetes Brother    Healthy Daughter     Outpatient Encounter Medications as of 01/07/2023  Medication Sig   ALPRAZolam (XANAX) 1 MG tablet Take 1 mg by mouth 2 (two) times daily as needed. anxiety   alprazolam (XANAX) 2 MG tablet Take 2 mg by mouth at bedtime.   atorvastatin (LIPITOR) 40 MG tablet TAKE 1 TABLET BY MOUTH EVERYDAY AT BEDTIME   BD VEO INSULIN SYRINGE U/F 31G X 15/64" 0.3 ML MISC ONE SYRINGE PER INSULIN USE   celecoxib (CELEBREX) 200 MG capsule Take 200 mg by mouth 2 (two) times daily.   cetirizine (ZYRTEC) 10 MG tablet Take 10 mg by mouth as  needed.   Continuous Blood Gluc Sensor (DEXCOM G6 SENSOR) MISC Change sensor every 10 days as directed   Continuous Blood Gluc Transmit (DEXCOM G6 TRANSMITTER) MISC Change transmitter every 90 days as directed.   gabapentin (NEURONTIN) 300 MG capsule Take 300 mg by mouth as needed.   hydrALAZINE (APRESOLINE) 25 MG tablet Take by mouth daily.   HYDROcodone-acetaminophen (NORCO) 10-325 MG tablet 1 tablet PO BID   NOVOLOG 100 UNIT/ML injection Inject 10-16 Units into the skin with breakfast, with lunch, and with evening meal.   nystatin cream (MYCOSTATIN) Apply 1 application  topically as needed.   pantoprazole (PROTONIX) 40 MG tablet Take 40 mg by mouth 2 (two) times daily.   triamcinolone cream (KENALOG) 0.1 % APPLY A LIBERAL AMOUNT TO AFFECTED AREAS TWICE DAILY FOR 14 DAYS   valACYclovir (VALTREX) 1000 MG tablet Take 1,000 mg by mouth daily.   VENTOLIN HFA 108 (90 Base) MCG/ACT inhaler INHALE 2 PUFFS INTO THE LUNGS EVERY 4 HOURS FOR 30 DAYS   vortioxetine HBr (TRINTELLIX) 5 MG TABS tablet Take 1 tablet every day by oral route.   [DISCONTINUED] Insulin Disposable Pump (OMNIPOD DASH PODS, GEN 4,) MISC See admin instructions.   DULoxetine (CYMBALTA) 20 MG  capsule Take 1 tablet by mouth daily. (Patient not taking: Reported on 10/07/2022)   Insulin Disposable Pump (OMNIPOD DASH PODS, GEN 4,) MISC Change pod every 24-48 hours as directed   pregabalin (LYRICA) 100 MG capsule Take 100 mg by mouth 3 (three) times daily.   No facility-administered encounter medications on file as of 01/07/2023.    ALLERGIES: Allergies  Allergen Reactions   Dapagliflozin Other (See Comments)    Nausea and vomiting   Dulaglutide     Other reaction(s): Makes her feel terrible.   Liraglutide     Other reaction(s): burping & belching    VACCINATION STATUS: Immunization History  Administered Date(s) Administered   Zoster Recombinat (Shingrix) 03/10/2022    Diabetes She presents for her follow-up diabetic visit. She has type 2 diabetes mellitus. Onset time: diagnosed at approx age of 52. Her disease course has been fluctuating. There are no hypoglycemic associated symptoms. Associated symptoms include blurred vision, fatigue, foot paresthesias, polydipsia, polyphagia, polyuria and visual change. Pertinent negatives for diabetes include no weight loss. There are no hypoglycemic complications. Symptoms are worsening. Diabetic complications include peripheral neuropathy and retinopathy. Risk factors for coronary artery disease include diabetes mellitus, dyslipidemia, hypertension, obesity, sedentary lifestyle and stress. Current diabetic treatment includes insulin pump. She is compliant with treatment most of the time. Her weight is increasing steadily. She is following a generally unhealthy diet. Meal planning includes avoidance of concentrated sweets. She has had a previous visit with a dietitian. She participates in exercise three times a week. Her home blood glucose trend is increasing steadily. Her breakfast blood glucose range is generally >200 mg/dl. Her lunch blood glucose range is generally >200 mg/dl. Her dinner blood glucose range is generally >200 mg/dl. Her  bedtime blood glucose range is generally >200 mg/dl. Her overall blood glucose range is >200 mg/dl. (She presents today with her CGM showing gross hyperglycemia overall.  She notes she has had sinus infection for about 3 weeks now and has just had a steroid injection recently as well.  Analysis of her CGM shows TIR 8%, TAR 92%, TBR 0%, with a GMI of 10.6%.  Her POCT A1c today is 9.5%, improving slightly from last visit of 10%.  She is also frustrated with her recent weight gain, but  admits she has at times eaten what she wants and gave herself more insulin to cover.) An ACE inhibitor/angiotensin II receptor blocker is being taken. She does not see a podiatrist.Eye exam is current (has appt for f/u next week).  Hyperlipidemia This is a chronic problem. The current episode started more than 1 year ago. The problem is uncontrolled. Recent lipid tests were reviewed and are high. Exacerbating diseases include chronic renal disease, diabetes and obesity. Factors aggravating her hyperlipidemia include fatty foods. Current antihyperlipidemic treatment includes statins. The current treatment provides mild improvement of lipids. Compliance problems include adherence to exercise and adherence to diet.  Risk factors for coronary artery disease include diabetes mellitus, dyslipidemia, hypertension, obesity and stress.  Hypertension This is a chronic problem. The current episode started more than 1 year ago. The problem has been resolved since onset. The problem is controlled. Associated symptoms include blurred vision. There are no associated agents to hypertension. Risk factors for coronary artery disease include diabetes mellitus, dyslipidemia, stress and obesity. Past treatments include ACE inhibitors. The current treatment provides significant improvement. There are no compliance problems.  Hypertensive end-organ damage includes kidney disease and retinopathy. Identifiable causes of hypertension include chronic renal  disease.     Review of systems  Constitutional: + steadily increasing body weight,  current Body mass index is 36.49 kg/m. , no fatigue, no subjective hyperthermia, no subjective hypothermia Cardiovascular: no chest pain, no shortness of breath, no palpitations, no leg swelling Respiratory: no cough, no shortness of breath Gastrointestinal: no nausea/vomiting/diarrhea Musculoskeletal: no muscle/joint aches Skin: no rashes, no hyperemia, + wound to left thigh- seeing PCP for this soon Neurological: no tremors, no numbness, no tingling, no dizziness Psychiatric: no depression, no anxiety  Objective:     BP 132/79 (BP Location: Left Arm, Patient Position: Sitting, Cuff Size: Large)   Pulse 89   Ht '5\' 3"'$  (1.6 m)   Wt 206 lb (93.4 kg)   BMI 36.49 kg/m   Wt Readings from Last 3 Encounters:  01/07/23 206 lb (93.4 kg)  01/04/23 207 lb (93.9 kg)  12/07/22 203 lb (92.1 kg)     BP Readings from Last 3 Encounters:  01/07/23 132/79  01/04/23 117/77  12/07/22 131/82      Physical Exam- Limited  Constitutional:  Body mass index is 36.49 kg/m. , not in acute distress, normal state of mind Eyes:  EOMI, no exophthalmos Musculoskeletal: no gross deformities, strength intact in all four extremities, no gross restriction of joint movements Skin:  no rashes, no hyperemia Neurological: no tremor with outstretched hands    CMP ( most recent) CMP     Component Value Date/Time   NA 141 09/25/2022 0000   K 4.5 09/25/2022 0000   CL 102 09/25/2022 0000   CO2 27 09/10/2022 0949   GLUCOSE 168 (H) 09/10/2022 0949   BUN 11 09/25/2022 0000   CREATININE 0.9 09/25/2022 0000   CREATININE 1.01 09/10/2022 0949   CALCIUM 9.5 09/25/2022 0000   PROT 6.6 09/10/2022 0949   ALBUMIN 4.2 09/25/2022 0000   AST 27 09/25/2022 0000   ALT 33 09/25/2022 0000   ALKPHOS 79 09/25/2022 0000   BILITOT 0.6 09/10/2022 0949   GFRNONAA >60 02/24/2020 1920   GFRAA 82 12/13/2020 0000     Diabetic Labs (most  recent): Lab Results  Component Value Date   HGBA1C 9.5 (A) 01/07/2023   HGBA1C 10.0 09/25/2022   HGBA1C 13.2 06/12/2022   MICROALBUR 11 12/13/2020     Lipid Panel (  most recent) Lipid Panel     Component Value Date/Time   CHOL 162 09/25/2022 0000   TRIG 94 06/12/2022 0000   HDL 51 09/25/2022 0000   LDLCALC 90 09/25/2022 0000      No results found for: "TSH", "FREET4"         Assessment & Plan:   1) Type 2 diabetes mellitus with hyperglycemia, with long-term current use of insulin ([place])  - [place] has currently uncontrolled symptomatic type 2 DM since 52 years of age.   She presents today with her CGM showing gross hyperglycemia overall.  She notes she has had sinus infection for about 3 weeks now and has just had a steroid injection recently as well.  Analysis of her CGM shows TIR 8%, TAR 92%, TBR 0%, with a GMI of 10.6%.  Her POCT A1c today is 9.5%, improving slightly from last visit of 10%.  She is also frustrated with her recent weight gain, but admits she has at times eaten what she wants and gave herself more insulin to cover.  -Recent labs reviewed.  - I had a long discussion with her about the progressive nature of diabetes and the pathology behind its complications. -her diabetes is complicated by CKD, retinopathy and neuropathy and she remains at a high risk for more acute and chronic complications which include CAD, CVA. These are all discussed in detail with her.  - Nutritional counseling repeated at each appointment due to patients tendency to fall back in to old habits.  - The patient admits there is a room for improvement in their diet and drink choices. -  Suggestion is made for the patient to avoid simple carbohydrates from their diet including Cakes, Sweet Desserts / Pastries, Ice Cream, Soda (diet and regular), Sweet Tea, Candies, Chips, Cookies, Sweet Pastries, Store Bought Juices, Alcohol in Excess of 1-2 drinks a day, Artificial Sweeteners,  Coffee Creamer, and "Sugar-free" Products. This will help patient to have stable blood glucose profile and potentially avoid unintended weight gain.   - I encouraged the patient to switch to unprocessed or minimally processed complex starch and increased protein intake (animal or plant source), fruits, and vegetables.   - Patient is advised to stick to a routine mealtimes to eat 3 meals a day and avoid unnecessary snacks (to snack only to correct hypoglycemia).  - she has missed several appts with Jearld Fenton, RDE for diabetes education.  - I have approached her with the following individualized plan to manage  her diabetes and patient agrees:   - I did change her Omnipod DASH settings to be more aggressive.  I changed her basal rate to 1.5 units per hour, and changed her insulin active time from 4 hours to 3 hours.  -she is encouraged to continue monitoring glucose 4 times daily (using her CGM), before meals and before bed, and to call the clinic if she has readings less than 70 or above 300 for 3 tests in a row.  - she is warned not to take insulin without proper monitoring per orders.  - Adjustment parameters are given to her for hypo and hyperglycemia in writing.  - she will be considered for incretin therapy as appropriate next visit.  - Specific targets for  A1c;  LDL, HDL,  and Triglycerides were discussed with the patient.  2) Blood Pressure /Hypertension:  her blood pressure is controlled to target.   she is advised to continue her current medications including Lisinopril 5 mg p.o. daily with  breakfast.  3) Lipids/Hyperlipidemia:    Review of her recent lipid panel from 09/25/22 showed controlled LDL at 90 .  she  is advised to continue Simvastatin 40 mg daily at bedtime.  Side effects and precautions discussed with her.  4)  Weight/Diet:  her Body mass index is 36.49 kg/m.  -  clearly complicating her diabetes care.   she is a candidate for weight loss. I discussed with her  the fact that loss of 5 - 10% of her  current body weight will have the most impact on her diabetes management.  Exercise, and detailed carbohydrates information provided  -  detailed on discharge instructions.  5) Vitamin D Deficiency: Her most recent vitamin D level was 13.4 on 12/13/20.  She does not appear to be on any supplementation.  Will recheck on subsequent visits.  6) Chronic Care/Health Maintenance: -she is on ACEI/ARB and Statin medications and is encouraged to initiate and continue to follow up with Ophthalmology, Dentist, Podiatrist at least yearly or according to recommendations, and advised to stay away from smoking. I have recommended yearly flu vaccine and pneumonia vaccine at least every 5 years; moderate intensity exercise for up to 150 minutes weekly; and sleep for at least 7 hours a day.  - she is advised to maintain close follow up with Celene Squibb, MD for primary care needs, as well as her other providers for optimal and coordinated care.     I spent  34  minutes in the care of the patient today including review of labs from [place], Lipids, Thyroid Function, Hematology (current and previous including abstractions from other facilities); face-to-face time discussing  her blood glucose readings/logs, discussing hypoglycemia and hyperglycemia episodes and symptoms, medications doses, her options of short and long term treatment based on the latest standards of care / guidelines;  discussion about incorporating lifestyle medicine;  and documenting the encounter. Risk reduction counseling performed per USPSTF guidelines to reduce obesity and cardiovascular risk factors.     Please refer to Patient Instructions for Blood Glucose Monitoring and Insulin/Medications Dosing Guide"  in media tab for additional information. Please  also refer to " Patient Self Inventory" in the Media  tab for reviewed elements of pertinent patient history.  Lacey Bradshaw participated in the discussions,  expressed understanding, and voiced agreement with the above plans.  All questions were answered to her satisfaction. she is encouraged to contact clinic should she have any questions or concerns prior to her return visit.   Follow up plan: - Return in about 3 months (around 04/07/2023) for Diabetes F/U with A1c in office, No previsit labs, Bring meter and logs.  Rayetta Pigg, [facility] [address] [place], [place] [address] Phone: [phone] Fax: [phone]  01/07/2023, 3:32 PM

## 2023-02-10 ENCOUNTER — Encounter: Payer: Self-pay | Admitting: "Endocrinology

## 2023-02-10 LAB — HM DIABETES EYE EXAM

## 2023-02-22 ENCOUNTER — Other Ambulatory Visit: Payer: Self-pay | Admitting: *Deleted

## 2023-02-22 DIAGNOSIS — E1165 Type 2 diabetes mellitus with hyperglycemia: Secondary | ICD-10-CM

## 2023-02-22 MED ORDER — DEXCOM G6 SENSOR MISC: 3 refills | Status: DC

## 2023-02-22 NOTE — Telephone Encounter (Signed)
Patient left a message that she needed a refill on her Dexcom  sensors, the request was [facility] [place], [place]

## 2023-02-25 ENCOUNTER — Telehealth: Payer: Self-pay | Admitting: Nurse Practitioner

## 2023-02-25 NOTE — Telephone Encounter (Signed)
Received medical record request from Disability Determination. Sent to [facility] HIM

## 2023-03-01 ENCOUNTER — Other Ambulatory Visit (HOSPITAL_COMMUNITY): Payer: Self-pay

## 2023-03-02 ENCOUNTER — Telehealth: Payer: Self-pay | Admitting: *Deleted

## 2023-03-02 ENCOUNTER — Telehealth: Payer: Self-pay | Admitting: Pharmacy Technician

## 2023-03-02 ENCOUNTER — Other Ambulatory Visit (HOSPITAL_COMMUNITY): Payer: Self-pay

## 2023-03-02 NOTE — Telephone Encounter (Signed)
Pharmacy Patient Advocate Encounter   Received notification from Pt calls msgs/LPN that prior authorization for Dexcom is required/requested.   PA submitted on 03/02/23 to (ins) Medicaid via [employer] or [facility]) confirmation # [id number] W (sensors), [phone] W (transmitter) Status is pending

## 2023-03-02 NOTE — Telephone Encounter (Signed)
[facility] states that [facility] sent in to [place] a PA for patient's Dexcom sensors.  I have not seen this. I have sent a message to Selena Batten to see if she has seen it and I am also sending to the Rx Pa team to see if they may have a status on this.  Patient will be made aware.

## 2023-03-02 NOTE — Telephone Encounter (Signed)
PA request submitted. New Encounter created by RX Prior Auth Team for follow up. For additional info see Prior Auth telephone encounter from 03/02/23.

## 2023-03-04 ENCOUNTER — Encounter (HOSPITAL_COMMUNITY): Payer: Self-pay | Admitting: Internal Medicine

## 2023-03-04 ENCOUNTER — Other Ambulatory Visit: Payer: Self-pay | Admitting: Internal Medicine

## 2023-03-04 DIAGNOSIS — Z1231 Encounter for screening mammogram for malignant neoplasm of breast: Secondary | ICD-10-CM

## 2023-03-10 ENCOUNTER — Other Ambulatory Visit (HOSPITAL_COMMUNITY): Payer: Self-pay | Admitting: Internal Medicine

## 2023-03-10 DIAGNOSIS — N6310 Unspecified lump in the right breast, unspecified quadrant: Secondary | ICD-10-CM

## 2023-03-11 ENCOUNTER — Other Ambulatory Visit (HOSPITAL_COMMUNITY): Payer: Self-pay

## 2023-03-11 NOTE — Telephone Encounter (Signed)
Patient called and a message was left with the information about the approval.

## 2023-03-11 NOTE — Telephone Encounter (Signed)
Pharmacy Patient Advocate Encounter  Prior Authorization for Dexcom G6 Sensors and transmitter has been approved by [place] Medicaid (ins).    PA # [id number], [id number]  Effective dates: 03/02/23 through 03/01/24  Per test claim: sensors filled 03/02/23. Per EPIC Transmitter filled 02/16/23

## 2023-03-23 ENCOUNTER — Ambulatory Visit (HOSPITAL_COMMUNITY): Payer: Medicaid Other

## 2023-03-23 ENCOUNTER — Inpatient Hospital Stay (HOSPITAL_COMMUNITY): Admission: RE | Admit: 2023-03-23 | Payer: Medicaid Other | Source: Ambulatory Visit

## 2023-03-23 ENCOUNTER — Emergency Department (HOSPITAL_COMMUNITY)
Admission: EM | Admit: 2023-03-23 | Discharge: 2023-03-23 | Disposition: A | Discharge status: Home / Self Care | Payer: Medicaid Other | Attending: Emergency Medicine | Admitting: Emergency Medicine

## 2023-03-23 ENCOUNTER — Ambulatory Visit (HOSPITAL_COMMUNITY): Admission: RE | Admit: 2023-03-23 | Payer: Medicaid Other | Source: Ambulatory Visit

## 2023-03-23 ENCOUNTER — Encounter (HOSPITAL_COMMUNITY): Payer: Self-pay

## 2023-03-23 ENCOUNTER — Other Ambulatory Visit: Payer: Self-pay

## 2023-03-23 DIAGNOSIS — I10 Essential (primary) hypertension: Secondary | ICD-10-CM | POA: Insufficient documentation

## 2023-03-23 DIAGNOSIS — Z794 Long term (current) use of insulin: Secondary | ICD-10-CM | POA: Insufficient documentation

## 2023-03-23 DIAGNOSIS — E1165 Type 2 diabetes mellitus with hyperglycemia: Secondary | ICD-10-CM | POA: Diagnosis not present

## 2023-03-23 DIAGNOSIS — K5900 Constipation, unspecified: Secondary | ICD-10-CM | POA: Insufficient documentation

## 2023-03-23 DIAGNOSIS — R112 Nausea with vomiting, unspecified: Secondary | ICD-10-CM | POA: Insufficient documentation

## 2023-03-23 DIAGNOSIS — R739 Hyperglycemia, unspecified: Secondary | ICD-10-CM

## 2023-03-23 DIAGNOSIS — Z79899 Other long term (current) drug therapy: Secondary | ICD-10-CM | POA: Insufficient documentation

## 2023-03-23 LAB — CBC WITH DIFFERENTIAL/PLATELET
Abs Immature Granulocytes: 0.02 10*3/uL (ref 0.00–0.07)
Basophils Absolute: 0 10*3/uL (ref 0.0–0.1)
Basophils Relative: 0 %
Eosinophils Absolute: 0 10*3/uL (ref 0.0–0.5)
Eosinophils Relative: 0 %
HCT: 38.4 % (ref 36.0–46.0)
Hemoglobin: 13.4 g/dL (ref 12.0–15.0)
Immature Granulocytes: 0 %
Lymphocytes Relative: 11 %
Lymphs Abs: 1.1 10*3/uL (ref 0.7–4.0)
MCH: 30.1 pg (ref 26.0–34.0)
MCHC: 34.9 g/dL (ref 30.0–36.0)
MCV: 86.3 fL (ref 80.0–100.0)
Monocytes Absolute: 0.4 10*3/uL (ref 0.1–1.0)
Monocytes Relative: 4 %
Neutro Abs: 8.3 10*3/uL — ABNORMAL HIGH (ref 1.7–7.7)
Neutrophils Relative %: 85 %
Platelets: 183 10*3/uL (ref 150–400)
RBC: 4.45 MIL/uL (ref 3.87–5.11)
RDW: 12.2 % (ref 11.5–15.5)
WBC: 9.8 10*3/uL (ref 4.0–10.5)
nRBC: 0 % (ref 0.0–0.2)

## 2023-03-23 LAB — COMPREHENSIVE METABOLIC PANEL
ALT: 19 U/L (ref 0–44)
AST: 18 U/L (ref 15–41)
Albumin: 3.9 g/dL (ref 3.5–5.0)
Alkaline Phosphatase: 90 U/L (ref 38–126)
Anion gap: 10 (ref 5–15)
BUN: 14 mg/dL (ref 6–20)
CO2: 24 mmol/L (ref 22–32)
Calcium: 8.8 mg/dL — ABNORMAL LOW (ref 8.9–10.3)
Chloride: 101 mmol/L (ref 98–111)
Creatinine, Ser: 1 mg/dL (ref 0.44–1.00)
GFR, Estimated: >60 mL/min (ref >60)
Glucose, Bld: 417 mg/dL — ABNORMAL HIGH (ref 70–99)
Potassium: 4.3 mmol/L (ref 3.5–5.1)
Sodium: 135 mmol/L (ref 135–145)
Total Bilirubin: 1 mg/dL (ref 0.3–1.2)
Total Protein: 6.9 g/dL (ref 6.5–8.1)

## 2023-03-23 LAB — CBG MONITORING, ED
Glucose-Capillary: 341 mg/dL — ABNORMAL HIGH (ref 70–99)
Glucose-Capillary: 349 mg/dL — ABNORMAL HIGH (ref 70–99)

## 2023-03-23 LAB — URINALYSIS, ROUTINE W REFLEX MICROSCOPIC
Bacteria, UA: NONE SEEN
Bilirubin Urine: NEGATIVE
Glucose, UA: >=500 mg/dL — AB
Hgb urine dipstick: NEGATIVE
Ketones, ur: 20 mg/dL — AB
Leukocytes,Ua: NEGATIVE
Nitrite: NEGATIVE
Protein, ur: NEGATIVE mg/dL
Specific Gravity, Urine: 1.03 (ref 1.005–1.030)
pH: 7 (ref 5.0–8.0)

## 2023-03-23 LAB — LIPASE, BLOOD: Lipase: 51 U/L (ref 11–51)

## 2023-03-23 MED ORDER — LACTATED RINGERS IV BOLUS
1000.0000 mL | Freq: Once | INTRAVENOUS | Status: AC
  Administered 2023-03-23: 1000 mL via INTRAVENOUS

## 2023-03-23 MED ORDER — DOCUSATE SODIUM 100 MG PO CAPS: 100.0000 mg | ORAL_CAPSULE | Freq: Two times a day (BID) | ORAL | 0 refills | Status: DC

## 2023-03-23 MED ORDER — INSULIN ASPART 100 UNIT/ML IJ SOLN
10.0000 [IU] | Freq: Once | INTRAMUSCULAR | Status: AC
  Administered 2023-03-23: 10 [IU] via SUBCUTANEOUS
  Filled 2023-03-23: qty 1

## 2023-03-23 MED ORDER — ONDANSETRON 4 MG PO TBDP: 4.0000 mg | ORAL_TABLET | Freq: Three times a day (TID) | ORAL | 0 refills | Status: DC | PRN

## 2023-03-23 MED ORDER — POLYETHYLENE GLYCOL 3350 17 G PO PACK: 17.0000 g | PACK | Freq: Every day | ORAL | 0 refills | Status: DC

## 2023-03-23 MED ORDER — METOCLOPRAMIDE HCL 5 MG/ML IJ SOLN
10.0000 mg | Freq: Once | INTRAMUSCULAR | Status: AC
  Administered 2023-03-23: 10 mg via INTRAVENOUS
  Filled 2023-03-23: qty 2

## 2023-03-23 NOTE — Discharge Instructions (Addendum)
Thank you for coming to [facility] Emergency Department. You were seen for nausea/vomiting. We did an exam, labs, and imaging, and these showed just that your blood sugar was high.  Your nausea vomiting was improved by IV antinausea medicines and you were given a liter of fluid as well as some insulin.  Your symptoms significantly improved as well when you had a bowel movement.  We have given you Zofran 4 mg under the tongue to take every 6-8 hours as needed for nausea or vomiting.  Please take 1-2 capfuls of MiraLAX per day and add Colace 100 mg twice per day for constipation.  Please stay well-hydrated at home and follow-up with your primary care physician within 1 week. Please wear your insulin pump and check your sugars four times per day.  Do not hesitate to return to the ED or call 911 if you experience: -Worsening symptoms -Nausea/vomiting so severe you cannot eat/drink anything -Abdominal pain -Lightheadedness, passing out -Fevers/chills -Anything else that concerns you

## 2023-03-23 NOTE — ED Provider Notes (Signed)
[place] EMERGENCY DEPARTMENT AT [facility] Provider Note   CSN: [id number] Arrival date & time: 03/23/23  4098     History  Chief Complaint  Patient presents with   Emesis    Lacey Bradshaw is a 52 y.o. female with T2DM, HTN, migraines, obesity, HLD, GERD,  presents with emesis.   Patient reports acute onset chills, nausea and vomiting that began at midnight last night.  She states that the first time she vomited it was food which she had eaten very earlier on in the evening which she thought was odd. She states she vomited every hour and it was yellow in color.  She states that since 530 she has begun to feel little bit better after taking a lot of Rolaids.  She has a history of chronic constipation since she was a child managed poorly at home stating that the over-the-counter medications do not work for her.  She has not had a normal bowel movement in over a week and had a very small very hard stool this morning which is unusual.  She denies any abdominal pain or bloating, dysuria/hematuria, vaginal symptoms, hematochezia/melena, hematemesis, chest pain or shortness of breath, fevers.  No history of abdominal surgery.  Does not drink alcohol.   Emesis      Home Medications Prior to Admission medications   Medication Sig Start Date End Date Taking? Authorizing Provider  docusate sodium (COLACE) 100 MG capsule Take 1 capsule (100 mg total) by mouth every 12 (twelve) hours. 03/23/23  Yes Loetta Rough, MD  ondansetron (ZOFRAN-ODT) 4 MG disintegrating tablet Take 1 tablet (4 mg total) by mouth every 8 (eight) hours as needed for nausea or vomiting. 03/23/23  Yes Loetta Rough, MD  polyethylene glycol (MIRALAX) 17 g packet Take 17 g by mouth daily. 03/23/23  Yes Loetta Rough, MD  ALPRAZolam Prudy Feeler) 1 MG tablet Take 1 mg by mouth 2 (two) times daily as needed. anxiety 08/25/15   [provider]  alprazolam Prudy Feeler) 2 MG tablet Take 2 mg by mouth at bedtime.     [provider]  atorvastatin (LIPITOR) 40 MG tablet Take 40 mg by mouth daily.    [provider]  BD VEO INSULIN SYRINGE U/F 31G X 15/64" 0.3 ML MISC ONE SYRINGE PER INSULIN USE 12/18/20   [provider]  celecoxib (CELEBREX) 200 MG capsule Take 200 mg by mouth 2 (two) times daily. 10/22/22   [provider]  cetirizine (ZYRTEC) 10 MG tablet Take 10 mg by mouth as needed. 02/20/20   [provider]  Continuous Blood Gluc Transmit (DEXCOM G6 TRANSMITTER) MISC Change transmitter every 90 days as directed. 08/26/22   Dani Gobble, NP  Continuous Glucose Sensor (DEXCOM G6 SENSOR) MISC Change sensor every 10 days as directed 02/22/23   Dani Gobble, NP  DULoxetine (CYMBALTA) 20 MG capsule Take 1 tablet by mouth daily. Patient not taking: Reported on 10/07/2022    [provider]  gabapentin (NEURONTIN) 300 MG capsule Take 300 mg by mouth as needed. 08/25/22   [provider]  hydrALAZINE (APRESOLINE) 25 MG tablet Take by mouth daily. 06/18/22   [provider]  HYDROcodone-acetaminophen (NORCO) 10-325 MG tablet Take 1 tablet by mouth in the morning and at bedtime. 10/27/22   [provider]  Insulin Disposable Pump (OMNIPOD DASH PODS, GEN 4,) MISC Change pod every 24-48 hours as directed 01/07/23   Dani Gobble, NP  NOVOLOG 100 UNIT/ML injection  Inject 10-16 Units into the skin with breakfast, with lunch, and with evening meal. 01/22/20   [provider]  nystatin cream (MYCOSTATIN) Apply 1 application  topically as needed. 02/17/20   [provider]  pantoprazole (PROTONIX) 40 MG tablet Take 40 mg by mouth 2 (two) times daily. 02/02/20   [provider]  pregabalin (LYRICA) 100 MG capsule Take 100 mg by mouth 3 (three) times daily. 02/05/20   [provider]  triamcinolone cream (KENALOG) 0.1 % Apply 1 Application topically 2 (two) times daily. 08/15/21   [provider]  valACYclovir (VALTREX) 1000 MG tablet Take 1,000 mg by mouth daily.    [provider]  VENTOLIN HFA 108 (90 Base) MCG/ACT inhaler Inhale 1-2 puffs into the lungs every 4 (four) hours as needed for wheezing or shortness of breath. 07/24/22   [provider]  vortioxetine HBr (TRINTELLIX) 5 MG TABS tablet Take 5 mg by mouth daily. 09/29/22   [provider]      Allergies    Dapagliflozin, Dulaglutide, and Liraglutide    Review of Systems   Review of Systems  Gastrointestinal:  Positive for vomiting.   Review of systems Negative for abdominal pain.  A 10 point review of systems was performed and is negative unless otherwise reported in HPI.  Physical Exam Updated Vital Signs BP 105/64   Pulse 85   Temp 98.4 F (36.9 C) (Oral)   Resp 18   Ht 5\' 3"  (1.6 m)   Wt 89.8 kg   SpO2 96%   BMI 35.07 kg/m  Physical Exam General: Normal appearing female, lying in bed.  HEENT: Sclera anicteric, dry mucous membranes, trachea midline.  Cardiology: RRR, no murmurs/rubs/gallops.  Resp: Normal respiratory rate and effort. CTAB, no wheezes, rhonchi, crackles.  Abd: Soft, non-tender, non-distended. No rebound tenderness or guarding.  GU: Deferred. MSK: No peripheral edema or signs of trauma.  Skin: warm, dry. Neuro: A&Ox4, CNs II-XII grossly intact. MAEs. Sensation grossly intact.  Psych: Normal mood and affect.   ED Results / Procedures / Treatments   Labs (all labs ordered are listed, but only abnormal results are displayed) Labs Reviewed  CBC WITH DIFFERENTIAL/PLATELET - Abnormal; Notable for the following components:      Result Value   Neutro Abs 8.3 (*)    All other components within normal limits  COMPREHENSIVE METABOLIC PANEL - Abnormal; Notable for the following components:   Glucose, Bld 417 (*)    Calcium 8.8 (*)    All other components within normal limits  URINALYSIS, ROUTINE W REFLEX MICROSCOPIC - Abnormal; Notable for the following  components:   Color, Urine STRAW (*)    Glucose, UA >=500 (*)    Ketones, ur 20 (*)    All other components within normal limits  CBG MONITORING, ED - Abnormal; Notable for the following components:   Glucose-Capillary 341 (*)    All other components within normal limits  CBG MONITORING, ED - Abnormal; Notable for the following components:   Glucose-Capillary 349 (*)    All other components within normal limits  LIPASE, BLOOD    EKG EKG Interpretation  Date/Time:  Tuesday Mar 23 2023 07:20:12 EDT Ventricular Rate:  94 PR Interval:  162 QRS Duration: 80 QT Interval:  366 QTC Calculation: 458 R Axis:   34 Text Interpretation: Sinus rhythm Confirmed by Vivi Barrack [phone]) on 03/23/2023 8:10:17 AM  Radiology No results found.  Procedures Procedures    Medications Ordered in ED Medications  metoCLOPramide (REGLAN) injection 10 mg (10 mg Intravenous Given 03/23/23 0803)  lactated ringers bolus 1,000 mL (0 mLs Intravenous Stopped 03/23/23 1042)  insulin aspart (novoLOG) injection 10 Units (10 Units Subcutaneous Given 03/23/23 1610)    ED Course/ Medical Decision Making/ A&P                          Medical Decision Making Amount and/or Complexity of Data Reviewed Labs: ordered. Decision-making details documented in ED Course.  Risk OTC drugs. Prescription drug management.    This patient presents to the ED for concern of nausea/vomiting, this involves an extensive number of treatment options, and is a complaint that carries with it a high risk of complications and morbidity.  I considered the following differential and admission for this acute, potentially life threatening condition.   MDM:    DDX for this patient's nausea/vomiting includes but is not limited to:  Consider acute life-threatening diagnoses including ACS, DKA, Patient has no abdominal pain or TTP to suggest mesenteric ischemia, ruptured viscus, or intra-abdominal infections including appendicitis,  biliary disease such as cholecystitis or cholelithiasis, diverticulitis. No urinary sxs to suggest cystitis/pyelonephritis. Consider small bowel obstruction or ileus, pancreatitis, early pregnancy or hyperemesis gravidarum, gastritis/GERD/PUD, nephrolithiasis/ureterolithiasis, gastroparesis, gastroenteritis, testicular/ovarian torsion.  Consider electrolyte abnormalities or renal injury due to dehydration/hypovolemia.   Clinical Course as of 04/04/23 2343  Tue Mar 23, 2023  0717 Glucose-Capillary(!): 341 [HN]  0802 Lipase: 51 [HN]  0802 WBC: 9.8 No leukocytosis  [HN]  0802 Glucose(!): 417 Patient relays that she accidentally knocked off her insulin pump while running to the bathroom to vomit. Will give 10U novalog SQ. Patient states her sugar normally runs in 200s. [HN]  0809 Anion gap: 10 Bicarb wnl and no anion gap, low c/f DKA/HHS. [HN]  [id number] Patient had a bowel movement and feels much improved after fluids, Reglan. [HN]  1005 Glucose-Capillary(!): 349 Decreased from 417 [HN]    Clinical Course User Index [HN] Loetta Rough, MD    Labs: I Ordered, and personally interpreted labs.  The pertinent results include:  those listed above  Additional history obtained from chart review.  Cardiac Monitoring: The patient was maintained on a cardiac monitor.  I personally viewed and interpreted the cardiac monitored which showed an underlying rhythm of: NSR  Reevaluation: After the interventions noted above, I reevaluated the patient and found that they have :improved  Social Determinants of Health: Patient lives independently   Disposition:  Workup reassuring. Patient feels significantly improved after antiemetics and having a bowel movement. Given new bowel regimen including miralax and colace, instructed to stay well hydrated.  Patient reports understanding.  Instructed to follow-up with her PCP.  Also encouraged to wear her insulin pump and check her sugars at home 4 times a day.   Given discharge instructions return precautions, all questions answered to patient satisfaction.  Co morbidities that complicate the patient evaluation  Past Medical History:  Diagnosis Date   Acid reflux    Bulging of lumbar intervertebral disc    Diabetes mellitus without complication (HCC)    borderline   Diabetes mellitus, type II (HCC)    Hyperlipidemia    Hyposomnia, insomnia or sleeplessness associated with anxiety    Migraine      Medicines Meds ordered this encounter  Medications   metoCLOPramide (REGLAN) injection 10 mg   lactated ringers bolus 1,000 mL   insulin aspart (novoLOG) injection 10 Units   ondansetron (ZOFRAN-ODT) 4  MG disintegrating tablet    Sig: Take 1 tablet (4 mg total) by mouth every 8 (eight) hours as needed for nausea or vomiting.    Dispense:  20 tablet    Refill:  0   polyethylene glycol (MIRALAX) 17 g packet    Sig: Take 17 g by mouth daily.    Dispense:  14 each    Refill:  0   docusate sodium (COLACE) 100 MG capsule    Sig: Take 1 capsule (100 mg total) by mouth every 12 (twelve) hours.    Dispense:  60 capsule    Refill:  0    I have reviewed the patients home medicines and have made adjustments as needed  Problem List / ED Course: Problem List Items Addressed This Visit   None Visit Diagnoses     Nausea and vomiting, unspecified vomiting type    -  Primary   Constipation, unspecified constipation type       Hyperglycemia                       This note was created using dictation software, which may contain spelling or grammatical errors.    Loetta Rough, MD 04/04/23 [phone]

## 2023-03-23 NOTE — ED Triage Notes (Signed)
Pt comes in with complaints of vomiting multiple times since midnight last night. Pt states it is yellow in color. Pt states hx of constipation and hasn't had a normal BM in awhile and a small hard stool this morning. Pt denies pain at this time.

## 2023-03-23 NOTE — Inpatient Diabetes Management (Addendum)
Inpatient Diabetes Program Recommendations  AACE/ADA: New Consensus Statement on Inpatient Glycemic Control (2015)  Target Ranges:  Prepandial:   less than 140 mg/dL      Peak postprandial:   less than 180 mg/dL (1-2 hours)      Critically ill patients:  140 - 180 mg/dL   Lab Results  Component Value Date   GLUCAP 341 (H) 03/23/2023   HGBA1C 9.5 (A) 01/07/2023    Diabetes history: DM2 Outpatient Diabetes medications: Omnipod insulin pump ( 1.5 units/hr.= 36 units basal) Current orders for Inpatient glycemic control: None yet  Inpatient Diabetes Program Recommendations:    Patient is currently in the ED. Patient follows NP Ronny Bacon for endocrinology with last office visit 01/07/23,  "I did change her Omnipod DASH settings to be more aggressive.  I changed her basal rate to 1.5 units per hour, and changed her insulin active time from 4 hours to 3 hours."  Patient does not have her insulin pump with her but has all supplies and insulin @ home. If not, please consider if admitted: -Semglee 18 units qd -Novolog 0-9 units q 4 hrs. If NPO Novolog 0-9 units tid, 0-5 units hs if eating  Spoke with patient via phone (DM coordinator @ [facility]). Reviewed normal ranges of CBGs and A1c with patient and followup discussion from Whitney Reardon's office visit notes. Patient plans to keep appt with Norm Salt regarding diabetes education and to see Ronny Bacon regarding trends of CBGs. Discussed basic nutrition with patient and patient acknowledged understanding.  Thank you, Billy Fischer. Garth Diffley, RN, MSN, CDE  Diabetes Coordinator Inpatient Glycemic Control Team Team Pager [phone] (8am-5pm) 03/23/2023 9:37 AM   Thank you, Billy Fischer. Chez Bulnes, RN, MSN, CDE  Diabetes Coordinator Inpatient Glycemic Control Team Team Pager [phone] (8am-5pm) 03/23/2023 9:19 AM

## 2023-04-01 ENCOUNTER — Ambulatory Visit (HOSPITAL_COMMUNITY)
Admission: RE | Admit: 2023-04-01 | Discharge: 2023-04-01 | Disposition: A | Discharge status: Home / Self Care | Payer: Medicaid Other | Source: Ambulatory Visit | Attending: Internal Medicine | Admitting: Internal Medicine

## 2023-04-01 DIAGNOSIS — N6310 Unspecified lump in the right breast, unspecified quadrant: Secondary | ICD-10-CM

## 2023-04-01 DIAGNOSIS — N6313 Unspecified lump in the right breast, lower outer quadrant: Secondary | ICD-10-CM | POA: Insufficient documentation

## 2023-04-06 ENCOUNTER — Ambulatory Visit: Payer: Medicaid Other | Attending: Internal Medicine | Admitting: Internal Medicine

## 2023-04-06 ENCOUNTER — Encounter: Payer: Self-pay | Admitting: Internal Medicine

## 2023-04-06 VITALS — BP 115/77 | HR 76 | Resp 14 | Ht 63.0 in | Wt 207.0 lb

## 2023-04-06 DIAGNOSIS — G894 Chronic pain syndrome: Secondary | ICD-10-CM | POA: Diagnosis present

## 2023-04-06 DIAGNOSIS — M25552 Pain in left hip: Secondary | ICD-10-CM | POA: Insufficient documentation

## 2023-04-06 DIAGNOSIS — G8929 Other chronic pain: Secondary | ICD-10-CM | POA: Insufficient documentation

## 2023-04-06 DIAGNOSIS — M170 Bilateral primary osteoarthritis of knee: Secondary | ICD-10-CM | POA: Insufficient documentation

## 2023-04-06 DIAGNOSIS — M4316 Spondylolisthesis, lumbar region: Secondary | ICD-10-CM | POA: Diagnosis present

## 2023-04-06 DIAGNOSIS — M545 Low back pain, unspecified: Secondary | ICD-10-CM | POA: Insufficient documentation

## 2023-04-06 MED ORDER — CYCLOBENZAPRINE HCL 5 MG PO TABS
5.0000 mg | ORAL_TABLET | Freq: Every day | ORAL | 2 refills | Status: AC
Start: 2023-04-06 — End: ?

## 2023-04-06 MED ORDER — DICLOFENAC SODIUM 2 % EX SOLN
2.0000 | Freq: Two times a day (BID) | CUTANEOUS | 2 refills | Status: DC | PRN
Start: 2023-04-06 — End: 2023-07-20

## 2023-04-06 NOTE — Progress Notes (Signed)
Office Visit Note  Patient: Lacey Bradshaw             Date of Birth: 05-28-1971           MRN: [id number]             PCP: Benita Stabile, MD Referring: Benita Stabile, MD Visit Date: 04/06/2023   Subjective:  Follow-up (Patient states she has to spray icy hot on the bottom of her feet. Patient states shoulders, elbows, left hip, legs, and feet hurt, mainly on the left side. )   History of Present Illness: Lacey Bradshaw is a 52 y.o. female here for follow up for osteoarthritis and chronic muscular pain in multiple areas mostly worse throughout her left side.  She currently on several medications takes Celebrex 200 mg twice daily, Cymbalta 20 mg daily, Lyrica 100 mg 3 times daily these are partially helpful.  She has been spraying IcyHot spray over affected areas particularly on her feet to alleviate joint pain as this is the most effective additional treatment.  Did not see any benefit when trying the topical Voltaren.  Shoulder pain is bothersome but she did see a good improvement of the right-sided pain after steroid injection in February.  Currently has more trouble coming from the left hip but she experienced a lot of pain especially difficulty getting to a comfortable position for sleep.  Previous HPI 01/04/23 Lacey Bradshaw is a 52 y.o. female here for follow up for ongoing joint pain in multiple areas biggest problems right now in the neck, upper back, and right shoulder.  Left shoulder pain is doing well currently has been improved after steroid injection at our visit in November.  She continues taking Celebrex as well as gabapentin Lyrica and Cymbalta for chronic pain these are partially helpful.  Currently rates right shoulder pain as 8 out of 10 but also having bilateral hand pain this is particularly bad in the mornings gets a little bit of improvement while up and moving.  She also feels the hand pain is worse whenever in cold temperatures.   Previous HPI 10/07/22 Lacey Bradshaw  is a 52 y.o. female here for follow up for multiple joint and muscle pains with a low positive rheumatoid factor.  Labs checked at initial evaluation were now negative for the rheumatoid factor test.  ANA checked was very low at 1: 40 homogenous and serum inflammatory markers were normal.  Symptoms remain about the same with fairly generalized joint pains.   Previous HPI 09/10/22 Lacey Bradshaw is a 52 y.o. female here for evaluation of positive rheumatoid factor associated with joint and muscle pains in multiple areas. Notices pain worse in the past year. Seems to vary in locations with some getting worse at different times including shoulders, wrists, fingers, hips, and back. No problem in her knees and feet. She sometimes sees swelling in affected areas but often just pain and stiffness. Stiffness for 1-2 hours in the morning and a few minutes whenever getting up and moving after stationary position. She has seen spine specialist for chronic lumbar pain. One steroid injection was delayed due to severe hyperglycemia at the time. She has left hip pain treated with local steroid injection in orthopedic surgery clinic this helped for a few weeks. Takes tylenol and aleve as needed usually just 1-2 times per day. She is prescribed celebrex 200 mg daily as well, in addition to gabapentin, lyrica, and cymbalta for chronic pain especially in the neck and back. Previously took  low dose hydrocodone but not on any opioid pain medication currently.   Labs reviewed RF 17.5 CCP neg ESR 2 CRP <1   Review of Systems  Constitutional:  Positive for fatigue.  HENT:  Positive for mouth dryness. Negative for mouth sores.   Eyes:  Negative for dryness.  Respiratory:  Negative for shortness of breath.   Cardiovascular:  Negative for chest pain and palpitations.  Gastrointestinal:  Positive for constipation and diarrhea. Negative for blood in stool.  Endocrine: Negative for increased urination.  Genitourinary:   Negative for involuntary urination.  Musculoskeletal:  Positive for joint pain, joint pain, myalgias, muscle weakness, morning stiffness, muscle tenderness and myalgias. Negative for gait problem and joint swelling.  Skin:  Negative for color change, rash, hair loss and sensitivity to sunlight.  Allergic/Immunologic: Positive for susceptible to infections.  Neurological:  Positive for dizziness. Negative for headaches.  Hematological:  Negative for swollen glands.  Psychiatric/Behavioral:  Positive for depressed mood and sleep disturbance. The patient is nervous/anxious.     PMFS History:  Patient Active Problem List   Diagnosis Date Noted   Pain in left hip 04/06/2023   Bilateral primary osteoarthritis of knee 04/06/2023   Bilateral shoulder pain 09/10/2022   Bilateral hand pain 09/10/2022   Abnormal renal function 06/17/2022   Cervical radiculopathy 06/16/2022   Ulnar neuropathy of right upper extremity 06/15/2022   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   Major depressive disorder 03/09/2022   Chronic pain syndrome 11/20/2021   Neuropathy 08/15/2021   Abnormal vision 08/15/2021   Chronic insomnia 08/15/2021   Contact dermatitis 08/15/2021   Dizziness 08/15/2021   Syncope 08/15/2021   Cataracts, bilateral 06/28/2021   Type 2 diabetes mellitus (HCC) 03/21/2021   Gastroesophageal reflux disease without esophagitis 03/21/2021   Migraine 03/21/2021   Morbid obesity (HCC) 03/21/2021   Mixed hyperlipidemia 03/21/2021   Chronic bilateral low back pain 10/17/2018   Spondylolisthesis, lumbar region 07/04/2018   Essential (primary) hypertension 07/04/2018   Carpal tunnel syndrome 06/01/2013    Past Medical History:  Diagnosis Date   Acid reflux    Bulging of lumbar intervertebral disc    Diabetes mellitus without complication (HCC)    borderline   Diabetes mellitus, type II (HCC)    Gastroparesis    Hyperlipidemia    Hyposomnia, insomnia or sleeplessness associated with anxiety     Migraine     Family History  Problem Relation Age of Onset   Cancer Mother    Congestive Heart Failure Father    Hypertension Father    High Cholesterol Father    Stroke Father    Heart attack Father    Diabetes Sister    COPD Sister    Congestive Heart Failure Sister    Arthritis Sister    Diabetes Sister    Diabetes Sister    Diabetes Sister    Diabetes Sister    Diabetes Sister    Diabetes Sister    Hypertension Brother    Diabetes Brother    Healthy Daughter    Past Surgical History:  Procedure Laterality Date   ABDOMINAL HYSTERECTOMY     2009   CARPAL TUNNEL RELEASE Left    CATARACT EXTRACTION Bilateral 03/2022   FRACTURE SURGERY     jaw   TUBAL LIGATION     Social History   Social History Narrative   Not on file   Immunization History  Administered Date(s) Administered   Zoster Recombinat (Shingrix) 03/10/2022     Objective: Vital Signs: BP  115/77 (BP Location: Left Arm, Patient Position: Sitting, Cuff Size: Normal)   Pulse 76   Resp 14   Ht 5\' 3"  (1.6 m)   Wt 207 lb (93.9 kg)   BMI 36.67 kg/m    Physical Exam Constitutional:      Appearance: She is obese.  Cardiovascular:     Rate and Rhythm: Normal rate and regular rhythm.  Pulmonary:     Effort: Pulmonary effort is normal.     Breath sounds: Normal breath sounds.  Skin:    General: Skin is warm and dry.     Findings: No rash.  Neurological:     Mental Status: She is alert.  Psychiatric:        Mood and Affect: Mood normal.      Musculoskeletal Exam:  Tenderness to pressure across paraspinal muscles at base of the neck and into upper back, no significant radiation of pain Elbows full ROM no tenderness or swelling Wrists full ROM no tenderness or swelling Fingers full ROM no tenderness or swelling Left lateral hip tenderness to pressure, lateral pain also provoked with FADIR movement Knees full ROM no tenderness or swelling   Investigation: No additional findings.  Imaging: MM  3D DIAGNOSTIC MAMMOGRAM BILATERAL BREAST  Result Date: 04/01/2023 CLINICAL DATA:  52 year old female with a area of concern involving the skin of the right breast. The patient thinks this may have been related to a bug bite which has since decreased in size. The patient has a small approximate 1 cm size round scab on the skin of the lower right breast. EXAM: DIGITAL DIAGNOSTIC BILATERAL MAMMOGRAM WITH TOMOSYNTHESIS TECHNIQUE: Bilateral digital diagnostic mammography and breast tomosynthesis was performed. COMPARISON:  Previous exam(s). ACR Breast Density Category a: The breasts are almost entirely fatty. FINDINGS: No suspicious masses or calcifications are seen in either breast. Spot compression tomograms were performed in the region of concern involving the skin of the slightly lower right breast with no abnormality seen. There is no mammographic evidence of malignancy in either breast. IMPRESSION: No mammographic evidence of malignancy in either breast. RECOMMENDATION: Screening mammogram in one year.(Code:[id number]) I have discussed the findings and recommendations with the patient. If applicable, a reminder letter will be sent to the patient regarding the next appointment. BI-RADS CATEGORY  1: Negative. Electronically Signed   By: Edwin Cap M.D.   On: 04/01/2023 10:28    Recent Labs: Lab Results  Component Value Date   WBC 9.8 03/23/2023   HGB 13.4 03/23/2023   PLT 183 03/23/2023   NA 135 03/23/2023   K 4.3 03/23/2023   CL 101 03/23/2023   CO2 24 03/23/2023   GLUCOSE 417 (H) 03/23/2023   BUN 14 03/23/2023   CREATININE 1.00 03/23/2023   BILITOT 1.0 03/23/2023   ALKPHOS 90 03/23/2023   AST 18 03/23/2023   ALT 19 03/23/2023   PROT 6.9 03/23/2023   ALBUMIN 3.9 03/23/2023   CALCIUM 8.8 (L) 03/23/2023   GFRAA 82 12/13/2020    Speciality Comments: No specialty comments available.  Procedures:  No procedures performed Allergies: Dapagliflozin, Dulaglutide, and Liraglutide    Assessment / Plan:     Visit Diagnoses: Pain in left hip - Plan: cyclobenzaprine (FLEXERIL) 5 MG tablet  Hip pain currently worst in lateral distribution looks more consistent for trochanteric bursitis or gluteal tendinopathy not intra-articular pathology.  Recommended some range of motion exercises for rehab also addition of low-dose Flexeril 5 mg daily at night as needed. Discussed seeing physical therapy for this  as well she prefers local site.  Chronic pain syndrome  Widespread chronic pain with fibromyalgia syndrome on lyrica and cymbalta. May also be associated with her generalized osteoarthritis, degenerative joint disease in the back, and deconditioning.  Positive rheumatoid factor but no other serum markers or peripheral joint findings of inflammation.  Chronic bilateral low back pain, unspecified whether sciatica present Spondylolisthesis, lumbar region - Plan: cyclobenzaprine (FLEXERIL) 5 MG tablet  Back pain chronic issue may also be contributing to problems with the hip or other joint compensation.  Has buffalo hump and anterior neck position probably contributing to symptoms in the neck and upper back as well.  Bilateral primary osteoarthritis of knee - Plan: diclofenac Sodium (PENNSAID) 2 % SOLN  Recommend trial of Pennsaid 2% for primary osteoarthritis of the knees due to lack of improvement with over-the-counter Voltaren.  Already on oral NSAIDs at a maximum safe dose and several pain medication.   Orders: No orders of the defined types were placed in this encounter.  Meds ordered this encounter  Medications   cyclobenzaprine (FLEXERIL) 5 MG tablet    Sig: Take 1 tablet (5 mg total) by mouth at bedtime.    Dispense:  30 tablet    Refill:  2   diclofenac Sodium (PENNSAID) 2 % SOLN    Sig: Apply 2 Pump (40 mg total) topically 2 (two) times daily as needed.    Dispense:  112 g    Refill:  2     Follow-Up Instructions: Return if symptoms worsen or fail to  improve.   Fuller Plan, MD  Note - This record has been created using [employer].  Chart creation errors have been sought, but may not always  have been located. Such creation errors do not reflect on  the standard of medical care.

## 2023-04-12 ENCOUNTER — Other Ambulatory Visit: Payer: Self-pay | Admitting: *Deleted

## 2023-04-12 ENCOUNTER — Ambulatory Visit: Payer: Medicaid Other | Admitting: Nurse Practitioner

## 2023-04-12 DIAGNOSIS — G894 Chronic pain syndrome: Secondary | ICD-10-CM

## 2023-04-12 DIAGNOSIS — M4316 Spondylolisthesis, lumbar region: Secondary | ICD-10-CM

## 2023-04-12 DIAGNOSIS — E1165 Type 2 diabetes mellitus with hyperglycemia: Secondary | ICD-10-CM

## 2023-04-12 DIAGNOSIS — I10 Essential (primary) hypertension: Secondary | ICD-10-CM

## 2023-04-12 DIAGNOSIS — Z794 Long term (current) use of insulin: Secondary | ICD-10-CM

## 2023-04-12 DIAGNOSIS — M545 Low back pain, unspecified: Secondary | ICD-10-CM

## 2023-04-12 DIAGNOSIS — M25552 Pain in left hip: Secondary | ICD-10-CM

## 2023-04-12 DIAGNOSIS — E782 Mixed hyperlipidemia: Secondary | ICD-10-CM

## 2023-04-12 NOTE — Progress Notes (Signed)
LMOM for patient with phone number to [facility] to schedule appt.

## 2023-04-12 NOTE — Progress Notes (Signed)
Referral placed.

## 2023-04-26 ENCOUNTER — Telehealth: Payer: Self-pay | Admitting: *Deleted

## 2023-04-26 NOTE — Telephone Encounter (Signed)
Did she try skin tac?  Or any skin prep that she can get from the pharmacy?  If she tried those and it didn't help, we may need to look at switching back to shots.  I know [employer] carries the skin tac wipes and other adhesive accessories to help hold the pods on.

## 2023-04-26 NOTE — Telephone Encounter (Signed)
Patient left a voicemail. She states that the Omnipods are not sticking, either they are getting knocked off or they fall off. She ask if she needs to come in sooner than her appointment next month . She also ask if she should just go back to the shots? She does not want to keep wasting the Omnipods.

## 2023-04-27 NOTE — Telephone Encounter (Signed)
Patient was called and made aware. She agrees that she will try this or something like it. If thsi does not work she will call us back.

## 2023-05-18 NOTE — Therapy (Unsigned)
OUTPATIENT PHYSICAL THERAPY LOWER EXTREMITY EVALUATION   Patient Name: Lacey Bradshaw MRN: [id number] DOB:[birth year], 52 y.o., female Today's Date: 05/18/2023  END OF SESSION:   Past Medical History:  Diagnosis Date   Acid reflux    Bulging of lumbar intervertebral disc    Diabetes mellitus without complication (HCC)    borderline   Diabetes mellitus, type II (HCC)    Gastroparesis    Hyperlipidemia    Hyposomnia, insomnia or sleeplessness associated with anxiety    Migraine    Past Surgical History:  Procedure Laterality Date   ABDOMINAL HYSTERECTOMY     2009   CARPAL TUNNEL RELEASE Left    CATARACT EXTRACTION Bilateral 03/2022   FRACTURE SURGERY     jaw   TUBAL LIGATION     Patient Active Problem List   Diagnosis Date Noted   Pain in left hip 04/06/2023   Bilateral primary osteoarthritis of knee 04/06/2023   Bilateral shoulder pain 09/10/2022   Bilateral hand pain 09/10/2022   Abnormal renal function 06/17/2022   Cervical radiculopathy 06/16/2022   Ulnar neuropathy of right upper extremity 06/15/2022   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   Major depressive disorder 03/09/2022   Chronic pain syndrome 11/20/2021   Neuropathy 08/15/2021   Abnormal vision 08/15/2021   Chronic insomnia 08/15/2021   Contact dermatitis 08/15/2021   Dizziness 08/15/2021   Syncope 08/15/2021   Cataracts, bilateral 06/28/2021   Type 2 diabetes mellitus (HCC) 03/21/2021   Gastroesophageal reflux disease without esophagitis 03/21/2021   Migraine 03/21/2021   Morbid obesity (HCC) 03/21/2021   Mixed hyperlipidemia 03/21/2021   Chronic bilateral low back pain 10/17/2018   Spondylolisthesis, lumbar region 07/04/2018   Essential (primary) hypertension 07/04/2018   Carpal tunnel syndrome 06/01/2013    PCP: Benita Stabile, MD   REFERRING PROVIDER: Fuller Plan, MD  REFERRING DIAG: [phone] (ICD-10-CM) - Pain in left hip  THERAPY DIAG:  No diagnosis found.  Rationale for  Evaluation and Treatment: Rehabilitation  ONSET DATE: chronic  SUBJECTIVE:   SUBJECTIVE STATEMENT: ***  PERTINENT HISTORY: History of Present Illness: Lacey Bradshaw is a 52 y.o. female here for follow up for osteoarthritis and chronic muscular pain in multiple areas mostly worse throughout her left side.  She currently on several medications takes Celebrex 200 mg twice daily, Cymbalta 20 mg daily, Lyrica 100 mg 3 times daily these are partially helpful.  She has been spraying IcyHot spray over affected areas particularly on her feet to alleviate joint pain as this is the most effective additional treatment.  Did not see any benefit when trying the topical Voltaren.  Shoulder pain is bothersome but she did see a good improvement of the right-sided pain after steroid injection in February.  Currently has more trouble coming from the left hip but she experienced a lot of pain especially difficulty getting to a comfortable position for sleep. PAIN:  Are you having pain? {OPRCPAIN:27236}  PRECAUTIONS: None  WEIGHT BEARING RESTRICTIONS: No  FALLS:  Has patient fallen in last 6 months? No  OCCUPATION: ***  PLOF: Independent  PATIENT GOALS: To manage my L hip pain  NEXT MD VISIT: ***  OBJECTIVE:   DIAGNOSTIC FINDINGS: none  PATIENT SURVEYS:  FOTO ***  MUSCLE LENGTH: Hamstrings: Right *** deg; Left *** deg Thomas test: Right *** deg; Left *** deg  POSTURE: {posture:25561}  PALPATION: ***  LOWER EXTREMITY ROM:  {AROM/PROM:27142} ROM Right eval Left eval  Hip flexion    Hip extension    Hip abduction  Hip adduction    Hip internal rotation    Hip external rotation    Knee flexion    Knee extension    Ankle dorsiflexion    Ankle plantarflexion    Ankle inversion    Ankle eversion     (Blank rows = not tested)  LOWER EXTREMITY MMT:  MMT Right eval Left eval  Hip flexion    Hip extension    Hip abduction    Hip adduction    Hip internal rotation    Hip  external rotation    Knee flexion    Knee extension    Ankle dorsiflexion    Ankle plantarflexion    Ankle inversion    Ankle eversion     (Blank rows = not tested)  LOWER EXTREMITY SPECIAL TESTS:  {LEspecialtests:26242}  FUNCTIONAL TESTS:  {Functional tests:24029}  GAIT: Distance walked: 57ft x2 Assistive device utilized: {Assistive devices:23999} Level of assistance: {Levels of assistance:24026} Comments: ***   TODAY'S TREATMENT:                                                                                                                              DATE: ***    PATIENT EDUCATION:  Education details: Discussed eval findings, rehab rationale and POC and patient is in agreement  Person educated: Patient Education method: Explanation Education comprehension: verbalized understanding and needs further education  HOME EXERCISE PROGRAM: ***  ASSESSMENT:  CLINICAL IMPRESSION: Patient is a *** y.o. *** who was seen today for physical therapy evaluation and treatment for ***.   OBJECTIVE IMPAIRMENTS: {opptimpairments:25111}.   ACTIVITY LIMITATIONS: {activitylimitations:27494}  PARTICIPATION LIMITATIONS: {participationrestrictions:25113}  PERSONAL FACTORS: {Personal factors:25162} are also affecting patient's functional outcome.   REHAB POTENTIAL: Good  CLINICAL DECISION MAKING: Stable/uncomplicated  EVALUATION COMPLEXITY: Low   GOALS: Goals reviewed with patient? {yes/no:20286}  SHORT TERM GOALS: Target date: *** *** Baseline: Goal status: INITIAL  2.  *** Baseline:  Goal status: INITIAL  3.  *** Baseline:  Goal status: INITIAL  4.  *** Baseline:  Goal status: INITIAL  5.  *** Baseline:  Goal status: INITIAL  6.  *** Baseline:  Goal status: INITIAL  LONG TERM GOALS: Target date: ***  *** Baseline:  Goal status: INITIAL  2.  *** Baseline:  Goal status: INITIAL  3.  *** Baseline:  Goal status: INITIAL  4.  *** Baseline:   Goal status: INITIAL  5.  *** Baseline:  Goal status: INITIAL  6.  *** Baseline:  Goal status: INITIAL   PLAN:  PT FREQUENCY: 1-2x/week  PT DURATION: 6 weeks  PLANNED INTERVENTIONS: Therapeutic exercises, Therapeutic activity, Neuromuscular re-education, Balance training, Gait training, Patient/Family education, Self Care, Joint mobilization, Stair training, Aquatic Therapy, Dry Needling, Electrical stimulation, Cryotherapy, Moist heat, Manual therapy, and Re-evaluation  PLAN FOR NEXT SESSION: HEP review and update, manual techniques as appropriate, aerobic tasks, ROM and flexibility activities, strengthening and PREs, TPDN, gait and balance training as needed  Hildred Laser, PT 05/18/2023, 1:43 PM

## 2023-05-19 ENCOUNTER — Other Ambulatory Visit: Payer: Self-pay

## 2023-05-19 ENCOUNTER — Ambulatory Visit: Payer: MEDICAID | Attending: Internal Medicine

## 2023-05-19 DIAGNOSIS — M6281 Muscle weakness (generalized): Secondary | ICD-10-CM | POA: Insufficient documentation

## 2023-05-19 DIAGNOSIS — G5702 Lesion of sciatic nerve, left lower limb: Secondary | ICD-10-CM | POA: Insufficient documentation

## 2023-05-19 DIAGNOSIS — M25552 Pain in left hip: Secondary | ICD-10-CM | POA: Diagnosis present

## 2023-05-20 ENCOUNTER — Telehealth: Payer: Self-pay | Admitting: *Deleted

## 2023-05-20 NOTE — Telephone Encounter (Signed)
Patient left a message, she states that she stepped on a rock . The bottom of her foot is real sore, feels like a bruise. She has appointment next week on the 18th with Whitney. She was wondering if she should come and see Whitney or see her PCP?

## 2023-05-20 NOTE — Telephone Encounter (Signed)
PCP for the injury

## 2023-05-20 NOTE — Telephone Encounter (Signed)
Patient was called and made aware to follow up with PCP.

## 2023-05-25 NOTE — Therapy (Deleted)
OUTPATIENT PHYSICAL THERAPY LOWER EXTREMITY EVALUATION   Patient Name: Lacey Bradshaw MRN: [id number] DOB:Jun 17, 1971, 52 y.o., female Today's Date: 05/25/2023  END OF SESSION:    Past Medical History:  Diagnosis Date   Acid reflux    Bulging of lumbar intervertebral disc    Diabetes mellitus without complication (HCC)    borderline   Diabetes mellitus, type II (HCC)    Gastroparesis    Hyperlipidemia    Hyposomnia, insomnia or sleeplessness associated with anxiety    Migraine    Past Surgical History:  Procedure Laterality Date   ABDOMINAL HYSTERECTOMY     2009   CARPAL TUNNEL RELEASE Left    CATARACT EXTRACTION Bilateral 03/2022   FRACTURE SURGERY     jaw   TUBAL LIGATION     Patient Active Problem List   Diagnosis Date Noted   Pain in left hip 04/06/2023   Bilateral primary osteoarthritis of knee 04/06/2023   Bilateral shoulder pain 09/10/2022   Bilateral hand pain 09/10/2022   Abnormal renal function 06/17/2022   Cervical radiculopathy 06/16/2022   Ulnar neuropathy of right upper extremity 06/15/2022   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   Major depressive disorder 03/09/2022   Chronic pain syndrome 11/20/2021   Neuropathy 08/15/2021   Abnormal vision 08/15/2021   Chronic insomnia 08/15/2021   Contact dermatitis 08/15/2021   Dizziness 08/15/2021   Syncope 08/15/2021   Cataracts, bilateral 06/28/2021   Type 2 diabetes mellitus (HCC) 03/21/2021   Gastroesophageal reflux disease without esophagitis 03/21/2021   Migraine 03/21/2021   Morbid obesity (HCC) 03/21/2021   Mixed hyperlipidemia 03/21/2021   Chronic bilateral low back pain 10/17/2018   Spondylolisthesis, lumbar region 07/04/2018   Essential (primary) hypertension 07/04/2018   Carpal tunnel syndrome 06/01/2013    PCP: Benita Stabile, MD   REFERRING PROVIDER: Fuller Plan, MD  REFERRING DIAG: [phone] (ICD-10-CM) - Pain in left hip  THERAPY DIAG:  No diagnosis found.  Rationale for  Evaluation and Treatment: Rehabilitation  ONSET DATE: chronic  SUBJECTIVE:   SUBJECTIVE STATEMENT: Describes on/off L hip pain worse when lying on L side and with prolonged walking.  Notes periodic Lateral LLE symptoms when piriformis pain present   PERTINENT HISTORY: History of Present Illness: Lacey Bradshaw is a 52 y.o. female here for follow up for osteoarthritis and chronic muscular pain in multiple areas mostly worse throughout her left side.  She currently on several medications takes Celebrex 200 mg twice daily, Cymbalta 20 mg daily, Lyrica 100 mg 3 times daily these are partially helpful.  She has been spraying IcyHot spray over affected areas particularly on her feet to alleviate joint pain as this is the most effective additional treatment.  Did not see any benefit when trying the topical Voltaren.  Shoulder pain is bothersome but she did see a good improvement of the right-sided pain after steroid injection in February.  Currently has more trouble coming from the left hip but she experienced a lot of pain especially difficulty getting to a comfortable position for sleep. PAIN:  Are you having pain? Yes: NPRS scale: 10/10 Pain location: L hip Pain description: ache Aggravating factors: walking, lying L side Relieving factors: ice   PRECAUTIONS: None  WEIGHT BEARING RESTRICTIONS: No  FALLS:  Has patient fallen in last 6 months? No  OCCUPATION: not workinh  PLOF: Independent  PATIENT GOALS: To manage my L hip pain  NEXT MD VISIT: PRN  OBJECTIVE:   DIAGNOSTIC FINDINGS: none  PATIENT SURVEYS:  FOTO 31(45 predicted)  MUSCLE LENGTH: Hamstrings: Right 70 deg; Left 60 deg Thomas test: Unremarkable  POSTURE:  ER at LEs  PALPATION: TTP L piriformis   LOWER EXTREMITY ROM:  Active ROM Right eval Left eval  Hip flexion    Hip extension    Hip abduction    Hip adduction    Hip internal rotation    Hip external rotation    Knee flexion    Knee extension     Ankle dorsiflexion    Ankle plantarflexion    Ankle inversion    Ankle eversion     (Blank rows = not tested)  LOWER EXTREMITY MMT:  MMT Right eval Left eval  Hip flexion  4  Hip extension  4  Hip abduction  4-  Hip adduction    Hip internal rotation    Hip external rotation    Knee flexion    Knee extension    Ankle dorsiflexion    Ankle plantarflexion    Ankle inversion    Ankle eversion     (Blank rows = not tested)  LOWER EXTREMITY SPECIAL TESTS:  Hip special tests: Luisa Hart (FABER) test: positive , Trendelenburg test: negative, and Piriformis test: positive   FUNCTIONAL TESTS:  5 times sit to stand: 19s arms crossed  GAIT: Distance walked: 32ft x2 Assistive device utilized: None Level of assistance: Complete Independence Comments: toe out gait pattern   TODAY'S TREATMENT:                                                                                                                              DATE: 05/19/23 Eval    PATIENT EDUCATION:  Education details: Discussed eval findings, rehab rationale and POC and patient is in agreement  Person educated: Patient Education method: Explanation Education comprehension: verbalized understanding and needs further education  HOME EXERCISE PROGRAM: Access Code: [email or web address] URL: [email or web address] Date: 05/19/2023 Prepared by: Gustavus Bryant  Exercises - Clamshell  - 1 x daily - 5 x weekly - 3 sets - 10 reps - Supine Piriformis Stretch with Foot on Ground  - 1 x daily - 5 x weekly - 1 sets - 2 reps - 30s hold  ASSESSMENT:  CLINICAL IMPRESSION: Patient is a 52 y.o. female who was seen today for physical therapy evaluation and treatment for chronic L hip pain. Patient presents with restricted hip IR, isolated gluteus medius weakness and positive FAI impingement signs.  Palpation finds TTP L piriformis.  OBJECTIVE IMPAIRMENTS: Abnormal gait, decreased activity tolerance, decreased knowledge of  condition, difficulty walking, decreased ROM, decreased strength, improper body mechanics, postural dysfunction, and pain.   ACTIVITY LIMITATIONS: carrying, lifting, bending, sitting, squatting, and stairs  PERSONAL FACTORS: Fitness, Past/current experiences, and Time since onset of injury/illness/exacerbation are also affecting patient's functional outcome.   REHAB POTENTIAL: Good  CLINICAL DECISION MAKING: Stable/uncomplicated  EVALUATION COMPLEXITY: Low   GOALS: Goals reviewed with patient? No  SHORT TERM GOALS: Target date: 06/09/2023   Patient to  demonstrate independence in HEP Baseline: [id number] Goal status: INITIAL   LONG TERM GOALS: Target date: 06/30/2023    Decrease pain to 6/10 at worst Baseline: 10/10 worst pain Goal status: INITIAL  2.  Increase LLE strength to 4+/5 Baseline:  MMT Right eval Left eval  Hip flexion  4  Hip extension  4  Hip abduction  4-   Goal status: INITIAL  3.  Increase FOTO score to 45 Baseline: 31 Goal status: INITIAL  4.  Minimize L piriformis tenderness Baseline: Moderate tenderness Goal status: INITIAL    PLAN:  PT FREQUENCY: 1-2x/week  PT DURATION: 6 weeks  PLANNED INTERVENTIONS: Therapeutic exercises, Therapeutic activity, Neuromuscular re-education, Balance training, Gait training, Patient/Family education, Self Care, Joint mobilization, Stair training, Aquatic Therapy, Dry Needling, Electrical stimulation, Cryotherapy, Moist heat, Manual therapy, and Re-evaluation  PLAN FOR NEXT SESSION: HEP review and update, manual techniques as appropriate, aerobic tasks, ROM and flexibility activities, strengthening and PREs, TPDN, gait and balance training as needed    Check all possible CPT codes: 32440 - PT Re-evaluation, 97110- Therapeutic Exercise, [phone]- Neuro Re-education, [phone] - Gait Training, [phone] - Manual Therapy, 97530 - Therapeutic Activities, and 97535 - Self Care    Check all conditions that are expected to  impact treatment: {Conditions expected to impact treatment:None of these apply   If treatment provided at initial evaluation, no treatment charged due to lack of authorization.        Hildred Laser, PT 05/25/2023, 2:26 PM

## 2023-05-26 NOTE — Therapy (Signed)
OUTPATIENT PHYSICAL THERAPY TREATMENT NOTE   Patient Name: Lacey Bradshaw MRN: [id number] DOB:[birth year], 52 y.o., female Today's Date: 05/27/2023  END OF SESSION:  PT End of Session - 05/27/23 1450     Visit Number 2    Number of Visits 6    Date for PT Re-Evaluation 07/20/23    Authorization Type [employer]    PT Start Time 1450    PT Stop Time 1530    PT Time Calculation (min) 40 min    Activity Tolerance Patient tolerated treatment well    Behavior During Therapy WFL for tasks assessed/performed              Past Medical History:  Diagnosis Date   Acid reflux    Bulging of lumbar intervertebral disc    Diabetes mellitus without complication (HCC)    borderline   Diabetes mellitus, type II (HCC)    Gastroparesis    Hyperlipidemia    Hyposomnia, insomnia or sleeplessness associated with anxiety    Migraine    Past Surgical History:  Procedure Laterality Date   ABDOMINAL HYSTERECTOMY     2009   CARPAL TUNNEL RELEASE Left    CATARACT EXTRACTION Bilateral 03/2022   FRACTURE SURGERY     jaw   TUBAL LIGATION     Patient Active Problem List   Diagnosis Date Noted   Pain in left hip 04/06/2023   Bilateral primary osteoarthritis of knee 04/06/2023   Bilateral shoulder pain 09/10/2022   Bilateral hand pain 09/10/2022   Abnormal renal function 06/17/2022   Cervical radiculopathy 06/16/2022   Ulnar neuropathy of right upper extremity 06/15/2022   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   Major depressive disorder 03/09/2022   Chronic pain syndrome 11/20/2021   Neuropathy 08/15/2021   Abnormal vision 08/15/2021   Chronic insomnia 08/15/2021   Contact dermatitis 08/15/2021   Dizziness 08/15/2021   Syncope 08/15/2021   Cataracts, bilateral 06/28/2021   Type 2 diabetes mellitus (HCC) 03/21/2021   Gastroesophageal reflux disease without esophagitis 03/21/2021   Migraine 03/21/2021   Morbid obesity (HCC) 03/21/2021   Mixed hyperlipidemia 03/21/2021   Chronic  bilateral low back pain 10/17/2018   Spondylolisthesis, lumbar region 07/04/2018   Essential (primary) hypertension 07/04/2018   Carpal tunnel syndrome 06/01/2013    PCP: Benita Stabile, MD   REFERRING PROVIDER: Fuller Plan, MD  REFERRING DIAG: [phone] (ICD-10-CM) - Pain in left hip  THERAPY DIAG:  Piriformis syndrome, left  Pain in left hip  Muscle weakness (generalized)  Rationale for Evaluation and Treatment: Rehabilitation  ONSET DATE: chronic  SUBJECTIVE:   SUBJECTIVE STATEMENT: Has been compliant with HEP but feels stretching can aggravate symptoms.     PERTINENT HISTORY: History of Present Illness: Lacey Bradshaw is a 52 y.o. female here for follow up for osteoarthritis and chronic muscular pain in multiple areas mostly worse throughout her left side.  She currently on several medications takes Celebrex 200 mg twice daily, Cymbalta 20 mg daily, Lyrica 100 mg 3 times daily these are partially helpful.  She has been spraying IcyHot spray over affected areas particularly on her feet to alleviate joint pain as this is the most effective additional treatment.  Did not see any benefit when trying the topical Voltaren.  Shoulder pain is bothersome but she did see a good improvement of the right-sided pain after steroid injection in February.  Currently has more trouble coming from the left hip but she experienced a lot of pain especially difficulty getting to a comfortable  position for sleep. PAIN:  Are you having pain? Yes: NPRS scale: 10/10 Pain location: L hip Pain description: ache Aggravating factors: walking, lying L side Relieving factors: ice   PRECAUTIONS: None  WEIGHT BEARING RESTRICTIONS: No  FALLS:  Has patient fallen in last 6 months? No  OCCUPATION: not working  PLOF: Independent  PATIENT GOALS: To manage my L hip pain  NEXT MD VISIT: PRN  OBJECTIVE:   DIAGNOSTIC FINDINGS: none  PATIENT SURVEYS:  FOTO 31(45 predicted)  MUSCLE  LENGTH: Hamstrings: Right 70 deg; Left 60 deg Thomas test: Unremarkable  POSTURE:  ER at LEs  PALPATION: TTP L piriformis   LOWER EXTREMITY ROM:  Active ROM Right eval Left eval  Hip flexion    Hip extension    Hip abduction    Hip adduction    Hip internal rotation    Hip external rotation    Knee flexion    Knee extension    Ankle dorsiflexion    Ankle plantarflexion    Ankle inversion    Ankle eversion     (Blank rows = not tested)  LOWER EXTREMITY MMT:  MMT Right eval Left eval  Hip flexion  4  Hip extension  4  Hip abduction  4-  Hip adduction    Hip internal rotation    Hip external rotation    Knee flexion    Knee extension    Ankle dorsiflexion    Ankle plantarflexion    Ankle inversion    Ankle eversion     (Blank rows = not tested)  LOWER EXTREMITY SPECIAL TESTS:  Hip special tests: Luisa Hart (FABER) test: positive , Trendelenburg test: negative, and Piriformis test: positive   FUNCTIONAL TESTS:  5 times sit to stand: 19s arms crossed  GAIT: Distance walked: 54ft x2 Assistive device utilized: None Level of assistance: Complete Independence Comments: toe out gait pattern   TODAY'S TREATMENT:            [facility] Adult PT Treatment:                                                DATE: 05/27/23 Therapeutic Exercise: Nustep L1 6 min tolerance L SKTC 30s x2 Supine hip fallouts YTB 10x B, 10/10 unilateral Supine march 10/10 PPT 3s 10x OH flexion with inspiration 10x Alt OH flexion 10/10 R S/L open book                                                                                                                   DATE: 05/19/23 Eval    PATIENT EDUCATION:  Education details: Discussed eval findings, rehab rationale and POC and patient is in agreement  Person educated: Patient Education method: Explanation Education comprehension: verbalized understanding and needs further education  HOME EXERCISE PROGRAM: Access Code: [email or web address] URL:  [email or web address] Date: 05/19/2023 Prepared by: Gustavus Bryant  Exercises -  Clamshell  - 1 x daily - 5 x weekly - 3 sets - 10 reps - Supine Piriformis Stretch with Foot on Ground  - 1 x daily - 5 x weekly - 1 sets - 2 reps - 30s hold  ASSESSMENT:  CLINICAL IMPRESSION: Focus of visit today was flexibility and stretching to promote soft tissue mobility.  Limited resistance on all tasks to minimize aggravation.  Despite stretch based treatment, symptoms persisted.  Patient is a 52 y.o. female who was seen today for physical therapy evaluation and treatment for chronic L hip pain. Patient presents with restricted hip IR, isolated gluteus medius weakness and positive FAI impingement signs.  Palpation finds TTP L piriformis.  OBJECTIVE IMPAIRMENTS: Abnormal gait, decreased activity tolerance, decreased knowledge of condition, difficulty walking, decreased ROM, decreased strength, improper body mechanics, postural dysfunction, and pain.   ACTIVITY LIMITATIONS: carrying, lifting, bending, sitting, squatting, and stairs  PERSONAL FACTORS: Fitness, Past/current experiences, and Time since onset of injury/illness/exacerbation are also affecting patient's functional outcome.   REHAB POTENTIAL: Good  CLINICAL DECISION MAKING: Stable/uncomplicated  EVALUATION COMPLEXITY: Low   GOALS: Goals reviewed with patient? No  SHORT TERM GOALS: Target date: 06/09/2023   Patient to demonstrate independence in HEP Baseline: [id number] Goal status: INITIAL   LONG TERM GOALS: Target date: 06/30/2023    Decrease pain to 6/10 at worst Baseline: 10/10 worst pain Goal status: INITIAL  2.  Increase LLE strength to 4+/5 Baseline:  MMT Right eval Left eval  Hip flexion  4  Hip extension  4  Hip abduction  4-   Goal status: INITIAL  3.  Increase FOTO score to 45 Baseline: 31 Goal status: INITIAL  4.  Minimize L piriformis tenderness Baseline: Moderate tenderness Goal status:  INITIAL    PLAN:  PT FREQUENCY: 1-2x/week  PT DURATION: 6 weeks  PLANNED INTERVENTIONS: Therapeutic exercises, Therapeutic activity, Neuromuscular re-education, Balance training, Gait training, Patient/Family education, Self Care, Joint mobilization, Stair training, Aquatic Therapy, Dry Needling, Electrical stimulation, Cryotherapy, Moist heat, Manual therapy, and Re-evaluation  PLAN FOR NEXT SESSION: HEP review and update, manual techniques as appropriate, aerobic tasks, ROM and flexibility activities, strengthening and PREs, TPDN, gait and balance training as needed    Check all possible CPT codes: 81191 - PT Re-evaluation, 97110- Therapeutic Exercise, [phone]- Neuro Re-education, [phone] - Gait Training, [phone] - Manual Therapy, 97530 - Therapeutic Activities, and 97535 - Self Care    Check all conditions that are expected to impact treatment: {Conditions expected to impact treatment:None of these apply   If treatment provided at initial evaluation, no treatment charged due to lack of authorization.        Hildred Laser, PT 05/27/2023, 3:48 PM

## 2023-05-27 ENCOUNTER — Ambulatory Visit: Payer: MEDICAID

## 2023-05-27 DIAGNOSIS — G5702 Lesion of sciatic nerve, left lower limb: Secondary | ICD-10-CM | POA: Diagnosis not present

## 2023-05-27 DIAGNOSIS — M25552 Pain in left hip: Secondary | ICD-10-CM

## 2023-05-27 DIAGNOSIS — M6281 Muscle weakness (generalized): Secondary | ICD-10-CM

## 2023-06-02 ENCOUNTER — Ambulatory Visit (INDEPENDENT_AMBULATORY_CARE_PROVIDER_SITE_OTHER): Payer: MEDICAID | Admitting: Nurse Practitioner

## 2023-06-02 ENCOUNTER — Ambulatory Visit: Payer: MEDICAID

## 2023-06-02 ENCOUNTER — Encounter: Payer: Self-pay | Admitting: Nurse Practitioner

## 2023-06-02 VITALS — BP 129/81 | HR 80 | Ht 63.0 in | Wt 208.6 lb

## 2023-06-02 DIAGNOSIS — Z794 Long term (current) use of insulin: Secondary | ICD-10-CM | POA: Diagnosis not present

## 2023-06-02 DIAGNOSIS — E1165 Type 2 diabetes mellitus with hyperglycemia: Secondary | ICD-10-CM | POA: Diagnosis not present

## 2023-06-02 DIAGNOSIS — E782 Mixed hyperlipidemia: Secondary | ICD-10-CM

## 2023-06-02 DIAGNOSIS — I10 Essential (primary) hypertension: Secondary | ICD-10-CM

## 2023-06-02 MED ORDER — NOVOLOG FLEXPEN 100 UNIT/ML ~~LOC~~ SOPN: 8.0000 [IU] | PEN_INJECTOR | Freq: Three times a day (TID) | SUBCUTANEOUS | 3 refills | Status: DC

## 2023-06-02 MED ORDER — TOUJEO MAX SOLOSTAR 300 UNIT/ML ~~LOC~~ SOPN: 30.0000 [IU] | PEN_INJECTOR | Freq: Every evening | SUBCUTANEOUS | 3 refills | Status: DC

## 2023-06-02 MED ORDER — DEXCOM G7 RECEIVER DEVI: 1.0000 | Freq: Once | 0 refills | Status: AC

## 2023-06-02 MED ORDER — DEXCOM G7 SENSOR MISC: 1.0000 | 3 refills | Status: DC

## 2023-06-02 NOTE — Progress Notes (Signed)
Endocrinology Follow Up Note       06/02/2023, 4:43 PM   Subjective:    Patient ID: Lacey Bradshaw, female    DOB: 09-07-1971.  Lacey Bradshaw is being seen in follow up after being seen in consultation for management of currently uncontrolled symptomatic diabetes requested by  Benita Stabile, MD.   Past Medical History:  Diagnosis Date   Acid reflux    Bulging of lumbar intervertebral disc    Diabetes mellitus without complication (HCC)    borderline   Diabetes mellitus, type II (HCC)    Gastroparesis    Hyperlipidemia    Hyposomnia, insomnia or sleeplessness associated with anxiety    Migraine     Past Surgical History:  Procedure Laterality Date   ABDOMINAL HYSTERECTOMY     2009   CARPAL TUNNEL RELEASE Left    CATARACT EXTRACTION Bilateral 03/2022   FRACTURE SURGERY     jaw   TUBAL LIGATION      Social History   Socioeconomic History   Marital status: Single    Spouse name: Not on file   Number of children: Not on file   Years of education: Not on file   Highest education level: Not on file  Occupational History   Not on file  Tobacco Use   Smoking status: Never    Passive exposure: Past   Smokeless tobacco: Never  Vaping Use   Vaping status: Never Used  Substance and Sexual Activity   Alcohol use: No   Drug use: No   Sexual activity: Not on file  Other Topics Concern   Not on file  Social History Narrative   Not on file   Social Determinants of Health   Financial Resource Strain: Medium Risk (06/15/2022)   Received from [facility], [facility]   Overall Financial Resource Strain (CARDIA)    Difficulty of Paying Living Expenses: Somewhat hard  Food Insecurity: Food Insecurity Present (06/15/2022)   Received from [facility], [facility]   Hunger Vital Sign    Worried About Running Out of Food in the Last Year: Never true    Ran Out of Food in the Last Year: Sometimes  true  Transportation Needs: No Transportation Needs (06/15/2022)   Received from [facility], [facility]   PRAPARE - Transportation    Lack of Transportation (Medical): No    Lack of Transportation (Non-Medical): No  Physical Activity: Insufficiently Active (06/15/2022)   Received from [facility], [facility]   Exercise Vital Sign    Days of Exercise per Week: 2 days    Minutes of Exercise per Session: 30 min  Stress: Stress Concern Present (06/15/2022)   Received from [facility], [facility] - Occupational Stress Questionnaire    Feeling of Stress : Very much  Social Connections: Somewhat Isolated (06/15/2022)   Received from [facility], [facility]   Social Network    How would you rate your social network (family, work, friends)?: Restricted participation with some degree of social isolation    Family History  Problem Relation Age of Onset   Cancer Mother    Congestive Heart Failure Father  Hypertension Father    High Cholesterol Father    Stroke Father    Heart attack Father    Diabetes Sister    COPD Sister    Congestive Heart Failure Sister    Arthritis Sister    Diabetes Sister    Diabetes Sister    Diabetes Sister    Diabetes Sister    Diabetes Sister    Diabetes Sister    Hypertension Brother    Diabetes Brother    Healthy Daughter     Outpatient Encounter Medications as of 06/02/2023  Medication Sig   ALPRAZolam (XANAX) 1 MG tablet Take 1 mg by mouth 2 (two) times daily as needed. anxiety   alprazolam (XANAX) 2 MG tablet Take 2 mg by mouth at bedtime.   atorvastatin (LIPITOR) 40 MG tablet Take 40 mg by mouth daily.   BD VEO INSULIN SYRINGE U/F 31G X 15/64" 0.3 ML MISC ONE SYRINGE PER INSULIN USE   celecoxib (CELEBREX) 200 MG capsule Take 200 mg by mouth 2 (two) times daily.   cetirizine (ZYRTEC) 10 MG tablet Take 10 mg by mouth as needed.   Continuous Glucose Receiver (DEXCOM G7 RECEIVER) DEVI 1  Device by Does not apply route once for 1 dose.   Continuous Glucose Sensor (DEXCOM G7 SENSOR) MISC Inject 1 Application into the skin as directed. Change sensor every 10 days as directed.   cyclobenzaprine (FLEXERIL) 5 MG tablet Take 1 tablet (5 mg total) by mouth at bedtime.   diclofenac Sodium (PENNSAID) 2 % SOLN Apply 2 Pump (40 mg total) topically 2 (two) times daily as needed.   docusate sodium (COLACE) 100 MG capsule Take 1 capsule (100 mg total) by mouth every 12 (twelve) hours.   DULoxetine (CYMBALTA) 20 MG capsule Take 1 tablet by mouth daily.   gabapentin (NEURONTIN) 300 MG capsule Take 300 mg by mouth as needed.   hydrALAZINE (APRESOLINE) 25 MG tablet Take by mouth daily.   HYDROcodone-acetaminophen (NORCO) 10-325 MG tablet Take 1 tablet by mouth in the morning and at bedtime.   insulin aspart (NOVOLOG FLEXPEN) 100 UNIT/ML FlexPen Inject 8-14 Units into the skin 3 (three) times daily with meals.   insulin glargine, 2 Unit Dial, (TOUJEO MAX SOLOSTAR) 300 UNIT/ML Solostar Pen Inject 30 Units into the skin at bedtime.   lidocaine-prilocaine (EMLA) cream SMARTSIG:2-3 Gram(s) Topical 4 Times Daily   linaclotide (LINZESS) 145 MCG CAPS capsule Take 1 capsule every day by oral route for 90 days.   nystatin cream (MYCOSTATIN) Apply 1 application  topically as needed.   ondansetron (ZOFRAN-ODT) 4 MG disintegrating tablet Take 1 tablet (4 mg total) by mouth every 8 (eight) hours as needed for nausea or vomiting.   pantoprazole (PROTONIX) 40 MG tablet Take 40 mg by mouth 2 (two) times daily.   polyethylene glycol (MIRALAX) 17 g packet Take 17 g by mouth daily.   pregabalin (LYRICA) 100 MG capsule Take 100 mg by mouth 3 (three) times daily.   triamcinolone cream (KENALOG) 0.1 % Apply 1 Application topically 2 (two) times daily.   valACYclovir (VALTREX) 1000 MG tablet Take 1,000 mg by mouth daily.   VENTOLIN HFA 108 (90 Base) MCG/ACT inhaler Inhale 1-2 puffs into the lungs every 4 (four) hours as  needed for wheezing or shortness of breath.   vortioxetine HBr (TRINTELLIX) 5 MG TABS tablet Take 5 mg by mouth daily.   [DISCONTINUED] Continuous Blood Gluc Transmit (DEXCOM G6 TRANSMITTER) MISC Change transmitter every 90 days as directed.   [  DISCONTINUED] Continuous Glucose Sensor (DEXCOM G6 SENSOR) MISC Change sensor every 10 days as directed   [DISCONTINUED] NOVOLOG 100 UNIT/ML injection Inject 10-16 Units into the skin with breakfast, with lunch, and with evening meal.   [DISCONTINUED] Insulin Disposable Pump (OMNIPOD DASH PODS, GEN 4,) MISC Change pod every 24-48 hours as directed (Patient not taking: Reported on 06/02/2023)   No facility-administered encounter medications on file as of 06/02/2023.    ALLERGIES: Allergies  Allergen Reactions   Dapagliflozin Other (See Comments)    Nausea and vomiting   Dulaglutide     Other reaction(s): Makes her feel terrible.   Liraglutide     Other reaction(s): burping & belching    VACCINATION STATUS: Immunization History  Administered Date(s) Administered   Zoster Recombinant(Shingrix) 03/10/2022    Diabetes She presents for her follow-up diabetic visit. She has type 2 diabetes mellitus. Onset time: diagnosed at approx age of 58. Her disease course has been worsening. There are no hypoglycemic associated symptoms. Associated symptoms include blurred vision, fatigue, foot paresthesias, polydipsia, polyphagia, polyuria and visual change. Pertinent negatives for diabetes include no weight loss. There are no hypoglycemic complications. Symptoms are stable. Diabetic complications include peripheral neuropathy and retinopathy. Risk factors for coronary artery disease include diabetes mellitus, dyslipidemia, hypertension, obesity, sedentary lifestyle and stress. Current diabetic treatment includes intensive insulin program. She is compliant with treatment most of the time. Her weight is fluctuating minimally. She is following a generally unhealthy  diet. Meal planning includes avoidance of concentrated sweets. She has had a previous visit with a dietitian. She participates in exercise three times a week. Her home blood glucose trend is increasing steadily. Her breakfast blood glucose range is generally >200 mg/dl. Her lunch blood glucose range is generally >200 mg/dl. Her dinner blood glucose range is generally >200 mg/dl. Her bedtime blood glucose range is generally >200 mg/dl. Her overall blood glucose range is >200 mg/dl. (She presents today with her meter (not using CGM currently) showing gross hyperglycemia overall.  Her most recent A1c on 03/30/23 was 11.6%, worsening from last visit of 9.5%.  She notes she is unable to keep her Omnipods on, is sweating them off and has reverted back to MDI, currently taking leftover Levemir 25 units nightly and Novolog per SSI at meals.  ) An ACE inhibitor/angiotensin II receptor blocker is being taken. She does not see a podiatrist.Eye exam is current (has appt for f/u next week).  Hyperlipidemia This is a chronic problem. The current episode started more than 1 year ago. The problem is uncontrolled. Recent lipid tests were reviewed and are high. Exacerbating diseases include chronic renal disease, diabetes and obesity. Factors aggravating her hyperlipidemia include fatty foods. Current antihyperlipidemic treatment includes statins. The current treatment provides mild improvement of lipids. Compliance problems include adherence to exercise and adherence to diet.  Risk factors for coronary artery disease include diabetes mellitus, dyslipidemia, hypertension, obesity and stress.  Hypertension This is a chronic problem. The current episode started more than 1 year ago. The problem has been resolved since onset. The problem is controlled. Associated symptoms include blurred vision. There are no associated agents to hypertension. Risk factors for coronary artery disease include diabetes mellitus, dyslipidemia, stress and  obesity. Past treatments include ACE inhibitors. The current treatment provides significant improvement. There are no compliance problems.  Hypertensive end-organ damage includes kidney disease and retinopathy. Identifiable causes of hypertension include chronic renal disease.     Review of systems  Constitutional: + stable body weight,  current  Body mass index is 36.95 kg/m. , no fatigue, no subjective hyperthermia, no subjective hypothermia Cardiovascular: no chest pain, no shortness of breath, no palpitations, no leg swelling Respiratory: no cough, no shortness of breath Gastrointestinal: no nausea/vomiting/diarrhea Musculoskeletal: no muscle/joint aches Skin: no rashes, no hyperemia, + wound to right foot (? Foreign body)- has appt with podiatry on Friday Neurological: no tremors, no numbness, no tingling, no dizziness Psychiatric: no depression, no anxiety  Objective:     BP 129/81 (BP Location: Left Arm, Patient Position: Sitting, Cuff Size: Large)   Pulse 80   Ht 5\' 3"  (1.6 m)   Wt 208 lb 9.6 oz (94.6 kg)   BMI 36.95 kg/m   Wt Readings from Last 3 Encounters:  06/02/23 208 lb 9.6 oz (94.6 kg)  04/06/23 207 lb (93.9 kg)  03/23/23 198 lb (89.8 kg)     BP Readings from Last 3 Encounters:  06/02/23 129/81  04/06/23 115/77  03/23/23 105/64      Physical Exam- Limited  Constitutional:  Body mass index is 36.95 kg/m. , not in acute distress, normal state of mind Eyes:  EOMI, no exophthalmos Musculoskeletal: no gross deformities, strength intact in all four extremities, no gross restriction of joint movements Skin:  no rashes, no hyperemia Neurological: no tremor with outstretched hands   Diabetic Foot Exam - Simple   Simple Foot Form Diabetic Foot exam was performed with the following findings: Yes 06/02/2023  2:29 PM  Visual Inspection See comments: Yes Sensation Testing Intact to touch and monofilament testing bilaterally: Yes Pulse Check Posterior Tibialis  and Dorsalis pulse intact bilaterally: Yes Comments Right foot small ?foreign body on plantar foot     CMP ( most recent) CMP     Component Value Date/Time   NA 135 03/23/2023 0728   NA 141 09/25/2022 0000   K 4.3 03/23/2023 0728   CL 101 03/23/2023 0728   CO2 24 03/23/2023 0728   GLUCOSE 417 (H) 03/23/2023 0728   BUN 14 03/23/2023 0728   BUN 11 09/25/2022 0000   CREATININE 1.00 03/23/2023 0728   CREATININE 1.01 09/10/2022 0949   CALCIUM 8.8 (L) 03/23/2023 0728   PROT 6.9 03/23/2023 0728   ALBUMIN 3.9 03/23/2023 0728   AST 18 03/23/2023 0728   ALT 19 03/23/2023 0728   ALKPHOS 90 03/23/2023 0728   BILITOT 1.0 03/23/2023 0728   GFRNONAA >60 03/23/2023 0728   GFRAA 82 12/13/2020 0000     Diabetic Labs (most recent): Lab Results  Component Value Date   HGBA1C 9.5 (A) 01/07/2023   HGBA1C 10.0 09/25/2022   HGBA1C 13.2 06/12/2022   MICROALBUR 11 12/13/2020     Lipid Panel ( most recent) Lipid Panel     Component Value Date/Time   CHOL 162 09/25/2022 0000   TRIG 94 06/12/2022 0000   HDL 51 09/25/2022 0000   LDLCALC 90 09/25/2022 0000      No results found for: "TSH", "FREET4"         Assessment & Plan:   1) Type 2 diabetes mellitus with hyperglycemia, with long-term current use of insulin (HCC)  - [place] has currently uncontrolled symptomatic type 2 DM since 52 years of age.   She presents today with her meter (not using CGM currently) showing gross hyperglycemia overall.  Her most recent A1c on 03/30/23 was 11.6%, worsening from last visit of 9.5%.  She notes she is unable to keep her Omnipods on, is sweating them off and has reverted back to  MDI, currently taking leftover Levemir 25 units nightly and Novolog per SSI at meals.   -Recent labs reviewed.  - I had a long discussion with her about the progressive nature of diabetes and the pathology behind its complications. -her diabetes is complicated by CKD, retinopathy and neuropathy and she  remains at a high risk for more acute and chronic complications which include CAD, CVA. These are all discussed in detail with her.  - Nutritional counseling repeated at each appointment due to patients tendency to fall back in to old habits.  - The patient admits there is a room for improvement in their diet and drink choices. -  Suggestion is made for the patient to avoid simple carbohydrates from their diet including Cakes, Sweet Desserts / Pastries, Ice Cream, Soda (diet and regular), Sweet Tea, Candies, Chips, Cookies, Sweet Pastries, Store Bought Juices, Alcohol in Excess of 1-2 drinks a day, Artificial Sweeteners, Coffee Creamer, and "Sugar-free" Products. This will help patient to have stable blood glucose profile and potentially avoid unintended weight gain.   - I encouraged the patient to switch to unprocessed or minimally processed complex starch and increased protein intake (animal or plant source), fruits, and vegetables.   - Patient is advised to stick to a routine mealtimes to eat 3 meals a day and avoid unnecessary snacks (to snack only to correct hypoglycemia).  - she has missed several appts with Norm Salt, RDE for diabetes education.  - I have approached her with the following individualized plan to manage  her diabetes and patient agrees:   - I encouraged her to stick to MDI for cost purposes as her Omnipods do not stick well.  I advised her to adjust her Toujeo (or leftover Levemir) to 30 units SQ nightly and adjusted her Novolog to 8-14 units TID with meals if glucose is above 90 and she is eating (Specific instructions on how to titrate insulin dosage based on glucose readings given to patient in writing).   -she is encouraged to continue monitoring glucose 4 times daily (using her CGM), before meals and before bed, and to call the clinic if she has readings less than 70 or above 300 for 3 tests in a row.  - she is warned not to take insulin without proper monitoring  per orders.  - Adjustment parameters are given to her for hypo and hyperglycemia in writing.  - she did not tolerate any incretin therapy in the past (N/V) and hx gastroparesis.  - Specific targets for  A1c;  LDL, HDL,  and Triglycerides were discussed with the patient.  2) Blood Pressure /Hypertension:  her blood pressure is controlled to target.   she is advised to continue her current medications including Lisinopril 5 mg p.o. daily with breakfast.  3) Lipids/Hyperlipidemia:    Review of her recent lipid panel from 03/30/23 showed controlled LDL at 83 .  she  is advised to continue Simvastatin 40 mg daily at bedtime.  Side effects and precautions discussed with her.  4)  Weight/Diet:  her Body mass index is 36.95 kg/m.  -  clearly complicating her diabetes care.   she is a candidate for weight loss. I discussed with her the fact that loss of 5 - 10% of her  current body weight will have the most impact on her diabetes management.  Exercise, and detailed carbohydrates information provided  -  detailed on discharge instructions.  5) Vitamin D Deficiency: Her most recent vitamin D level was 13.4 on 12/13/20.  She does not appear to be on any supplementation.  Will recheck on subsequent visits.  6) Chronic Care/Health Maintenance: -she is on ACEI/ARB and Statin medications and is encouraged to initiate and continue to follow up with Ophthalmology, Dentist, Podiatrist at least yearly or according to recommendations, and advised to stay away from smoking. I have recommended yearly flu vaccine and pneumonia vaccine at least every 5 years; moderate intensity exercise for up to 150 minutes weekly; and sleep for at least 7 hours a day.  - she is advised to maintain close follow up with Benita Stabile, MD for primary care needs, as well as her other providers for optimal and coordinated care.     I spent  40  minutes in the care of the patient today including review of labs from CMP, Lipids, Thyroid  Function, Hematology (current and previous including abstractions from other facilities); face-to-face time discussing  her blood glucose readings/logs, discussing hypoglycemia and hyperglycemia episodes and symptoms, medications doses, her options of short and long term treatment based on the latest standards of care / guidelines;  discussion about incorporating lifestyle medicine;  and documenting the encounter. Risk reduction counseling performed per USPSTF guidelines to reduce obesity and cardiovascular risk factors.     Please refer to Patient Instructions for Blood Glucose Monitoring and Insulin/Medications Dosing Guide"  in media tab for additional information. Please  also refer to " Patient Self Inventory" in the Media  tab for reviewed elements of pertinent patient history.  [name] participated in the discussions, expressed understanding, and voiced agreement with the above plans.  All questions were answered to her satisfaction. she is encouraged to contact clinic should she have any questions or concerns prior to her return visit.   Follow up plan: - Return in about 3 months (around 09/02/2023) for Diabetes F/U with A1c in office, No previsit labs, Bring meter and logs.  Ronny Bacon, [facility] [address] [place], [place] [address] Phone: [phone] Fax: [phone]  06/02/2023, 4:43 PM

## 2023-06-03 ENCOUNTER — Ambulatory Visit: Payer: MEDICAID

## 2023-06-03 ENCOUNTER — Other Ambulatory Visit (HOSPITAL_COMMUNITY): Payer: Self-pay

## 2023-06-03 ENCOUNTER — Telehealth: Payer: Self-pay | Admitting: Pharmacy Technician

## 2023-06-03 DIAGNOSIS — G5702 Lesion of sciatic nerve, left lower limb: Secondary | ICD-10-CM | POA: Diagnosis not present

## 2023-06-03 DIAGNOSIS — M25552 Pain in left hip: Secondary | ICD-10-CM

## 2023-06-03 DIAGNOSIS — M6281 Muscle weakness (generalized): Secondary | ICD-10-CM

## 2023-06-03 MED ORDER — LANTUS SOLOSTAR 100 UNIT/ML ~~LOC~~ SOPN: 30.0000 [IU] | PEN_INJECTOR | Freq: Every day | SUBCUTANEOUS | 3 refills | Status: DC

## 2023-06-03 NOTE — Telephone Encounter (Signed)
error 

## 2023-06-03 NOTE — Telephone Encounter (Signed)
Per Ronny Bacon, NP May send in the Tresiba 30 units at bedtime, this has been done.

## 2023-06-03 NOTE — Therapy (Signed)
OUTPATIENT PHYSICAL THERAPY TREATMENT NOTE   Patient Name: Lacey Bradshaw MRN: [id number] DOB:1971/08/10, 52 y.o., female Today's Date: 06/03/2023  END OF SESSION:  PT End of Session - 06/03/23 1445     Visit Number 3    Number of Visits 6    Date for PT Re-Evaluation 07/20/23    Authorization Type [employer]    PT Start Time 1445    PT Stop Time 1523    PT Time Calculation (min) 38 min    Activity Tolerance Patient tolerated treatment well    Behavior During Therapy WFL for tasks assessed/performed               Past Medical History:  Diagnosis Date   Acid reflux    Bulging of lumbar intervertebral disc    Diabetes mellitus without complication (HCC)    borderline   Diabetes mellitus, type II (HCC)    Gastroparesis    Hyperlipidemia    Hyposomnia, insomnia or sleeplessness associated with anxiety    Migraine    Past Surgical History:  Procedure Laterality Date   ABDOMINAL HYSTERECTOMY     2009   CARPAL TUNNEL RELEASE Left    CATARACT EXTRACTION Bilateral 03/2022   FRACTURE SURGERY     jaw   TUBAL LIGATION     Patient Active Problem List   Diagnosis Date Noted   Pain in left hip 04/06/2023   Bilateral primary osteoarthritis of knee 04/06/2023   Bilateral shoulder pain 09/10/2022   Bilateral hand pain 09/10/2022   Abnormal renal function 06/17/2022   Cervical radiculopathy 06/16/2022   Ulnar neuropathy of right upper extremity 06/15/2022   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   Major depressive disorder 03/09/2022   Chronic pain syndrome 11/20/2021   Neuropathy 08/15/2021   Abnormal vision 08/15/2021   Chronic insomnia 08/15/2021   Contact dermatitis 08/15/2021   Dizziness 08/15/2021   Syncope 08/15/2021   Cataracts, bilateral 06/28/2021   Type 2 diabetes mellitus (HCC) 03/21/2021   Gastroesophageal reflux disease without esophagitis 03/21/2021   Migraine 03/21/2021   Morbid obesity (HCC) 03/21/2021   Mixed hyperlipidemia 03/21/2021   Chronic  bilateral low back pain 10/17/2018   Spondylolisthesis, lumbar region 07/04/2018   Essential (primary) hypertension 07/04/2018   Carpal tunnel syndrome 06/01/2013    PCP: Benita Stabile, MD   REFERRING PROVIDER: Fuller Plan, MD  REFERRING DIAG: [phone] (ICD-10-CM) - Pain in left hip  THERAPY DIAG:  Pain in left hip  Muscle weakness (generalized)  Rationale for Evaluation and Treatment: Rehabilitation  ONSET DATE: chronic  SUBJECTIVE:   SUBJECTIVE STATEMENT: Pt presents to PT with continued L hip pain. Has been compliant with HEP with no adverse effect.    PERTINENT HISTORY: See PMH  PAIN:  Are you having pain?  Yes: NPRS scale: 7/10 Pain location: L hip Pain description: ache Aggravating factors: walking, lying L side Relieving factors: ice   PRECAUTIONS: None  WEIGHT BEARING RESTRICTIONS: No  FALLS:  Has patient fallen in last 6 months? No  OCCUPATION: not working  PLOF: Independent  PATIENT GOALS: To manage my L hip pain  NEXT MD VISIT: PRN  OBJECTIVE:   DIAGNOSTIC FINDINGS: none  PATIENT SURVEYS:  FOTO 31(45 predicted)  MUSCLE LENGTH: Hamstrings: Right 70 deg; Left 60 deg Thomas test: Unremarkable  POSTURE:  ER at LEs  PALPATION: TTP L piriformis   LOWER EXTREMITY ROM:  Active ROM Right eval Left eval  Hip flexion    Hip extension    Hip abduction  Hip adduction    Hip internal rotation    Hip external rotation    Knee flexion    Knee extension    Ankle dorsiflexion    Ankle plantarflexion    Ankle inversion    Ankle eversion     (Blank rows = not tested)  LOWER EXTREMITY MMT:  MMT Right eval Left eval  Hip flexion  4  Hip extension  4  Hip abduction  4-  Hip adduction    Hip internal rotation    Hip external rotation    Knee flexion    Knee extension    Ankle dorsiflexion    Ankle plantarflexion    Ankle inversion    Ankle eversion     (Blank rows = not tested)  LOWER EXTREMITY SPECIAL TESTS:  Hip  special tests: Luisa Hart (FABER) test: positive , Trendelenburg test: negative, and Piriformis test: positive   FUNCTIONAL TESTS:  5 times sit to stand: 19s arms crossed  GAIT: Distance walked: 53ft x2 Assistive device utilized: None Level of assistance: Complete Independence Comments: toe out gait pattern   TODAY'S TREATMENT:            [facility] Adult PT Treatment:                                                DATE: 06/03/23 Therapeutic Exercise: Nustep L5 x 4 min while taking subejctive Supine clamshell 3x10 RTB Bridge with ball 3x10 Supine SLR 2x10 each S/L clamshell 3x10  Standing hip abd 2x10 each Piriformis release with ball at wall x 2 min  Standing mini squat with UE support 2x10 LAQ 2x10 2# each  [facility] Adult PT Treatment:                                                DATE: 05/27/23 Therapeutic Exercise: Nustep L1 6 min tolerance L SKTC 30s x2 Supine hip fallouts YTB 10x B, 10/10 unilateral Supine march 10/10 PPT 3s 10x OH flexion with inspiration 10x Alt OH flexion 10/10 R S/L open book                                                                                                                   DATE: 05/19/23 Eval    PATIENT EDUCATION:  Education details: Discussed eval findings, rehab rationale and POC and patient is in agreement  Person educated: Patient Education method: Explanation Education comprehension: verbalized understanding and needs further education  HOME EXERCISE PROGRAM: Access Code: [email or web address] URL: [email or web address] Date: 05/19/2023 Prepared by: Gustavus Bryant  Exercises - Clamshell  - 1 x daily - 5 x weekly - 3 sets - 10 reps - Supine Piriformis Stretch with Foot on Ground  - 1 x daily - 5  x weekly - 1 sets - 2 reps - 30s hold  ASSESSMENT:  CLINICAL IMPRESSION: Pt was able to complete prescribed exercises with on adverse effect. Therapy focused on improving LE strength and functional mobility to decrease L LE pan.  Continues to benefit from skilled PT, will progress as able per POC.   Patient is a 52 y.o. female who was seen today for physical therapy evaluation and treatment for chronic L hip pain. Patient presents with restricted hip IR, isolated gluteus medius weakness and positive FAI impingement signs.  Palpation finds TTP L piriformis.  OBJECTIVE IMPAIRMENTS: Abnormal gait, decreased activity tolerance, decreased knowledge of condition, difficulty walking, decreased ROM, decreased strength, improper body mechanics, postural dysfunction, and pain.   ACTIVITY LIMITATIONS: carrying, lifting, bending, sitting, squatting, and stairs  PERSONAL FACTORS: Fitness, Past/current experiences, and Time since onset of injury/illness/exacerbation are also affecting patient's functional outcome.    GOALS: Goals reviewed with patient? No  SHORT TERM GOALS: Target date: 06/09/2023   Patient to demonstrate independence in HEP Baseline: [id number] Goal status: INITIAL   LONG TERM GOALS: Target date: 06/30/2023    Decrease pain to 6/10 at worst Baseline: 10/10 worst pain Goal status: INITIAL  2.  Increase LLE strength to 4+/5 Baseline:  MMT Right eval Left eval  Hip flexion  4  Hip extension  4  Hip abduction  4-   Goal status: INITIAL  3.  Increase FOTO score to 45 Baseline: 31 Goal status: INITIAL  4.  Minimize L piriformis tenderness Baseline: Moderate tenderness Goal status: INITIAL    PLAN:  PT FREQUENCY: 1-2x/week  PT DURATION: 6 weeks  PLANNED INTERVENTIONS: Therapeutic exercises, Therapeutic activity, Neuromuscular re-education, Balance training, Gait training, Patient/Family education, Self Care, Joint mobilization, Stair training, Aquatic Therapy, Dry Needling, Electrical stimulation, Cryotherapy, Moist heat, Manual therapy, and Re-evaluation  PLAN FOR NEXT SESSION: HEP review and update, manual techniques as appropriate, aerobic tasks, ROM and flexibility activities,  strengthening and PREs, TPDN, gait and balance training as needed    Check all possible CPT codes: 36644 - PT Re-evaluation, 97110- Therapeutic Exercise, [phone]- Neuro Re-education, [phone] - Gait Training, [phone] - Manual Therapy, 97530 - Therapeutic Activities, and 97535 - Self Care    Check all conditions that are expected to impact treatment: {Conditions expected to impact treatment:None of these apply   If treatment provided at initial evaluation, no treatment charged due to lack of authorization.        Eloy End, PT 06/03/2023, 3:24 PM

## 2023-06-03 NOTE — Telephone Encounter (Addendum)
Pharmacy Patient Advocate Encounter   Received notification from CoverMyMeds that prior authorization for Toujeo max 300u/ml 2 unit dail is required/requested.   Insurance verification completed.   The patient is insured through [employer] [name] .   Per test claim:  Lantus is preferred by the ins.  If suggested medication is appropriate, Please send in a new RX and discontinue this one. If not, please advise as to why it's not appropriate so that we may request a Prior Authorization. Lantus would be $4.  Pt is only using 30 units a day. Does she really need the concentrated insulin and 2 unit dial?

## 2023-06-03 NOTE — Telephone Encounter (Signed)
She can switch to Lantus, I have already sent in that alternative for her.

## 2023-06-04 ENCOUNTER — Ambulatory Visit (INDEPENDENT_AMBULATORY_CARE_PROVIDER_SITE_OTHER): Payer: MEDICAID | Admitting: Podiatry

## 2023-06-04 DIAGNOSIS — L0889 Other specified local infections of the skin and subcutaneous tissue: Secondary | ICD-10-CM

## 2023-06-04 DIAGNOSIS — Z794 Long term (current) use of insulin: Secondary | ICD-10-CM

## 2023-06-04 DIAGNOSIS — E1165 Type 2 diabetes mellitus with hyperglycemia: Secondary | ICD-10-CM | POA: Diagnosis not present

## 2023-06-04 DIAGNOSIS — S90851A Superficial foreign body, right foot, initial encounter: Secondary | ICD-10-CM

## 2023-06-04 MED ORDER — DOXYCYCLINE HYCLATE 100 MG PO TABS
100.0000 mg | ORAL_TABLET | Freq: Two times a day (BID) | ORAL | 0 refills | Status: DC
Start: 2023-06-04 — End: 2023-07-20

## 2023-06-04 NOTE — Progress Notes (Signed)
Subjective:  Patient ID: Lacey Bradshaw, female    DOB: 12/25/1970,  MRN: [id number]  Chief Complaint  Patient presents with   Foot Pain    Right foot possible foreign body started 3 weeks ago forefoot, Diabetic A1c- 9.5    52 y.o. female presents with the above complaint.  Patient presents with right foot foreign body/glass.  Patient states that she is a diabetic.  It started 3 weeks ago she was to make sure she does not lose her foot.  Her last A1c was 11%.  She is working on control.  She would like to have the foreign body removed.  She has not seen anyone else prior to seeing me denies any other acute complaints she does not recall stepping on anything   Review of Systems: Negative except as noted in the HPI. Denies N/V/F/Ch.  Past Medical History:  Diagnosis Date   Acid reflux    Bulging of lumbar intervertebral disc    Diabetes mellitus without complication (HCC)    borderline   Diabetes mellitus, type II (HCC)    Gastroparesis    Hyperlipidemia    Hyposomnia, insomnia or sleeplessness associated with anxiety    Migraine     Current Outpatient Medications:    doxycycline (VIBRA-TABS) 100 MG tablet, Take 1 tablet (100 mg total) by mouth 2 (two) times daily., Disp: 20 tablet, Rfl: 0   ALPRAZolam (XANAX) 1 MG tablet, Take 1 mg by mouth 2 (two) times daily as needed. anxiety, Disp: , Rfl: 0   alprazolam (XANAX) 2 MG tablet, Take 2 mg by mouth at bedtime., Disp: , Rfl:    atorvastatin (LIPITOR) 40 MG tablet, Take 40 mg by mouth daily., Disp: , Rfl:    BD VEO INSULIN SYRINGE U/F 31G X 15/64" 0.3 ML MISC, ONE SYRINGE PER INSULIN USE, Disp: , Rfl:    celecoxib (CELEBREX) 200 MG capsule, Take 200 mg by mouth 2 (two) times daily., Disp: , Rfl:    cetirizine (ZYRTEC) 10 MG tablet, Take 10 mg by mouth as needed., Disp: , Rfl:    Continuous Glucose Sensor (DEXCOM G7 SENSOR) MISC, Inject 1 Application into the skin as directed. Change sensor every 10 days as directed., Disp: 9 each,  Rfl: 3   cyclobenzaprine (FLEXERIL) 5 MG tablet, Take 1 tablet (5 mg total) by mouth at bedtime., Disp: 30 tablet, Rfl: 2   diclofenac Sodium (PENNSAID) 2 % SOLN, Apply 2 Pump (40 mg total) topically 2 (two) times daily as needed., Disp: 112 g, Rfl: 2   docusate sodium (COLACE) 100 MG capsule, Take 1 capsule (100 mg total) by mouth every 12 (twelve) hours., Disp: 60 capsule, Rfl: 0   DULoxetine (CYMBALTA) 20 MG capsule, Take 1 tablet by mouth daily., Disp: , Rfl:    gabapentin (NEURONTIN) 300 MG capsule, Take 300 mg by mouth as needed., Disp: , Rfl:    hydrALAZINE (APRESOLINE) 25 MG tablet, Take by mouth daily., Disp: , Rfl:    HYDROcodone-acetaminophen (NORCO) 10-325 MG tablet, Take 1 tablet by mouth in the morning and at bedtime., Disp: , Rfl:    insulin aspart (NOVOLOG FLEXPEN) 100 UNIT/ML FlexPen, Inject 8-14 Units into the skin 3 (three) times daily with meals., Disp: 30 mL, Rfl: 3   insulin glargine (LANTUS SOLOSTAR) 100 UNIT/ML Solostar Pen, Inject 30 Units into the skin at bedtime., Disp: 27 mL, Rfl: 3   lidocaine-prilocaine (EMLA) cream, SMARTSIG:2-3 Gram(s) Topical 4 Times Daily, Disp: , Rfl:    linaclotide (LINZESS) 145 MCG  CAPS capsule, Take 1 capsule every day by oral route for 90 days., Disp: , Rfl:    nystatin cream (MYCOSTATIN), Apply 1 application  topically as needed., Disp: , Rfl:    ondansetron (ZOFRAN-ODT) 4 MG disintegrating tablet, Take 1 tablet (4 mg total) by mouth every 8 (eight) hours as needed for nausea or vomiting., Disp: 20 tablet, Rfl: 0   pantoprazole (PROTONIX) 40 MG tablet, Take 40 mg by mouth 2 (two) times daily., Disp: , Rfl:    polyethylene glycol (MIRALAX) 17 g packet, Take 17 g by mouth daily., Disp: 14 each, Rfl: 0   pregabalin (LYRICA) 100 MG capsule, Take 100 mg by mouth 3 (three) times daily., Disp: , Rfl:    triamcinolone cream (KENALOG) 0.1 %, Apply 1 Application topically 2 (two) times daily., Disp: , Rfl:    valACYclovir (VALTREX) 1000 MG tablet, Take  1,000 mg by mouth daily., Disp: , Rfl:    VENTOLIN HFA 108 (90 Base) MCG/ACT inhaler, Inhale 1-2 puffs into the lungs every 4 (four) hours as needed for wheezing or shortness of breath., Disp: , Rfl:    vortioxetine HBr (TRINTELLIX) 5 MG TABS tablet, Take 5 mg by mouth daily., Disp: , Rfl:   Social History   Tobacco Use  Smoking Status Never   Passive exposure: Past  Smokeless Tobacco Never    Allergies  Allergen Reactions   Dapagliflozin Other (See Comments)    Nausea and vomiting   Dulaglutide     Other reaction(s): Makes her feel terrible.   Liraglutide     Other reaction(s): burping & belching   Objective:  There were no vitals filed for this visit. There is no height or weight on file to calculate BMI. Constitutional Well developed. Well nourished.  Vascular Dorsalis pedis pulses palpable bilaterally. Posterior tibial pulses palpable bilaterally. Capillary refill normal to all digits.  No cyanosis or clubbing noted. Pedal hair growth normal.  Neurologic Normal speech. Oriented to person, place, and time. Epicritic sensation to light touch grossly present bilaterally.  Dermatologic Superficial puncture wound noted to the right midfoot/forefoot.  Entry point noted no cellulitis noted no purulent drainage noted.  Orthopedic: Normal joint ROM without pain or crepitus bilaterally. No visible deformities. No bony tenderness.   Radiographs: None Assessment:   1. Foreign body of skin of plantar aspect of right foot   2. Type 2 diabetes mellitus with hyperglycemia, with long-term current use of insulin (HCC)    Plan:  Patient was evaluated and treated and all questions answered.  Right forefoot foreign body (glass) -All questions and concerns were discussed with the patient in extensive detail.  At this time patient will benefit from removal of foreign body. -Doxycycline was dispensed for skin and soft tissue prophylaxis.  If any signs of infection worsens or produces  she will go to the emergency room or come see [place] right away.  She states understanding  Foreign body removal -Skin was prepped in standard technique with Betadine.  One-to-one mixture of 1% lidocaine plain half percent Marcaine plain was injected circumferentially in V-block fashion 3 cc.  Using chisel blade and pickup the skin was debrided down until the level of the glass.  At this time the glass was removed in its entirety.  Patient had immediate relief.  No complication noted.  This appears to be superficial in nature.  No deep components found.  No follow-ups on file.

## 2023-06-08 NOTE — Therapy (Signed)
OUTPATIENT PHYSICAL THERAPY TREATMENT NOTE   Patient Name: Lacey Bradshaw MRN: [id number] DOB:1971/03/04, 52 y.o., female Today's Date: 06/09/2023  END OF SESSION:  PT End of Session - 06/09/23 1530     Visit Number 4    Number of Visits 6    Date for PT Re-Evaluation 07/20/23    Authorization Type [employer]    PT Start Time 1530    PT Stop Time 1608    PT Time Calculation (min) 38 min    Activity Tolerance Patient tolerated treatment well    Behavior During Therapy WFL for tasks assessed/performed                Past Medical History:  Diagnosis Date   Acid reflux    Bulging of lumbar intervertebral disc    Diabetes mellitus without complication (HCC)    borderline   Diabetes mellitus, type II (HCC)    Gastroparesis    Hyperlipidemia    Hyposomnia, insomnia or sleeplessness associated with anxiety    Migraine    Past Surgical History:  Procedure Laterality Date   ABDOMINAL HYSTERECTOMY     2009   CARPAL TUNNEL RELEASE Left    CATARACT EXTRACTION Bilateral 03/2022   FRACTURE SURGERY     jaw   TUBAL LIGATION     Patient Active Problem List   Diagnosis Date Noted   Pain in left hip 04/06/2023   Bilateral primary osteoarthritis of knee 04/06/2023   Bilateral shoulder pain 09/10/2022   Bilateral hand pain 09/10/2022   Abnormal renal function 06/17/2022   Cervical radiculopathy 06/16/2022   Ulnar neuropathy of right upper extremity 06/15/2022   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   Major depressive disorder 03/09/2022   Chronic pain syndrome 11/20/2021   Neuropathy 08/15/2021   Abnormal vision 08/15/2021   Chronic insomnia 08/15/2021   Contact dermatitis 08/15/2021   Dizziness 08/15/2021   Syncope 08/15/2021   Cataracts, bilateral 06/28/2021   Type 2 diabetes mellitus (HCC) 03/21/2021   Gastroesophageal reflux disease without esophagitis 03/21/2021   Migraine 03/21/2021   Morbid obesity (HCC) 03/21/2021   Mixed hyperlipidemia 03/21/2021   Chronic  bilateral low back pain 10/17/2018   Spondylolisthesis, lumbar region 07/04/2018   Essential (primary) hypertension 07/04/2018   Carpal tunnel syndrome 06/01/2013    PCP: Benita Stabile, MD   REFERRING PROVIDER: Fuller Plan, MD  REFERRING DIAG: [id number] (ICD-10-CM) - Pain in left hip  THERAPY DIAG:  Pain in left hip  Muscle weakness (generalized)  Rationale for Evaluation and Treatment: Rehabilitation  ONSET DATE: chronic  SUBJECTIVE:   SUBJECTIVE STATEMENT: Pt presents to PT with reports of continued left hip pain and discomfort. Has been compliant with HEP with no adverse effect.    PERTINENT HISTORY: See PMH  PAIN:  Are you having pain?  Yes: NPRS scale: 7/10 Pain location: L hip Pain description: ache Aggravating factors: walking, lying L side Relieving factors: ice   PRECAUTIONS: None  WEIGHT BEARING RESTRICTIONS: No  FALLS:  Has patient fallen in last 6 months? No  OCCUPATION: not working  PLOF: Independent  PATIENT GOALS: To manage my L hip pain  NEXT MD VISIT: PRN  OBJECTIVE:   DIAGNOSTIC FINDINGS: none  PATIENT SURVEYS:  FOTO 31(45 predicted)  MUSCLE LENGTH: Hamstrings: Right 70 deg; Left 60 deg Thomas test: Unremarkable  POSTURE:  ER at LEs  PALPATION: TTP L piriformis   LOWER EXTREMITY ROM:  Active ROM Right eval Left eval  Hip flexion    Hip extension  Hip abduction    Hip adduction    Hip internal rotation    Hip external rotation    Knee flexion    Knee extension    Ankle dorsiflexion    Ankle plantarflexion    Ankle inversion    Ankle eversion     (Blank rows = not tested)  LOWER EXTREMITY MMT:  MMT Right eval Left eval  Hip flexion  4  Hip extension  4  Hip abduction  4-  Hip adduction    Hip internal rotation    Hip external rotation    Knee flexion    Knee extension    Ankle dorsiflexion    Ankle plantarflexion    Ankle inversion    Ankle eversion     (Blank rows = not tested)  LOWER  EXTREMITY SPECIAL TESTS:  Hip special tests: Luisa Hart (FABER) test: positive , Trendelenburg test: negative, and Piriformis test: positive   FUNCTIONAL TESTS:  5 times sit to stand: 19s arms crossed  GAIT: Distance walked: 25ft x2 Assistive device utilized: None Level of assistance: Complete Independence Comments: toe out gait pattern   TODAY'S TREATMENT:            [facility] Adult PT Treatment:                                                DATE: 06/09/23 Therapeutic Exercise: Nustep L5 x 4 min while taking subejctive Supine clamshell 2x10 YTB Supine march YTB 2x20 Bridge 3x10 Supine SLR 2x10 each LAQ 2x10 2# STS 2x10 - no UE  Standing hip abd/ext 2x10 each Lateral step up 2x10 4in L leading  [facility] Adult PT Treatment:                                                DATE: 06/03/23 Therapeutic Exercise: Nustep L5 x 4 min while taking subejctive Supine clamshell 3x10 RTB Bridge with ball 3x10 Supine SLR 2x10 each S/L clamshell 3x10  Standing hip abd 2x10 each Piriformis release with ball at wall x 2 min  Standing mini squat with UE support 2x10 LAQ 2x10 2# each  [facility] Adult PT Treatment:                                                DATE: 05/27/23 Therapeutic Exercise: Nustep L1 6 min tolerance L SKTC 30s x2 Supine hip fallouts YTB 10x B, 10/10 unilateral Supine march 10/10 PPT 3s 10x OH flexion with inspiration 10x Alt OH flexion 10/10 R S/L open book  DATE: 05/19/23 Eval    PATIENT EDUCATION:  Education details: Discussed eval findings, rehab rationale and POC and patient is in agreement  Person educated: Patient Education method: Explanation Education comprehension: verbalized understanding and needs further education  HOME EXERCISE PROGRAM: Access Code: [email or web address] URL: [email or web address] Date: 05/19/2023 Prepared by: Gustavus Bryant  Exercises - Clamshell  - 1 x daily - 5 x weekly - 3 sets - 10 reps - Supine Piriformis Stretch with Foot on Ground  - 1 x daily - 5 x weekly - 1 sets - 2 reps - 30s hold  ASSESSMENT:  CLINICAL IMPRESSION: Pt was able to complete prescribed exercises with on adverse effect. Therapy focused on improving LE strength and functional mobility to decrease L LE pan. Continues to benefit from skilled PT, will progress as able per POC.   Patient is a 52 y.o. female who was seen today for physical therapy evaluation and treatment for chronic L hip pain. Patient presents with restricted hip IR, isolated gluteus medius weakness and positive FAI impingement signs.  Palpation finds TTP L piriformis.  OBJECTIVE IMPAIRMENTS: Abnormal gait, decreased activity tolerance, decreased knowledge of condition, difficulty walking, decreased ROM, decreased strength, improper body mechanics, postural dysfunction, and pain.   ACTIVITY LIMITATIONS: carrying, lifting, bending, sitting, squatting, and stairs  PERSONAL FACTORS: Fitness, Past/current experiences, and Time since onset of injury/illness/exacerbation are also affecting patient's functional outcome.    GOALS: Goals reviewed with patient? No  SHORT TERM GOALS: Target date: 06/09/2023   Patient to demonstrate independence in HEP Baseline: [id number] Goal status: INITIAL   LONG TERM GOALS: Target date: 06/30/2023    Decrease pain to 6/10 at worst Baseline: 10/10 worst pain Goal status: INITIAL  2.  Increase LLE strength to 4+/5 Baseline:  MMT Right eval Left eval  Hip flexion  4  Hip extension  4  Hip abduction  4-   Goal status: INITIAL  3.  Increase FOTO score to 45 Baseline: 31 Goal status: INITIAL  4.  Minimize L piriformis tenderness Baseline: Moderate tenderness Goal status: INITIAL    PLAN:  PT FREQUENCY: 1-2x/week  PT DURATION: 6 weeks  PLANNED INTERVENTIONS: Therapeutic exercises, Therapeutic activity,  Neuromuscular re-education, Balance training, Gait training, Patient/Family education, Self Care, Joint mobilization, Stair training, Aquatic Therapy, Dry Needling, Electrical stimulation, Cryotherapy, Moist heat, Manual therapy, and Re-evaluation  PLAN FOR NEXT SESSION: HEP review and update, manual techniques as appropriate, aerobic tasks, ROM and flexibility activities, strengthening and PREs, TPDN, gait and balance training as needed     Eloy End, PT 06/09/2023, 4:09 PM

## 2023-06-09 ENCOUNTER — Ambulatory Visit: Payer: MEDICAID

## 2023-06-09 DIAGNOSIS — G5702 Lesion of sciatic nerve, left lower limb: Secondary | ICD-10-CM | POA: Diagnosis not present

## 2023-06-09 DIAGNOSIS — M25552 Pain in left hip: Secondary | ICD-10-CM

## 2023-06-09 DIAGNOSIS — M6281 Muscle weakness (generalized): Secondary | ICD-10-CM

## 2023-06-16 ENCOUNTER — Ambulatory Visit: Payer: MEDICAID | Attending: Internal Medicine

## 2023-06-16 DIAGNOSIS — M25552 Pain in left hip: Secondary | ICD-10-CM | POA: Insufficient documentation

## 2023-06-16 DIAGNOSIS — G5702 Lesion of sciatic nerve, left lower limb: Secondary | ICD-10-CM | POA: Insufficient documentation

## 2023-06-16 DIAGNOSIS — M6281 Muscle weakness (generalized): Secondary | ICD-10-CM | POA: Insufficient documentation

## 2023-06-16 NOTE — Therapy (Signed)
OUTPATIENT PHYSICAL THERAPY TREATMENT NOTE   Patient Name: Willo Damon MRN: [id number] DOB:06-Jan-1971, 52 y.o., female Today's Date: 06/16/2023  END OF SESSION:  PT End of Session - 06/16/23 1439     Visit Number 5    Number of Visits 6    Date for PT Re-Evaluation 07/20/23    Authorization Type [employer]    PT Start Time 1443    PT Stop Time 1521    PT Time Calculation (min) 38 min    Activity Tolerance Patient tolerated treatment well    Behavior During Therapy WFL for tasks assessed/performed              Past Medical History:  Diagnosis Date   Acid reflux    Bulging of lumbar intervertebral disc    Diabetes mellitus without complication (HCC)    borderline   Diabetes mellitus, type II (HCC)    Gastroparesis    Hyperlipidemia    Hyposomnia, insomnia or sleeplessness associated with anxiety    Migraine    Past Surgical History:  Procedure Laterality Date   ABDOMINAL HYSTERECTOMY     2009   CARPAL TUNNEL RELEASE Left    CATARACT EXTRACTION Bilateral 03/2022   FRACTURE SURGERY     jaw   TUBAL LIGATION     Patient Active Problem List   Diagnosis Date Noted   Pain in left hip 04/06/2023   Bilateral primary osteoarthritis of knee 04/06/2023   Bilateral shoulder pain 09/10/2022   Bilateral hand pain 09/10/2022   Abnormal renal function 06/17/2022   Cervical radiculopathy 06/16/2022   Ulnar neuropathy of right upper extremity 06/15/2022   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   Major depressive disorder 03/09/2022   Chronic pain syndrome 11/20/2021   Neuropathy 08/15/2021   Abnormal vision 08/15/2021   Chronic insomnia 08/15/2021   Contact dermatitis 08/15/2021   Dizziness 08/15/2021   Syncope 08/15/2021   Cataracts, bilateral 06/28/2021   Type 2 diabetes mellitus (HCC) 03/21/2021   Gastroesophageal reflux disease without esophagitis 03/21/2021   Migraine 03/21/2021   Morbid obesity (HCC) 03/21/2021   Mixed hyperlipidemia 03/21/2021   Chronic  bilateral low back pain 10/17/2018   Spondylolisthesis, lumbar region 07/04/2018   Essential (primary) hypertension 07/04/2018   Carpal tunnel syndrome 06/01/2013    PCP: Benita Stabile, MD   REFERRING PROVIDER: Fuller Plan, MD  REFERRING DIAG: [phone] (ICD-10-CM) - Pain in left hip  THERAPY DIAG:  Pain in left hip  Piriformis syndrome, left  Muscle weakness (generalized)  Rationale for Evaluation and Treatment: Rehabilitation  ONSET DATE: chronic  SUBJECTIVE:   SUBJECTIVE STATEMENT: Patient reports that her pain is higher today, says that the impending weather is also increasing her pain.    PERTINENT HISTORY: See PMH  PAIN:  Are you having pain?  Yes: NPRS scale: 8/10 Pain location: L hip Pain description: ache Aggravating factors: walking, lying L side Relieving factors: ice   PRECAUTIONS: None  WEIGHT BEARING RESTRICTIONS: No  FALLS:  Has patient fallen in last 6 months? No  OCCUPATION: not working  PLOF: Independent  PATIENT GOALS: To manage my L hip pain  NEXT MD VISIT: PRN  OBJECTIVE:   DIAGNOSTIC FINDINGS: none  PATIENT SURVEYS:  FOTO 31(45 predicted)  MUSCLE LENGTH: Hamstrings: Right 70 deg; Left 60 deg Thomas test: Unremarkable  POSTURE:  ER at LEs  PALPATION: TTP L piriformis   LOWER EXTREMITY ROM:  Active ROM Right eval Left eval  Hip flexion    Hip extension  Hip abduction    Hip adduction    Hip internal rotation    Hip external rotation    Knee flexion    Knee extension    Ankle dorsiflexion    Ankle plantarflexion    Ankle inversion    Ankle eversion     (Blank rows = not tested)  LOWER EXTREMITY MMT:  MMT Right eval Left eval  Hip flexion  4  Hip extension  4  Hip abduction  4-  Hip adduction    Hip internal rotation    Hip external rotation    Knee flexion    Knee extension    Ankle dorsiflexion    Ankle plantarflexion    Ankle inversion    Ankle eversion     (Blank rows = not  tested)  LOWER EXTREMITY SPECIAL TESTS:  Hip special tests: Luisa Hart (FABER) test: positive , Trendelenburg test: negative, and Piriformis test: positive   FUNCTIONAL TESTS:  5 times sit to stand: 19s arms crossed  GAIT: Distance walked: 34ft x2 Assistive device utilized: None Level of assistance: Complete Independence Comments: toe out gait pattern   TODAY'S TREATMENT:       [facility] Adult PT Treatment:                                                DATE: 06/16/23 Therapeutic Exercise: Nustep L5 x 6 min while taking subejctive Supine QL stretch 2x30" Lt Sidelying clamshell no resistance 2x10 Lt Supine march 2# 2x30" Supine figure 4 piriformis stretch 3x30" Lt Bridge 2x10 Supine SLR 2x10 each LAQ 2x10 2# STS 2x10 - arms crossed        [facility] Adult PT Treatment:                                                DATE: 06/09/23 Therapeutic Exercise: Nustep L5 x 4 min while taking subejctive Supine clamshell 2x10 YTB Supine march YTB 2x20 Bridge 3x10 Supine SLR 2x10 each LAQ 2x10 2# STS 2x10 - no UE  Standing hip abd/ext 2x10 each Lateral step up 2x10 4in L leading  [facility] Adult PT Treatment:                                                DATE: 06/03/23 Therapeutic Exercise: Nustep L5 x 4 min while taking subejctive Supine clamshell 3x10 RTB Bridge with ball 3x10 Supine SLR 2x10 each S/L clamshell 3x10  Standing hip abd 2x10 each Piriformis release with ball at wall x 2 min  Standing mini squat with UE support 2x10 LAQ 2x10 2# each    PATIENT EDUCATION:  Education details: Discussed eval findings, rehab rationale and POC and patient is in agreement  Person educated: Patient Education method: Explanation Education comprehension: verbalized understanding and needs further education  HOME EXERCISE PROGRAM: Access Code: [email or web address] URL: [email or web address] Date: 05/19/2023 Prepared by: Gustavus Bryant  Exercises - Clamshell  - 1 x daily - 5 x weekly - 3 sets -  10 reps - Supine Piriformis Stretch with Foot on Ground  - 1 x daily - 5 x weekly -  1 sets - 2 reps - 30s hold  ASSESSMENT:  CLINICAL IMPRESSION:  Patient presents to PT reporting increased pain in her Lt hip today and states that the rainy weather is making her pain worse. Session today focused on proximal hip strengthening and stretching with slight regression today due to increased pain. Patient was able to tolerate all prescribed exercises with no adverse effects. Patient continues to benefit from skilled PT services and should be progressed as able to improve functional independence.    Patient is a 52 y.o. female who was seen today for physical therapy evaluation and treatment for chronic L hip pain. Patient presents with restricted hip IR, isolated gluteus medius weakness and positive FAI impingement signs.  Palpation finds TTP L piriformis.  OBJECTIVE IMPAIRMENTS: Abnormal gait, decreased activity tolerance, decreased knowledge of condition, difficulty walking, decreased ROM, decreased strength, improper body mechanics, postural dysfunction, and pain.   ACTIVITY LIMITATIONS: carrying, lifting, bending, sitting, squatting, and stairs  PERSONAL FACTORS: Fitness, Past/current experiences, and Time since onset of injury/illness/exacerbation are also affecting patient's functional outcome.    GOALS: Goals reviewed with patient? No  SHORT TERM GOALS: Target date: 06/09/2023   Patient to demonstrate independence in HEP Baseline: [email or web address] Goal status: INITIAL   LONG TERM GOALS: Target date: 06/30/2023    Decrease pain to 6/10 at worst Baseline: 10/10 worst pain Goal status: INITIAL  2.  Increase LLE strength to 4+/5 Baseline:  MMT Right eval Left eval  Hip flexion  4  Hip extension  4  Hip abduction  4-   Goal status: INITIAL  3.  Increase FOTO score to 45 Baseline: 31 Goal status: INITIAL  4.  Minimize L piriformis tenderness Baseline: Moderate tenderness Goal  status: INITIAL    PLAN:  PT FREQUENCY: 1-2x/week  PT DURATION: 6 weeks  PLANNED INTERVENTIONS: Therapeutic exercises, Therapeutic activity, Neuromuscular re-education, Balance training, Gait training, Patient/Family education, Self Care, Joint mobilization, Stair training, Aquatic Therapy, Dry Needling, Electrical stimulation, Cryotherapy, Moist heat, Manual therapy, and Re-evaluation  PLAN FOR NEXT SESSION: HEP review and update, manual techniques as appropriate, aerobic tasks, ROM and flexibility activities, strengthening and PREs, TPDN, gait and balance training as needed     Berta Minor, PTA 06/16/2023, 3:22 PM

## 2023-06-22 ENCOUNTER — Other Ambulatory Visit (HOSPITAL_COMMUNITY): Payer: Self-pay

## 2023-06-22 ENCOUNTER — Telehealth: Payer: Self-pay

## 2023-06-22 NOTE — Telephone Encounter (Signed)
Pharmacy Patient Advocate Encounter   Received notification from CoverMyMeds that prior authorization for Insulin Glargine-yfgn 100UNIT/ML pen-injectors and Insulin Glargine Max SoloStar 300UNIT/ML pen-injectors is required/requested.   Insurance verification completed.   The patient is insured through [employer] [place] [name] .   Per test claim: The current 90 day co-pay is, $4 for each.  No PA needed at this time. This test claim was processed through [facility]- copay amounts may vary at other pharmacies due to pharmacy/plan contracts, or as the patient moves through the different stages of their insurance plan.

## 2023-06-22 NOTE — Telephone Encounter (Signed)
Pharmacy Patient Advocate Encounter   Received notification from CoverMyMeds that prior authorization for Dexcom G6 sensor is required/requested.   Insurance verification completed.   The patient is insured through [name] .   Per test claim: PA required; PA submitted to [facility] via CoverMyMeds Key/confirmation #/[id number] [facility] Status is pending

## 2023-06-23 ENCOUNTER — Ambulatory Visit: Payer: MEDICAID

## 2023-06-23 DIAGNOSIS — M6281 Muscle weakness (generalized): Secondary | ICD-10-CM

## 2023-06-23 DIAGNOSIS — M25552 Pain in left hip: Secondary | ICD-10-CM

## 2023-06-23 DIAGNOSIS — G5702 Lesion of sciatic nerve, left lower limb: Secondary | ICD-10-CM

## 2023-06-23 NOTE — Therapy (Signed)
OUTPATIENT PHYSICAL THERAPY TREATMENT NOTE   Patient Name: Lacey Bradshaw MRN: [id number] DOB:1971-07-15, 52 y.o., female Today's Date: 06/23/2023  END OF SESSION:  PT End of Session - 06/23/23 1442     Visit Number 6    Date for PT Re-Evaluation 07/20/23    Authorization Type [employer]    PT Start Time 1445    PT Stop Time 1525    PT Time Calculation (min) 40 min    Activity Tolerance Patient tolerated treatment well    Behavior During Therapy WFL for tasks assessed/performed             Past Medical History:  Diagnosis Date   Acid reflux    Bulging of lumbar intervertebral disc    Diabetes mellitus without complication (HCC)    borderline   Diabetes mellitus, type II (HCC)    Gastroparesis    Hyperlipidemia    Hyposomnia, insomnia or sleeplessness associated with anxiety    Migraine    Past Surgical History:  Procedure Laterality Date   ABDOMINAL HYSTERECTOMY     2009   CARPAL TUNNEL RELEASE Left    CATARACT EXTRACTION Bilateral 03/2022   FRACTURE SURGERY     jaw   TUBAL LIGATION     Patient Active Problem List   Diagnosis Date Noted   Pain in left hip 04/06/2023   Bilateral primary osteoarthritis of knee 04/06/2023   Bilateral shoulder pain 09/10/2022   Bilateral hand pain 09/10/2022   Abnormal renal function 06/17/2022   Cervical radiculopathy 06/16/2022   Ulnar neuropathy of right upper extremity 06/15/2022   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   Major depressive disorder 03/09/2022   Chronic pain syndrome 11/20/2021   Neuropathy 08/15/2021   Abnormal vision 08/15/2021   Chronic insomnia 08/15/2021   Contact dermatitis 08/15/2021   Dizziness 08/15/2021   Syncope 08/15/2021   Cataracts, bilateral 06/28/2021   Type 2 diabetes mellitus (HCC) 03/21/2021   Gastroesophageal reflux disease without esophagitis 03/21/2021   Migraine 03/21/2021   Morbid obesity (HCC) 03/21/2021   Mixed hyperlipidemia 03/21/2021   Chronic bilateral low back pain  10/17/2018   Spondylolisthesis, lumbar region 07/04/2018   Essential (primary) hypertension 07/04/2018   Carpal tunnel syndrome 06/01/2013    PCP: Benita Stabile, MD   REFERRING PROVIDER: Fuller Plan, MD  REFERRING DIAG: [phone] (ICD-10-CM) - Pain in left hip  THERAPY DIAG:  Pain in left hip  Piriformis syndrome, left  Muscle weakness (generalized)  Rationale for Evaluation and Treatment: Rehabilitation  ONSET DATE: chronic  SUBJECTIVE:   SUBJECTIVE STATEMENT: Patient reports that she has more pain on her L side today and that he L ankle/foot has been swollen.    PERTINENT HISTORY: See PMH  PAIN:  Are you having pain?  Yes: NPRS scale: 9.5/10 Pain location: L hip Pain description: ache Aggravating factors: walking, lying L side Relieving factors: ice   PRECAUTIONS: None  WEIGHT BEARING RESTRICTIONS: No  FALLS:  Has patient fallen in last 6 months? No  OCCUPATION: not working  PLOF: Independent  PATIENT GOALS: To manage my L hip pain  NEXT MD VISIT: PRN  OBJECTIVE:   DIAGNOSTIC FINDINGS: none  PATIENT SURVEYS:  FOTO 31(45 predicted)  MUSCLE LENGTH: Hamstrings: Right 70 deg; Left 60 deg Thomas test: Unremarkable  POSTURE:  ER at LEs  PALPATION: TTP L piriformis   LOWER EXTREMITY ROM:  Active ROM Right eval Left eval  Hip flexion    Hip extension    Hip abduction  Hip adduction    Hip internal rotation    Hip external rotation    Knee flexion    Knee extension    Ankle dorsiflexion    Ankle plantarflexion    Ankle inversion    Ankle eversion     (Blank rows = not tested)  LOWER EXTREMITY MMT:  MMT Right eval Left eval  Hip flexion  4  Hip extension  4  Hip abduction  4-  Hip adduction    Hip internal rotation    Hip external rotation    Knee flexion    Knee extension    Ankle dorsiflexion    Ankle plantarflexion    Ankle inversion    Ankle eversion     (Blank rows = not tested)  LOWER EXTREMITY SPECIAL  TESTS:  Hip special tests: Luisa Hart (FABER) test: positive , Trendelenburg test: negative, and Piriformis test: positive   FUNCTIONAL TESTS:  5 times sit to stand: 19s arms crossed  GAIT: Distance walked: 17ft x2 Assistive device utilized: None Level of assistance: Complete Independence Comments: toe out gait pattern   TODAY'S TREATMENT:       [facility] Adult PT Treatment:                                                DATE: 06/23/23 Therapeutic Exercise: Nustep L5 x 6 min while taking subjective Standing hip abd/ext 2x10 each Lateral step up x10 4in BIL Supine unilateral clamshell x10 BIL Supine QL stretch 2x30" BIL Supine march 2# 2x30" Supine figure 4 piriformis stretch 2x30" BIL Bridge 2x10   [facility] Adult PT Treatment:                                                DATE: 06/16/23 Therapeutic Exercise: Nustep L5 x 6 min while taking subejctive Supine QL stretch 2x30" Lt Sidelying clamshell no resistance 2x10 Lt Supine march 2# 2x30" Supine figure 4 piriformis stretch 3x30" Lt Bridge 2x10 Supine SLR 2x10 each LAQ 2x10 2# STS 2x10 - arms crossed        [facility] Adult PT Treatment:                                                DATE: 06/09/23 Therapeutic Exercise: Nustep L5 x 4 min while taking subejctive Supine clamshell 2x10 YTB Supine march YTB 2x20 Bridge 3x10 Supine SLR 2x10 each LAQ 2x10 2# STS 2x10 - no UE  Standing hip abd/ext 2x10 each Lateral step up 2x10 4in L leading    PATIENT EDUCATION:  Education details: Discussed eval findings, rehab rationale and POC and patient is in agreement  Person educated: Patient Education method: Explanation Education comprehension: verbalized understanding and needs further education  HOME EXERCISE PROGRAM: Access Code: [email or web address] URL: [email or web address] Date: 05/19/2023 Prepared by: Gustavus Bryant  Exercises - Clamshell  - 1 x daily - 5 x weekly - 3 sets - 10 reps - Supine Piriformis Stretch with Foot on  Ground  - 1 x daily - 5 x weekly - 1 sets - 2 reps - 30s hold  ASSESSMENT:  CLINICAL  IMPRESSION:  Patient presents to PT reporting increased pain in her Lt hip and states that her whole Lt side is hurting more today. Session today continued to focus on proximal hip strengthening and stretching as able within patients pain tolerance. Patient continues to benefit from skilled PT services and should be progressed as able to improve functional independence.    EVAL- Patient is a 51 y.o. female who was seen today for physical therapy evaluation and treatment for chronic L hip pain. Patient presents with restricted hip IR, isolated gluteus medius weakness and positive FAI impingement signs.  Palpation finds TTP L piriformis.  OBJECTIVE IMPAIRMENTS: Abnormal gait, decreased activity tolerance, decreased knowledge of condition, difficulty walking, decreased ROM, decreased strength, improper body mechanics, postural dysfunction, and pain.   ACTIVITY LIMITATIONS: carrying, lifting, bending, sitting, squatting, and stairs  PERSONAL FACTORS: Fitness, Past/current experiences, and Time since onset of injury/illness/exacerbation are also affecting patient's functional outcome.    GOALS: Goals reviewed with patient? No  SHORT TERM GOALS: Target date: 06/09/2023   Patient to demonstrate independence in HEP Baseline: [email or web address] Goal status: MET Pt reports adherence 06/23/23   LONG TERM GOALS: Target date: 06/30/2023    Decrease pain to 6/10 at worst Baseline: 10/10 worst pain Goal status: INITIAL  2.  Increase LLE strength to 4+/5 Baseline:  MMT Right eval Left eval  Hip flexion  4  Hip extension  4  Hip abduction  4-   Goal status: INITIAL  3.  Increase FOTO score to 45 Baseline: 31 Goal status: INITIAL  4.  Minimize L piriformis tenderness Baseline: Moderate tenderness Goal status: INITIAL    PLAN:  PT FREQUENCY: 1-2x/week  PT DURATION: 6 weeks  PLANNED INTERVENTIONS:  Therapeutic exercises, Therapeutic activity, Neuromuscular re-education, Balance training, Gait training, Patient/Family education, Self Care, Joint mobilization, Stair training, Aquatic Therapy, Dry Needling, Electrical stimulation, Cryotherapy, Moist heat, Manual therapy, and Re-evaluation  PLAN FOR NEXT SESSION: HEP review and update, manual techniques as appropriate, aerobic tasks, ROM and flexibility activities, strengthening and PREs, TPDN, gait and balance training as needed     Berta Minor, PTA 06/23/2023, 3:25 PM

## 2023-06-24 ENCOUNTER — Other Ambulatory Visit (HOSPITAL_COMMUNITY): Payer: Self-pay

## 2023-06-28 NOTE — Therapy (Addendum)
OUTPATIENT PHYSICAL THERAPY TREATMENT NOTE   Patient Name: Lacey Bradshaw MRN: [id number] DOB:02-09-1971, 52 y.o., female Today's Date: 09/24/2023  END OF SESSION: PHYSICAL THERAPY DISCHARGE SUMMARY  Visits from Start of Care: 7  Current functional level related to goals / functional outcomes: UTA   Remaining deficits: UTA   Education / Equipment: HEP   Patient agrees to discharge. Patient goals were partially met. Patient is being discharged due to not returning since the last visit.     Past Medical History:  Diagnosis Date   Acid reflux    Bulging of lumbar intervertebral disc    Diabetes mellitus without complication (HCC)    borderline   Diabetes mellitus, type II (HCC)    Gastroparesis    Hyperlipidemia    Hyposomnia, insomnia or sleeplessness associated with anxiety    Migraine    Past Surgical History:  Procedure Laterality Date   ABDOMINAL HYSTERECTOMY     2009   CARPAL TUNNEL RELEASE Left    CATARACT EXTRACTION Bilateral 03/2022   FRACTURE SURGERY     jaw   TUBAL LIGATION     Patient Active Problem List   Diagnosis Date Noted   Pain in left hip 04/06/2023   Bilateral primary osteoarthritis of knee 04/06/2023   Bilateral shoulder pain 09/10/2022   Bilateral hand pain 09/10/2022   Abnormal renal function 06/17/2022   Cervical radiculopathy 06/16/2022   Ulnar neuropathy of right upper extremity 06/15/2022   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   Major depressive disorder 03/09/2022   Chronic pain syndrome 11/20/2021   Neuropathy 08/15/2021   Abnormal vision 08/15/2021   Chronic insomnia 08/15/2021   Contact dermatitis 08/15/2021   Dizziness 08/15/2021   Syncope 08/15/2021   Cataracts, bilateral 06/28/2021   Type 2 diabetes mellitus (HCC) 03/21/2021   Gastroesophageal reflux disease without esophagitis 03/21/2021   Migraine 03/21/2021   Morbid obesity (HCC) 03/21/2021   Mixed hyperlipidemia 03/21/2021   Chronic bilateral low back pain  10/17/2018   Spondylolisthesis, lumbar region 07/04/2018   Essential (primary) hypertension 07/04/2018   Carpal tunnel syndrome 06/01/2013    PCP: Benita Stabile, MD   REFERRING PROVIDER: Fuller Plan, MD  REFERRING DIAG: [phone] (ICD-10-CM) - Pain in left hip  THERAPY DIAG:  Pain in left hip  Muscle weakness (generalized)  Piriformis syndrome, left  Rationale for Evaluation and Treatment: Rehabilitation  ONSET DATE: chronic  SUBJECTIVE:   SUBJECTIVE STATEMENT: Patient reports 8/10 level of pain, worse today as she was sitting for prolonged periods last night.   PERTINENT HISTORY: See PMH  PAIN:  Are you having pain?  Yes: NPRS scale: 9.5/10 Pain location: L hip Pain description: ache Aggravating factors: walking, lying L side Relieving factors: ice   PRECAUTIONS: None  WEIGHT BEARING RESTRICTIONS: No  FALLS:  Has patient fallen in last 6 months? No  OCCUPATION: not working  PLOF: Independent  PATIENT GOALS: To manage my L hip pain  NEXT MD VISIT: PRN  OBJECTIVE:   DIAGNOSTIC FINDINGS: none  PATIENT SURVEYS:  FOTO 31(45 predicted)  ;06/30/23 38  MUSCLE LENGTH: Hamstrings: Right 70 deg; Left 60 deg R 80d, L 80d Thomas test: Unremarkable; 06/30/23 tight L hip flexors  POSTURE:  ER at LEs  PALPATION: TTP L piriformis   LOWER EXTREMITY ROM:  Active ROM Right eval Left eval  Hip flexion    Hip extension    Hip abduction    Hip adduction    Hip internal rotation    Hip external rotation  Knee flexion    Knee extension    Ankle dorsiflexion    Ankle plantarflexion    Ankle inversion    Ankle eversion     (Blank rows = not tested)  LOWER EXTREMITY MMT:  MMT Right eval Left eval  Hip flexion  4  Hip extension  4  Hip abduction  4-  Hip adduction    Hip internal rotation    Hip external rotation    Knee flexion    Knee extension    Ankle dorsiflexion    Ankle plantarflexion    Ankle inversion    Ankle eversion      (Blank rows = not tested)  LOWER EXTREMITY SPECIAL TESTS:  Hip special tests: Luisa Hart (FABER) test: positive , Trendelenburg test: negative, and Piriformis test: positive   FUNCTIONAL TESTS:  5 times sit to stand: 19s arms crossed  06/30/23 17s arms crossed  GAIT: Distance walked: 5ft x2 Assistive device utilized: None Level of assistance: Complete Independence Comments: toe out gait pattern   TODAY'S TREATMENT:       [facility] Adult PT Treatment:                                                DATE: 06/30/23  Manual Therapy: Skilled palpation to identify taught and irritable bands in L piriformis Trigger Point Dry Needling Treatment: Pre-treatment instruction: Patient instructed on dry needling rationale, procedures, and possible side effects including pain during treatment (achy,cramping feeling), bruising, drop of blood, lightheadedness, nausea, sweating. Patient Consent Given: Yes Education handout provided: No Muscles treated: L piriformis  Needle size and number: .30x3mm x 1 Electrical stimulation performed: No Parameters: N/A Treatment response/outcome: Twitch response elicited, Palpable decrease in muscle tension, and less pain with movement Post-treatment instructions: Patient instructed to expect possible mild to moderate muscle soreness later today and/or tomorrow. Patient instructed in methods to reduce muscle soreness and to continue prescribed HEP. If patient was dry needled over the lung field, patient was instructed on signs and symptoms of pneumothorax and, however unlikely, to see immediate medical attention should they occur. Patient was also educated on signs and symptoms of infection and to seek medical attention should they occur. Patient verbalized understanding of these instructions and education.   Therapeutic Activity: 5x STS 17s FOTO retake   [facility] Adult PT Treatment:                                                DATE: 06/23/23 Therapeutic Exercise: Nustep L5  x 6 min while taking subjective Standing hip abd/ext 2x10 each Lateral step up x10 4in BIL Supine unilateral clamshell x10 BIL Supine QL stretch 2x30" BIL Supine march 2# 2x30" Supine figure 4 piriformis stretch 2x30" BIL Bridge 2x10   [facility] Adult PT Treatment:                                                DATE: 06/16/23 Therapeutic Exercise: Nustep L5 x 6 min while taking subejctive Supine QL stretch 2x30" Lt Sidelying clamshell no resistance 2x10 Lt Supine march 2# 2x30" Supine figure 4  piriformis stretch 3x30" Lt Bridge 2x10 Supine SLR 2x10 each LAQ 2x10 2# STS 2x10 - arms crossed        [facility] Adult PT Treatment:                                                DATE: 06/09/23 Therapeutic Exercise: Nustep L5 x 4 min while taking subejctive Supine clamshell 2x10 YTB Supine march YTB 2x20 Bridge 3x10 Supine SLR 2x10 each LAQ 2x10 2# STS 2x10 - no UE  Standing hip abd/ext 2x10 each Lateral step up 2x10 4in L leading    PATIENT EDUCATION:  Education details: Discussed eval findings, rehab rationale and POC and patient is in agreement  Person educated: Patient Education method: Explanation Education comprehension: verbalized understanding and needs further education  HOME EXERCISE PROGRAM: Access Code: [email or web address] URL: [email or web address] Date: 05/19/2023 Prepared by: Gustavus Bryant  Exercises - Clamshell  - 1 x daily - 5 x weekly - 3 sets - 10 reps - Supine Piriformis Stretch with Foot on Ground  - 1 x daily - 5 x weekly - 1 sets - 2 reps - 30s hold  ASSESSMENT:  CLINICAL IMPRESSION: Re-assessed progress at 6 visit mark.  Overall pain severity unchanged and fluctuating.  Slight gains in 5x STS.  FOTO score improved.  Still TTP L piriformis.  Introduce TPDN to L piriformis which improved symptoms.  Recommend continue OPPT 1w4 and assess benefit of ongoing TPDN   EVAL- Patient is a 52 y.o. female who was seen today for physical therapy evaluation and  treatment for chronic L hip pain. Patient presents with restricted hip IR, isolated gluteus medius weakness and positive FAI impingement signs.  Palpation finds TTP L piriformis.  OBJECTIVE IMPAIRMENTS: Abnormal gait, decreased activity tolerance, decreased knowledge of condition, difficulty walking, decreased ROM, decreased strength, improper body mechanics, postural dysfunction, and pain.   ACTIVITY LIMITATIONS: carrying, lifting, bending, sitting, squatting, and stairs  PERSONAL FACTORS: Fitness, Past/current experiences, and Time since onset of injury/illness/exacerbation are also affecting patient's functional outcome.    GOALS: Goals reviewed with patient? No  SHORT TERM GOALS: Target date: 06/09/2023   Patient to demonstrate independence in HEP Baseline: [email or web address] Goal status: MET Pt reports adherence 06/23/23   LONG TERM GOALS: Target date: 06/30/2023    Decrease pain to 6/10 at worst Baseline: 10/10 worst pain; 06/30/23 8/10 Goal status: INITIAL  2.  Increase LLE strength to 4+/5 Baseline:  MMT Right eval Left eval  Hip flexion  4  Hip extension  4  Hip abduction  4-   Goal status: Ongoing  3.  Increase FOTO score to 45 Baseline: 31; 06/30/23 38 Goal status: Ongoing  4.  Minimize L piriformis tenderness Baseline: Moderate tenderness; 06/30/23 Minimal following TPDN Goal status: Ongoing    PLAN:  PT FREQUENCY: 1-2x/week  PT DURATION: 6 weeks  PLANNED INTERVENTIONS: Therapeutic exercises, Therapeutic activity, Neuromuscular re-education, Balance training, Gait training, Patient/Family education, Self Care, Joint mobilization, Stair training, Aquatic Therapy, Dry Needling, Electrical stimulation, Cryotherapy, Moist heat, Manual therapy, and Re-evaluation  PLAN FOR NEXT SESSION: HEP review and update, manual techniques as appropriate, aerobic tasks, ROM and flexibility activities, strengthening and PREs, TPDN, gait and balance training as needed     Hildred Laser, PT 09/24/2023, 7:58 AM

## 2023-06-29 NOTE — Telephone Encounter (Signed)
Pharmacy Patient Advocate Encounter  Received notification from [facility] that Prior Authorization for Dexcom G6 sensor has been APPROVED until 06/20/2024   PA #/Case ID/Reference #: [id number]

## 2023-06-29 NOTE — Telephone Encounter (Signed)
Patient was called and a message was left, letting the patient know that her Dexcom G 6 had been approved.

## 2023-06-30 ENCOUNTER — Ambulatory Visit: Payer: MEDICAID

## 2023-06-30 DIAGNOSIS — G5702 Lesion of sciatic nerve, left lower limb: Secondary | ICD-10-CM

## 2023-06-30 DIAGNOSIS — M25552 Pain in left hip: Secondary | ICD-10-CM

## 2023-06-30 DIAGNOSIS — M6281 Muscle weakness (generalized): Secondary | ICD-10-CM

## 2023-07-06 NOTE — Therapy (Deleted)
OUTPATIENT PHYSICAL THERAPY TREATMENT NOTE   Patient Name: Lacey Bradshaw MRN: [id number] DOB:1971-09-12, 52 y.o., female Today's Date: 07/06/2023  END OF SESSION:     Past Medical History:  Diagnosis Date   Acid reflux    Bulging of lumbar intervertebral disc    Diabetes mellitus without complication (HCC)    borderline   Diabetes mellitus, type II (HCC)    Gastroparesis    Hyperlipidemia    Hyposomnia, insomnia or sleeplessness associated with anxiety    Migraine    Past Surgical History:  Procedure Laterality Date   ABDOMINAL HYSTERECTOMY     2009   CARPAL TUNNEL RELEASE Left    CATARACT EXTRACTION Bilateral 03/2022   FRACTURE SURGERY     jaw   TUBAL LIGATION     Patient Active Problem List   Diagnosis Date Noted   Pain in left hip 04/06/2023   Bilateral primary osteoarthritis of knee 04/06/2023   Bilateral shoulder pain 09/10/2022   Bilateral hand pain 09/10/2022   Abnormal renal function 06/17/2022   Cervical radiculopathy 06/16/2022   Ulnar neuropathy of right upper extremity 06/15/2022   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   Major depressive disorder 03/09/2022   Chronic pain syndrome 11/20/2021   Neuropathy 08/15/2021   Abnormal vision 08/15/2021   Chronic insomnia 08/15/2021   Contact dermatitis 08/15/2021   Dizziness 08/15/2021   Syncope 08/15/2021   Cataracts, bilateral 06/28/2021   Type 2 diabetes mellitus (HCC) 03/21/2021   Gastroesophageal reflux disease without esophagitis 03/21/2021   Migraine 03/21/2021   Morbid obesity (HCC) 03/21/2021   Mixed hyperlipidemia 03/21/2021   Chronic bilateral low back pain 10/17/2018   Spondylolisthesis, lumbar region 07/04/2018   Essential (primary) hypertension 07/04/2018   Carpal tunnel syndrome 06/01/2013    PCP: Benita Stabile, MD   REFERRING PROVIDER: Fuller Plan, MD  REFERRING DIAG: [phone] (ICD-10-CM) - Pain in left hip  THERAPY DIAG:  No diagnosis found.  Rationale for Evaluation and  Treatment: Rehabilitation  ONSET DATE: chronic  SUBJECTIVE:   SUBJECTIVE STATEMENT: Patient reports 8/10 level of pain, worse today as she was sitting for prolonged periods last night.   PERTINENT HISTORY: See PMH  PAIN:  Are you having pain?  Yes: NPRS scale: 9.5/10 Pain location: L hip Pain description: ache Aggravating factors: walking, lying L side Relieving factors: ice   PRECAUTIONS: None  WEIGHT BEARING RESTRICTIONS: No  FALLS:  Has patient fallen in last 6 months? No  OCCUPATION: not working  PLOF: Independent  PATIENT GOALS: To manage my L hip pain  NEXT MD VISIT: PRN  OBJECTIVE:   DIAGNOSTIC FINDINGS: none  PATIENT SURVEYS:  FOTO 31(45 predicted)  ;06/30/23 38  MUSCLE LENGTH: Hamstrings: Right 70 deg; Left 60 deg R 80d, L 80d Thomas test: Unremarkable; 06/30/23 tight L hip flexors  POSTURE:  ER at LEs  PALPATION: TTP L piriformis   LOWER EXTREMITY ROM:  Active ROM Right eval Left eval  Hip flexion    Hip extension    Hip abduction    Hip adduction    Hip internal rotation    Hip external rotation    Knee flexion    Knee extension    Ankle dorsiflexion    Ankle plantarflexion    Ankle inversion    Ankle eversion     (Blank rows = not tested)  LOWER EXTREMITY MMT:  MMT Right eval Left eval  Hip flexion  4  Hip extension  4  Hip abduction  4-  Hip adduction  Hip internal rotation    Hip external rotation    Knee flexion    Knee extension    Ankle dorsiflexion    Ankle plantarflexion    Ankle inversion    Ankle eversion     (Blank rows = not tested)  LOWER EXTREMITY SPECIAL TESTS:  Hip special tests: Luisa Hart (FABER) test: positive , Trendelenburg test: negative, and Piriformis test: positive   FUNCTIONAL TESTS:  5 times sit to stand: 19s arms crossed  06/30/23 17s arms crossed  GAIT: Distance walked: 36ft x2 Assistive device utilized: None Level of assistance: Complete Independence Comments: toe out gait  pattern   TODAY'S TREATMENT:       [facility] Adult PT Treatment:                                                DATE: 06/30/23  Manual Therapy: Skilled palpation to identify taught and irritable bands in L piriformis Trigger Point Dry Needling Treatment: Pre-treatment instruction: Patient instructed on dry needling rationale, procedures, and possible side effects including pain during treatment (achy,cramping feeling), bruising, drop of blood, lightheadedness, nausea, sweating. Patient Consent Given: Yes Education handout provided: No Muscles treated: L piriformis  Needle size and number: .30x58mm x 1 Electrical stimulation performed: No Parameters: N/A Treatment response/outcome: Twitch response elicited, Palpable decrease in muscle tension, and less pain with movement Post-treatment instructions: Patient instructed to expect possible mild to moderate muscle soreness later today and/or tomorrow. Patient instructed in methods to reduce muscle soreness and to continue prescribed HEP. If patient was dry needled over the lung field, patient was instructed on signs and symptoms of pneumothorax and, however unlikely, to see immediate medical attention should they occur. Patient was also educated on signs and symptoms of infection and to seek medical attention should they occur. Patient verbalized understanding of these instructions and education.   Therapeutic Activity: 5x STS 17s FOTO retake   [facility] Adult PT Treatment:                                                DATE: 06/23/23 Therapeutic Exercise: Nustep L5 x 6 min while taking subjective Standing hip abd/ext 2x10 each Lateral step up x10 4in BIL Supine unilateral clamshell x10 BIL Supine QL stretch 2x30" BIL Supine march 2# 2x30" Supine figure 4 piriformis stretch 2x30" BIL Bridge 2x10   [facility] Adult PT Treatment:                                                DATE: 06/16/23 Therapeutic Exercise: Nustep L5 x 6 min while taking  subejctive Supine QL stretch 2x30" Lt Sidelying clamshell no resistance 2x10 Lt Supine march 2# 2x30" Supine figure 4 piriformis stretch 3x30" Lt Bridge 2x10 Supine SLR 2x10 each LAQ 2x10 2# STS 2x10 - arms crossed        [facility] Adult PT Treatment:  DATE: 06/09/23 Therapeutic Exercise: Nustep L5 x 4 min while taking subejctive Supine clamshell 2x10 YTB Supine march YTB 2x20 Bridge 3x10 Supine SLR 2x10 each LAQ 2x10 2# STS 2x10 - no UE  Standing hip abd/ext 2x10 each Lateral step up 2x10 4in L leading    PATIENT EDUCATION:  Education details: Discussed eval findings, rehab rationale and POC and patient is in agreement  Person educated: Patient Education method: Explanation Education comprehension: verbalized understanding and needs further education  HOME EXERCISE PROGRAM: Access Code: [email or web address] URL: [email or web address] Date: 05/19/2023 Prepared by: Gustavus Bryant  Exercises - Clamshell  - 1 x daily - 5 x weekly - 3 sets - 10 reps - Supine Piriformis Stretch with Foot on Ground  - 1 x daily - 5 x weekly - 1 sets - 2 reps - 30s hold  ASSESSMENT:  CLINICAL IMPRESSION: Re-assessed progress at 6 visit mark.  Overall pain severity unchanged and fluctuating.  Slight gains in 5x STS.  FOTO score improved.  Still TTP L piriformis.  Introduce TPDN to L piriformis which improved symptoms.  Recommend continue OPPT 1w4 and assess benefit of ongoing TPDN   EVAL- Patient is a 52 y.o. female who was seen today for physical therapy evaluation and treatment for chronic L hip pain. Patient presents with restricted hip IR, isolated gluteus medius weakness and positive FAI impingement signs.  Palpation finds TTP L piriformis.  OBJECTIVE IMPAIRMENTS: Abnormal gait, decreased activity tolerance, decreased knowledge of condition, difficulty walking, decreased ROM, decreased strength, improper body mechanics, postural dysfunction, and  pain.   ACTIVITY LIMITATIONS: carrying, lifting, bending, sitting, squatting, and stairs  PERSONAL FACTORS: Fitness, Past/current experiences, and Time since onset of injury/illness/exacerbation are also affecting patient's functional outcome.    GOALS: Goals reviewed with patient? No  SHORT TERM GOALS: Target date: 06/09/2023   Patient to demonstrate independence in HEP Baseline: [email or web address] Goal status: MET Pt reports adherence 06/23/23   LONG TERM GOALS: Target date: 06/30/2023    Decrease pain to 6/10 at worst Baseline: 10/10 worst pain; 06/30/23 8/10 Goal status: INITIAL  2.  Increase LLE strength to 4+/5 Baseline:  MMT Right eval Left eval  Hip flexion  4  Hip extension  4  Hip abduction  4-   Goal status: Ongoing  3.  Increase FOTO score to 45 Baseline: 31; 06/30/23 38 Goal status: Ongoing  4.  Minimize L piriformis tenderness Baseline: Moderate tenderness; 06/30/23 Minimal following TPDN Goal status: Ongoing    PLAN:  PT FREQUENCY: 1-2x/week  PT DURATION: 6 weeks  PLANNED INTERVENTIONS: Therapeutic exercises, Therapeutic activity, Neuromuscular re-education, Balance training, Gait training, Patient/Family education, Self Care, Joint mobilization, Stair training, Aquatic Therapy, Dry Needling, Electrical stimulation, Cryotherapy, Moist heat, Manual therapy, and Re-evaluation  PLAN FOR NEXT SESSION: HEP review and update, manual techniques as appropriate, aerobic tasks, ROM and flexibility activities, strengthening and PREs, TPDN, gait and balance training as needed     Hildred Laser, PT 07/06/2023, 9:56 AM

## 2023-07-07 ENCOUNTER — Ambulatory Visit: Payer: MEDICAID

## 2023-07-19 ENCOUNTER — Ambulatory Visit: Payer: MEDICAID

## 2023-07-20 ENCOUNTER — Other Ambulatory Visit: Payer: Self-pay

## 2023-07-20 ENCOUNTER — Encounter (HOSPITAL_COMMUNITY): Payer: Self-pay | Admitting: Emergency Medicine

## 2023-07-20 ENCOUNTER — Emergency Department (HOSPITAL_COMMUNITY)
Admission: EM | Admit: 2023-07-20 | Discharge: 2023-07-20 | Disposition: A | Discharge status: Home / Self Care | Payer: MEDICAID | Attending: Emergency Medicine | Admitting: Emergency Medicine

## 2023-07-20 DIAGNOSIS — L304 Erythema intertrigo: Secondary | ICD-10-CM | POA: Insufficient documentation

## 2023-07-20 DIAGNOSIS — R739 Hyperglycemia, unspecified: Secondary | ICD-10-CM

## 2023-07-20 DIAGNOSIS — E1165 Type 2 diabetes mellitus with hyperglycemia: Secondary | ICD-10-CM | POA: Insufficient documentation

## 2023-07-20 DIAGNOSIS — Z794 Long term (current) use of insulin: Secondary | ICD-10-CM | POA: Diagnosis not present

## 2023-07-20 DIAGNOSIS — R21 Rash and other nonspecific skin eruption: Secondary | ICD-10-CM | POA: Diagnosis present

## 2023-07-20 LAB — CBC
HCT: 40.7 % (ref 36.0–46.0)
Hemoglobin: 14.6 g/dL (ref 12.0–15.0)
MCH: 29.5 pg (ref 26.0–34.0)
MCHC: 35.9 g/dL (ref 30.0–36.0)
MCV: 82.2 fL (ref 80.0–100.0)
Platelets: 241 10*3/uL (ref 150–400)
RBC: 4.95 MIL/uL (ref 3.87–5.11)
RDW: 12.6 % (ref 11.5–15.5)
WBC: 8.6 10*3/uL (ref 4.0–10.5)
nRBC: 0 % (ref 0.0–0.2)

## 2023-07-20 LAB — URINALYSIS, ROUTINE W REFLEX MICROSCOPIC
Bacteria, UA: NONE SEEN
Bilirubin Urine: NEGATIVE
Glucose, UA: >=500 mg/dL — AB
Hgb urine dipstick: NEGATIVE
Ketones, ur: NEGATIVE mg/dL
Leukocytes,Ua: NEGATIVE
Nitrite: NEGATIVE
Protein, ur: NEGATIVE mg/dL
Specific Gravity, Urine: 1.03 (ref 1.005–1.030)
pH: 7 (ref 5.0–8.0)

## 2023-07-20 LAB — COMPREHENSIVE METABOLIC PANEL
ALT: 16 U/L (ref 0–44)
AST: 17 U/L (ref 15–41)
Albumin: 4.2 g/dL (ref 3.5–5.0)
Alkaline Phosphatase: 122 U/L (ref 38–126)
Anion gap: 11 (ref 5–15)
BUN: 22 mg/dL — ABNORMAL HIGH (ref 6–20)
CO2: 27 mmol/L (ref 22–32)
Calcium: 8.5 mg/dL — ABNORMAL LOW (ref 8.9–10.3)
Chloride: 91 mmol/L — ABNORMAL LOW (ref 98–111)
Creatinine, Ser: 1.18 mg/dL — ABNORMAL HIGH (ref 0.44–1.00)
GFR, Estimated: 55 mL/min — ABNORMAL LOW (ref >60)
Glucose, Bld: 675 mg/dL (ref 70–99)
Potassium: 4.4 mmol/L (ref 3.5–5.1)
Sodium: 129 mmol/L — ABNORMAL LOW (ref 135–145)
Total Bilirubin: 0.9 mg/dL (ref 0.3–1.2)
Total Protein: 7.7 g/dL (ref 6.5–8.1)

## 2023-07-20 LAB — CBG MONITORING, ED
Glucose-Capillary: 596 mg/dL (ref 70–99)
Glucose-Capillary: 446 mg/dL — ABNORMAL HIGH (ref 70–99)

## 2023-07-20 MED ORDER — CLOTRIMAZOLE 1 % EX CREA: TOPICAL_CREAM | CUTANEOUS | 0 refills | Status: AC

## 2023-07-20 MED ORDER — LACTATED RINGERS IV BOLUS
1000.0000 mL | Freq: Once | INTRAVENOUS | Status: AC
  Administered 2023-07-20: 1000 mL via INTRAVENOUS

## 2023-07-20 MED ORDER — INSULIN ASPART 100 UNIT/ML IJ SOLN
15.0000 [IU] | Freq: Once | INTRAMUSCULAR | Status: AC
  Administered 2023-07-20: 15 [IU] via SUBCUTANEOUS
  Filled 2023-07-20: qty 1

## 2023-07-20 NOTE — ED Notes (Signed)
[facility]G [id number]; Triage RN notified

## 2023-07-20 NOTE — ED Notes (Signed)
ED Provider at bedside. 

## 2023-07-20 NOTE — Discharge Instructions (Signed)
Call your endocrinologist tomorrow for further adjustments on your insulin dosing.  Use the antifungal cream under your breasts until your symptoms clear up.  Follow-up with your primary care physician.

## 2023-07-20 NOTE — ED Provider Notes (Signed)
[facility] DEPARTMENT AT [facility] Provider Note   CSN: [id number] Arrival date & time: 07/20/23  1750     History  Chief Complaint  Patient presents with   Hyperglycemia    Lacey Bradshaw is a 52 y.o. female.  HPI 52 year old female with a history of type 2 diabetes presents with elevated glucose and a rash under her breast.  Patient states that she received a shot of steroids and Toradol for her hip last week.  Ever since then her glucometer has been reading high.  She has been compliant with her insulin.  She denies any acute infection such as fevers, cough, shortness of breath.  No chest pain.  However last night she noticed she had a rash under both breasts.  It is itchy and burning.  Home Medications Prior to Admission medications   Medication Sig Start Date End Date Taking? Authorizing Provider  alprazolam Prudy Feeler) 2 MG tablet Take 2 mg by mouth as needed for anxiety or sleep.   Yes [provider]  celecoxib (CELEBREX) 200 MG capsule Take 200 mg by mouth 2 (two) times daily. 10/22/22  Yes [provider]  clotrimazole (LOTRIMIN) 1 % cream Apply to affected area 2 times daily 07/20/23  Yes Pricilla Loveless, MD  cyclobenzaprine (FLEXERIL) 5 MG tablet Take 1 tablet (5 mg total) by mouth at bedtime. 04/06/23  Yes Rice, Jamesetta Orleans, MD  gabapentin (NEURONTIN) 300 MG capsule Take 300 mg by mouth as needed. 08/25/22  Yes [provider]  hydrALAZINE (APRESOLINE) 25 MG tablet Take 25 mg by mouth daily. 06/18/22  Yes [provider]  HYDROcodone-acetaminophen (NORCO) 10-325 MG tablet Take 1 tablet by mouth as needed for severe pain. 10/27/22  Yes [provider]  insulin aspart (NOVOLOG FLEXPEN) 100 UNIT/ML FlexPen Inject 8-14 Units into the skin 3 (three) times daily with meals. 06/02/23  Yes Reardon, Freddi Starr, NP  insulin glargine (LANTUS SOLOSTAR) 100 UNIT/ML Solostar Pen Inject 30 Units into the skin at bedtime.  06/03/23  Yes Dani Gobble, NP  omeprazole (PRILOSEC) 40 MG capsule Take 1 tablet by mouth daily. 05/24/23  Yes [provider]  pregabalin (LYRICA) 100 MG capsule Take 100 mg by mouth 3 (three) times daily. 02/05/20  Yes [provider]  vortioxetine HBr (TRINTELLIX) 5 MG TABS tablet Take 5 mg by mouth daily. 09/29/22  Yes [provider]  BD VEO INSULIN SYRINGE U/F 31G X 15/64" 0.3 ML MISC ONE SYRINGE PER INSULIN USE 12/18/20   [provider]  Continuous Glucose Sensor (DEXCOM G7 SENSOR) MISC Inject 1 Application into the skin as directed. Change sensor every 10 days as directed. 06/02/23   Dani Gobble, NP      Allergies    Dapagliflozin, Dulaglutide, and Liraglutide    Review of Systems   Review of Systems  Constitutional:  Negative for fever.  Respiratory:  Negative for cough and shortness of breath.   Cardiovascular:  Negative for chest pain.  Skin:  Positive for rash.    Physical Exam Updated Vital Signs BP (!) 149/82   Pulse 93   Temp 99.3 F (37.4 C) (Oral)   Resp 17   Ht 5\' 3"  (1.6 m)   Wt 90.7 kg   SpO2 96%   BMI 35.43 kg/m  Physical Exam Vitals and nursing note reviewed. Exam conducted with a chaperone present.  Constitutional:      Appearance: She is well-developed.  HENT:     Head: Normocephalic  and atraumatic.  Cardiovascular:     Rate and Rhythm: Normal rate and regular rhythm.     Heart sounds: Normal heart sounds.  Pulmonary:     Effort: Pulmonary effort is normal.     Breath sounds: Normal breath sounds.  Chest:     Comments: Bilateral mild intertrigo under each breast.  No obvious cellulitis. Abdominal:     Palpations: Abdomen is soft.     Tenderness: There is no abdominal tenderness.  Skin:    General: Skin is warm and dry.  Neurological:     Mental Status: She is alert.     ED Results / Procedures / Treatments   Labs (all labs ordered are listed, but only abnormal results are displayed) Labs  Reviewed  URINALYSIS, ROUTINE W REFLEX MICROSCOPIC - Abnormal; Notable for the following components:      Result Value   Color, Urine STRAW (*)    Glucose, UA >=500 (*)    All other components within normal limits  COMPREHENSIVE METABOLIC PANEL - Abnormal; Notable for the following components:   Sodium 129 (*)    Chloride 91 (*)    Glucose, Bld 675 (*)    BUN 22 (*)    Creatinine, Ser 1.18 (*)    Calcium 8.5 (*)    GFR, Estimated 55 (*)    All other components within normal limits  CBG MONITORING, ED - Abnormal; Notable for the following components:   Glucose-Capillary 596 (*)    All other components within normal limits  CBG MONITORING, ED - Abnormal; Notable for the following components:   Glucose-Capillary 446 (*)    All other components within normal limits  CBC  CBG MONITORING, ED    EKG None  Radiology No results found.  Procedures Procedures    Medications Ordered in ED Medications  insulin aspart (novoLOG) injection 15 Units (15 Units Subcutaneous Given 07/20/23 2044)  lactated ringers bolus 1,000 mL (1,000 mLs Intravenous New Bag/Given 07/20/23 2055)  lactated ringers bolus 1,000 mL (1,000 mLs Intravenous New Bag/Given 07/20/23 2054)    ED Course/ Medical Decision Making/ A&P                                 Medical Decision Making Amount and/or Complexity of Data Reviewed Labs: ordered.    Details: Hyperglycemia but no DKA.  No ketones in the urine  Risk Prescription drug management.   Patient's physical exam is consistent with intertrigo under her breasts.  These will be treated with antifungal cream.  Otherwise, she is hyperglycemic but this has come down significantly with some fluids and insulin subcutaneously.  No indication of DKA on workup.  Likely this is acute on chronic due to her recent steroid injection.  At this point, I do not think she needs further workup or admission has been given some fluids and insulin.  Discussed she should follow-up  with her endocrinologist to discuss further outpatient insulin management.  Appears stable for discharge home with return precautions.        Final Clinical Impression(s) / ED Diagnoses Final diagnoses:  Hyperglycemia  Intertrigo    Rx / DC Orders ED Discharge Orders          Ordered    clotrimazole (LOTRIMIN) 1 % cream        07/20/23 2200              Pricilla Loveless, MD 07/20/23 2210

## 2023-07-20 NOTE — ED Triage Notes (Signed)
Pt comes lightheaded increased urination and increased thurst states meter is reading high. Pt had a steriod injection last week for hip and thinks that caused blood glucose to go up. Also complains of a rash under breast.

## 2023-07-21 ENCOUNTER — Telehealth: Payer: Self-pay | Admitting: *Deleted

## 2023-07-21 NOTE — Telephone Encounter (Signed)
Patient had a recent shot of steroid in the pain in her hip. All week her blood sugars have been running over 500. Not currently using the Omnipod as she needs a transmitter. Patient states that the Omnipod is the best for her. She is requesting that the transmitter be called to the [facility] [place].

## 2023-07-22 ENCOUNTER — Telehealth (HOSPITAL_COMMUNITY): Payer: Self-pay

## 2023-07-22 ENCOUNTER — Encounter (HOSPITAL_COMMUNITY): Payer: Self-pay

## 2023-07-22 MED ORDER — DEXCOM G6 SENSOR MISC: 3 refills | Status: DC

## 2023-07-22 MED ORDER — DEXCOM G6 TRANSMITTER MISC
3 refills | Status: DC
Start: 2023-07-22 — End: 2023-12-14

## 2023-07-22 NOTE — Telephone Encounter (Signed)
I had sent in for the Dexcom G7 back in July.  Did she never get that one?  I think she had been using the G6 previously but the G7 eliminates the need for a transmitter.

## 2023-07-22 NOTE — Telephone Encounter (Signed)
Called to confirm 07/26/23 appt no answer vm not set up mychart message sent to confirm

## 2023-07-22 NOTE — Telephone Encounter (Signed)
Patient was called and made aware. 

## 2023-07-22 NOTE — Telephone Encounter (Signed)
I've sent both the G6 sensors and the G6 transmitter in to her pharmacy.  I agree, that using the Omnipod will help control her glucose a bit better.  In the meantime have her increase her Lantus to 40 units at night.

## 2023-07-22 NOTE — Telephone Encounter (Signed)
Talked with Ms. Lacey Bradshaw. She did get the Dexcom G7. She shares that she has used so many sensors trying to get them to stay on. She has used the sticky pads to get them to stay on.  She feels that it would be best for her to do the Dexcom G6 with the transmitter so that she can get back on the Omnipod.  Pt called with high BG readings.   Date Before breakfast Before lunch Before supper Bedtime  07/19/23 High  Over 500 Over 500  07/20/23 High  High High  07/21/23 High [phone]   07/22/23 Has not taken BG yet       Pt taking: She is injecting Novolog 8-14 units ac meals, Lantus 30 units at bedtime.Per the patient, the above readings are finger sticks.

## 2023-07-23 NOTE — Telephone Encounter (Signed)
Called no answer no vm cancelled appt

## 2023-07-23 NOTE — Telephone Encounter (Signed)
Called no answer - no vm set up

## 2023-07-26 ENCOUNTER — Ambulatory Visit (HOSPITAL_COMMUNITY): Payer: MEDICAID | Admitting: Psychiatry

## 2023-07-26 ENCOUNTER — Encounter (HOSPITAL_COMMUNITY): Payer: Self-pay | Admitting: Psychiatry

## 2023-07-26 ENCOUNTER — Ambulatory Visit (INDEPENDENT_AMBULATORY_CARE_PROVIDER_SITE_OTHER): Payer: MEDICAID | Admitting: Psychiatry

## 2023-07-26 DIAGNOSIS — F331 Major depressive disorder, recurrent, moderate: Secondary | ICD-10-CM

## 2023-08-02 ENCOUNTER — Encounter (HOSPITAL_COMMUNITY): Payer: Self-pay | Admitting: Psychiatry
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
## 2023-08-17 ENCOUNTER — Telehealth: Payer: Self-pay | Admitting: Nurse Practitioner

## 2023-08-17 NOTE — Telephone Encounter (Signed)
Sent records request received by mail on 08/17/23 to [facility] at [phone] and then sent release to scan.

## 2023-09-08 ENCOUNTER — Ambulatory Visit: Payer: MEDICAID | Admitting: Nurse Practitioner

## 2023-09-08 DIAGNOSIS — Z794 Long term (current) use of insulin: Secondary | ICD-10-CM

## 2023-09-08 DIAGNOSIS — I10 Essential (primary) hypertension: Secondary | ICD-10-CM

## 2023-09-08 DIAGNOSIS — E782 Mixed hyperlipidemia: Secondary | ICD-10-CM

## 2023-09-08 DIAGNOSIS — E1165 Type 2 diabetes mellitus with hyperglycemia: Secondary | ICD-10-CM

## 2023-10-11 ENCOUNTER — Ambulatory Visit (INDEPENDENT_AMBULATORY_CARE_PROVIDER_SITE_OTHER): Payer: MEDICAID | Admitting: Psychiatry

## 2023-10-11 ENCOUNTER — Encounter (HOSPITAL_COMMUNITY): Payer: Self-pay

## 2023-10-11 DIAGNOSIS — F331 Major depressive disorder, recurrent, moderate: Secondary | ICD-10-CM

## 2023-10-11 NOTE — Progress Notes (Signed)
IN-PERSON  THERAPIST PROGRESS NOTE  Session Time: Monday 10/11/2023 1:05 PM -2:00 PM   Participation Level: Active  Behavioral Response: CasualAlertAnxious and Depressed  Type of Therapy: Individual Therapy  Treatment Goals addressed: [name] will score less than 5 on the Generalized Anxiety Disorder 7 Scale (GAD-7   Lean and implement 3 relaxation techniques, practice a technique daily   ProgressTowards Goals: Initial  Interventions: CBT and Supportive  Summary: Lacey Bradshaw is a 52 y.o. female who is   is referred for services by PCP. She reports one psychiatric hospitalization in 2022 in [place] due to suicide attempt and depression.Pt reports participating in outpatient therapy in [place] and last was seen in 2017.Pt reports initially beginning to have problems with depression in 2017 when she was laid off her job.  She also began to have problems with anxiety.  Patient states often thinking about the past and says she is an over thinker.  She states difficulty sleeping and being snappy with people.  Other symptoms include poor concentration, fatigue, hopelessness, irritability muscle tension, restlessness, and worrying.  Stressors include concerns about her daughter who has overcome drug addiction and being clean for almost 3 years.  Patient reports fear of daughter relapsing.  Patient reports additional stress regarding her situation as she does not have assisted in the source of income.  Patient has applied for disability and has been denied twice.   Patient last was seen about 2-1/2 months ago for the assessment appointment.  She continues to is experience symptoms of anxiety and depression as reflected in the GAD-7 and the PHQ 2 and 9.  Patient reports constantly worrying and often feeling overwhelmed.  She is particularly concerned about her daughter who also is very stressed.  Patient reports daughter often calls her about her problems.  Patient fears daughter relapsing.   Patient also reports continued worry about her own financial situation.  She is in the process of working with an attorney to apply for SSI as she has been denied twice.  Patient reports feeling down and experiencing negative thoughts about self as she does not have a job.  Patient reports she has always worked and is sad she is unable to work now.  Patient also reports stress related to chronic pain as a result of having arthritis and fibromyalgia.  Patient also reports significant sleep difficulty.  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 as well as trying to solve other people's problems Suicidal/Homicidal: Nowithout intent/plan  Therapist Response: Reviewed symptoms, administered PHQ 2 and 9 and GAD-7, discussed results, gather more information from patient, discussed stressors, facilitated expression of thoughts and feelings, validated feelings, develop treatment plan, sent patient signature page and copy of treatment plan via MyChart, also provided patient with paper copy, provided patient with handout on ways to improve sleep and asked patient to review in preparation for next session, began to discuss next steps for treatment  Plan: Return again in 2 weeks.  Diagnosis: Major depressive disorder, recurrent episode, moderate (HCC)  Collaboration of Care: Primary Care Provider AEB patient works with PCP regarding medication management  Patient/Guardian was advised Release of Information must be obtained prior to any record release in order to collaborate their care with an outside provider. Patient/Guardian was advised if they have not already done so to contact the registration department to sign all necessary forms in order for [place] to release information regarding their care.   Consent: Patient/Guardian gives verbal consent for treatment and assignment of benefits for services  provided during this visit. Patient/Guardian expressed understanding and agreed to proceed.   Adah Salvage,  LCSW 10/11/2023

## 2023-10-25 ENCOUNTER — Ambulatory Visit (INDEPENDENT_AMBULATORY_CARE_PROVIDER_SITE_OTHER): Payer: MEDICAID | Admitting: Psychiatry

## 2023-10-25 DIAGNOSIS — F331 Major depressive disorder, recurrent, moderate: Secondary | ICD-10-CM

## 2023-10-25 NOTE — Progress Notes (Signed)
IN-PERSON  THERAPIST PROGRESS NOTE  Session Time: Monday 10/25/2023 1:05 PM - 1:59 PM   Participation Level: Active  Behavioral Response: CasualAlertAnxious and Depressed  Type of Therapy: Individual Therapy  Treatment Goals addressed: [name] will score less than 5 on the Generalized Anxiety Disorder 7 Scale (GAD-7   Lean and implement 3 relaxation techniques, practice a technique daily   ProgressTowards Goals: not progressing   Interventions: CBT and Supportive  Summary: Lacey Bradshaw is a 52 y.o. female who is   is referred for services by PCP. She reports one psychiatric hospitalization in 2022 in [place] due to suicide attempt and depression.Pt reports participating in outpatient therapy in [place] and last was seen in 2017.Pt reports initially beginning to have problems with depression in 2017 when she was laid off her job.  She also began to have problems with anxiety.  Patient states often thinking about the past and says she is an over thinker.  She states difficulty sleeping and being snappy with people.  Other symptoms include poor concentration, fatigue, hopelessness, irritability muscle tension, restlessness, and worrying.  Stressors include concerns about her daughter who has overcome drug addiction and being clean for almost 3 years.  Patient reports fear of daughter relapsing.  Patient reports additional stress regarding her situation as she does not have assisted in the source of income.  Patient has applied for disability and has been denied twice.   Patient last was seen about 2 weeks ago.  She continues to is experience symptoms of anxiety and depression as reflected in the GAD-7 and the PHQ 2 and 9.  Stressors continue to include worry about her daughter and.  She also expresses frustration as she states feeling used by daughter who continues to call her only when she needs something.  Patient also reports increased stress, sadness, and anxiety in the past 2 weeks  triggered by the holidays.  Patient expresses frustration and thoughts of helplessness and worthlessness due to to financial issues.  She also expresses frustration as her siblings expect her to do most of the preparation for her family get-togethers since she resides with their father.   Suicidal/Homicidal: Nowithout intent/plan  Therapist Response: Reviewed symptoms, administered PHQ 2 and 9 and GAD-7, discussed results, gather more information from patient, discussed stressors, facilitated expression of thoughts and feelings, validated feelings, assisted patient began to identify her interactions with daughter son, as and other family members and to assist patient identify difficulties being assertive, assisted  patient identify/challenge/and replace negative thoughts with more rational thoughts provided psychoeducation on anxiety and stress response discussed rationale for and assisted patient practicing deep breathing to trigger relaxation response, develop plan patient to practice deep breathing 5 to 10 minutes, also discussed possible referral n the future for medication evaluation, also discussed issues and normalized feelings related to grief and loss triggered by the holidays  Plan: Return again in 2 weeks.  Diagnosis: Major depressive disorder, recurrent episode, moderate (HCC)  Collaboration of Care: Primary Care Provider AEB patient works with PCP regarding medication management  Patient/Guardian was advised Release of Information must be obtained prior to any record release in order to collaborate their care with an outside provider. Patient/Guardian was advised if they have not already done so to contact the registration department to sign all necessary forms in order for [place] to release information regarding their care.   Consent: Patient/Guardian gives verbal consent for treatment and assignment of benefits for services provided during this visit. Patient/Guardian expressed understanding  and  agreed to proceed.   Adah Salvage, LCSW 10/25/2023

## 2023-11-08 ENCOUNTER — Ambulatory Visit (INDEPENDENT_AMBULATORY_CARE_PROVIDER_SITE_OTHER): Payer: MEDICAID | Admitting: Psychiatry

## 2023-11-08 DIAGNOSIS — F331 Major depressive disorder, recurrent, moderate: Secondary | ICD-10-CM | POA: Diagnosis not present

## 2023-11-08 NOTE — Progress Notes (Signed)
IN-PERSON  THERAPIST PROGRESS NOTE  Session Time: Monday 11/08/2023 1:05 PM -  1:56  PM   Participation Level: Active  Behavioral Response: CasualAlertAnxious and Depressed  Type of Therapy: Individual Therapy  Treatment Goals addressed: [name] will score less than 5 on the Generalized Anxiety Disorder 7 Scale (GAD-7   Lean and implement 3 relaxation techniques, practice a technique daily   ProgressTowards Goals: not progressing   Interventions: CBT and Supportive  Summary: Lacey Bradshaw is a 52 y.o. female who is   is referred for services by PCP. She reports one psychiatric hospitalization in 2022 in [place] due to suicide attempt and depression.Pt reports participating in outpatient therapy in [place] and last was seen in 2017.Pt reports initially beginning to have problems with depression in 2017 when she was laid off her job.  She also began to have problems with anxiety.  Patient states often thinking about the past and says she is an over thinker.  She states difficulty sleeping and being snappy with people.  Other symptoms include poor concentration, fatigue, hopelessness, irritability muscle tension, restlessness, and worrying.  Stressors include concerns about her daughter who has overcome drug addiction and being clean for almost 3 years.  Patient reports fear of daughter relapsing.  Patient reports additional stress regarding her situation as she does not have assisted in the source of income.  Patient has applied for disability and has been denied twice.   Patient last was seen about 2 weeks ago.  She continues to is experience symptoms of anxiety and depression as reflected in the GAD-7 and the PHQ 2 and 9.  She reports increased stress due to father's increased health issues and recent hospitalization. He now is home but is very irritable and argumentative per pt's report. She suspects he may have beginning stages of Alzheimer's and plans to express her concerns to his  PCP. She also continues to express frustration regarding her sibling's expectations and comments. She reports additional stress regarding recent conflict with boyfriend and trust issues. Pt reports she has used deep breathing as an intervention when she became upset and reports this his helpful. However, she has not been using consistently, Suicidal/Homicidal: Nowithout intent/plan  Therapist Response: Reviewed symptoms, administered PHQ 2 and 9 and GAD-7, discussed results, encouraged patient to follow through with her plans to express concerns regarding her father to PCP, praised and reinforced patient's efforts to keep use deep breathing as an intervention, discussed effects, reviewed rationale for practicing deep breathing or another relaxation technique consistently rather than just as an intervention, discussed rationale for and provided patient with instructions on how to use beach visualization as a relaxation technique, developed plan with patient to practice, checked out interactive audio activity to patient and provided with access code to assist her in her efforts, facilitated patient expressing thoughts and feelings regarding relationship with her boyfriend, validated feelings, assisted patient identify pros and cons regarding patient expressing her concerns and her feelings to her boyfriend, assisted patient identify how she would handle it if her worst fears came true regarding the relationship   Plan: Return again in 2 weeks.  Diagnosis: Major depressive disorder, recurrent episode, moderate (HCC)  Collaboration of Care: Primary Care Provider AEB patient works with PCP regarding medication management  Patient/Guardian was advised Release of Information must be obtained prior to any record release in order to collaborate their care with an outside provider. Patient/Guardian was advised if they have not already done so to contact the registration department to sign  all necessary forms in  order for [place] to release information regarding their care.   Consent: Patient/Guardian gives verbal consent for treatment and assignment of benefits for services provided during this visit. Patient/Guardian expressed understanding and agreed to proceed.   Adah Salvage, LCSW 11/08/2023

## 2023-11-22 ENCOUNTER — Ambulatory Visit (INDEPENDENT_AMBULATORY_CARE_PROVIDER_SITE_OTHER): Payer: MEDICAID | Admitting: Psychiatry

## 2023-11-22 DIAGNOSIS — F331 Major depressive disorder, recurrent, moderate: Secondary | ICD-10-CM

## 2023-11-22 NOTE — Progress Notes (Signed)
 IN-PERSON  THERAPIST PROGRESS NOTE  Session Time: Monday 11/22/2023 1:10 PM - 1:54 PM           Participation Level: Active  Behavioral Response: CasualAlertAnxious and Depressed  Type of Therapy: Individual Therapy  Treatment Goals addressed: Lacey Bradshaw  will score less than 5 on the Generalized Anxiety Disorder 7 Scale (GAD-7   Lean and implement 3 relaxation techniques, practice a technique daily   ProgressTowards Goals: not progressing   Interventions: CBT and Supportive  Summary: Lacey  Bradshaw is a 53 y.o. female who is   is referred for services by PCP. She reports one psychiatric hospitalization in 2022 in [place] due to suicide attempt and depression.Pt reports participating in outpatient therapy in [place] and last was seen in 2017.Pt reports initially beginning to have problems with depression in 2017 when she was laid off her job.  She also began to have problems with anxiety.  Patient states often thinking about the past and says she is an over thinker.  She states difficulty sleeping and being snappy with people.  Other symptoms include poor concentration, fatigue, hopelessness, irritability muscle tension, restlessness, and worrying.  Stressors include concerns about her daughter who has overcome drug addiction and being clean for almost 3 years.  Patient reports fear of daughter relapsing.  Patient reports additional stress regarding her situation as she does not have assisted in the source of income.  Patient has applied for disability and has been denied twice.   Patient last was seen about 2 weeks ago.  She continues to is experience symptoms of anxiety and depression.  She reports increased stress and anxiety as father has had increased health issues. Pt reports father currently is at the ED. Another family member is sitting with him while pt is in this appt. She also continues to experience financial stress and worries she may lose some of her belongings as she can't  afford to pay her storage bill. She reports additional stress related to recent break-up with boyfriend. She reports some relief as there were trust issues in their relationship. However, she already agreed to participate in a business venture with him and his mother that could possibly be helpful.  She expresses ambivalent feelings about possibly doing this.  Patient reports continuing to obsess about her past and having negative thoughts about self and the possibility of dating. Pt reports losing access code for visualization.   Suicidal/Homicidal: Nowithout intent/plan  Therapist Response: Reviewed symptoms, discussed stressors, facilitated expression of thoughts and feelings, validated feelings, discussed members in her family that could possibly assist patient lives with father, assisted patient began to examine her thought patterns and the connection between thoughts/mood/behavior, facilitated patient expressing thoughts and feelings regarding possible continued contact with her ex-boyfriend and his mother, developed plan with patient to do a pros and cons list, reviewed rationale for practicing beach visualization, checked out interactive audio activity and provided patient with access code  Plan: Return again in 2 weeks.  Diagnosis: Major depressive disorder, recurrent episode, moderate (HCC)  Collaboration of Care: Primary Care Provider AEB patient works with PCP regarding medication management  Patient/Guardian was advised Release of Information must be obtained prior to any record release in order to collaborate their care with an outside provider. Patient/Guardian was advised if they have not already done so to contact the registration department to sign all necessary forms in order for us  to release information regarding their care.   Consent: Patient/Guardian gives verbal consent for treatment and assignment of benefits for  services provided during this visit. Patient/Guardian expressed  understanding and agreed to proceed.   Winton FORBES Rubinstein, LCSW 11/22/2023

## 2023-12-13 ENCOUNTER — Ambulatory Visit (INDEPENDENT_AMBULATORY_CARE_PROVIDER_SITE_OTHER): Payer: MEDICAID | Admitting: Psychiatry

## 2023-12-13 DIAGNOSIS — F331 Major depressive disorder, recurrent, moderate: Secondary | ICD-10-CM

## 2023-12-13 NOTE — Progress Notes (Signed)
Virtual Visit via Telephone Note  I connected with [name] on 12/13/23 at 9:14 AM EST  by telephone and verified that I am speaking with the correct person using two identifiers.  Location: Patient: Home Provider: [facility]    I discussed the limitations, risks, security and privacy concerns of performing an evaluation and management service by telephone and the availability of in person appointments. I also discussed with the patient that there may be a patient responsible charge related to this service. The patient expressed understanding and agreed to proceed.    I provided 46 minutes of non-face-to-face time during this encounter.   Lacey Salvage, LCSW IN-PERSON  THERAPIST PROGRESS NOTE  Session Time: Monday 12/13/2023 9:14 AM  - 10:00 AM        Participation Level: Active  Behavioral Response: CasualAlertAnxious and Depressed  Type of Therapy: Individual Therapy  Treatment Goals addressed: [name] will score less than 5 on the Generalized Anxiety Disorder 7 Scale (GAD-7   Lean and implement 3 relaxation techniques, practice a technique daily   ProgressTowards Goals: not progressing   Interventions: CBT and Supportive  Summary: Lacey Bradshaw is a 53 y.o. female who is   is referred for services by PCP. She reports one psychiatric hospitalization in 2022 in [place] due to suicide attempt and depression.Pt reports participating in outpatient therapy in [place] and last was seen in 2017.Pt reports initially beginning to have problems with depression in 2017 when she was laid off her job.  She also began to have problems with anxiety.  Patient states often thinking about the past and says she is an over thinker.  She states difficulty sleeping and being snappy with people.  Other symptoms include poor concentration, fatigue, hopelessness, irritability muscle tension, restlessness, and worrying.  Stressors include concerns about her daughter who  has overcome drug addiction and being clean for almost 3 years.  Patient reports fear of daughter relapsing.  Patient reports additional stress regarding her situation as she does not have assisted in the source of income.  Patient has applied for disability and has been denied twice.   Patient last was seen about 2 -3  weeks ago.  She continues to is experience symptoms of anxiety and depression.  She reports continued stress and anxiety as father has continued health issues. She also has experienced increased health issues and recently went to ED due to suffering from migraines. She expresses continued frustration as she is receiving little to nor support from her nine siblings regarding providing care for their father. She reports additional stress due to recent conflict with two family members. Pt reports using some of the relaxation techniques but states just does not seem to be working right now as her mind is racing. She recently took melatonin ad says it helped improve her sleep pattern from 4 hours to about 6-7 hours.  Suicidal/Homicidal: Nowithout intent/plan  Therapist Response: Reviewed symptoms, discussed stressors, facilitated expression of thoughts and feelings, validated feelings, assisted pt examine her interaction with family and identify realisitc expectations, assisted pt identify ways to improve assertiveness skills to express concerns to and make requests to  family, discussed personal rights to promote more effective assertion, discussed scheduling time for self, encouraged pt to follow up with PCP regarding medication, encouraged pt to try to continue practicing relaxation techniques, Plan: Return again in 2 weeks.  Diagnosis: Major depressive disorder, recurrent episode, moderate (HCC)  Collaboration of Care: Primary Care Provider AEB patient works with PCP  regarding medication management  Patient/Guardian was advised Release of Information must be obtained prior to any record  release in order to collaborate their care with an outside provider. Patient/Guardian was advised if they have not already done so to contact the registration department to sign all necessary forms in order for [place] to release information regarding their care.   Consent: Patient/Guardian gives verbal consent for treatment and assignment of benefits for services provided during this visit. Patient/Guardian expressed understanding and agreed to proceed.   Lacey Salvage, LCSW 12/13/2023

## 2023-12-14 ENCOUNTER — Encounter: Payer: Self-pay | Admitting: Nurse Practitioner

## 2023-12-14 ENCOUNTER — Ambulatory Visit (INDEPENDENT_AMBULATORY_CARE_PROVIDER_SITE_OTHER): Payer: MEDICAID | Admitting: Nurse Practitioner

## 2023-12-14 VITALS — BP 114/70 | HR 76 | Ht 63.0 in | Wt 197.8 lb

## 2023-12-14 DIAGNOSIS — I10 Essential (primary) hypertension: Secondary | ICD-10-CM

## 2023-12-14 DIAGNOSIS — E1165 Type 2 diabetes mellitus with hyperglycemia: Secondary | ICD-10-CM

## 2023-12-14 DIAGNOSIS — E782 Mixed hyperlipidemia: Secondary | ICD-10-CM | POA: Diagnosis not present

## 2023-12-14 DIAGNOSIS — Z794 Long term (current) use of insulin: Secondary | ICD-10-CM | POA: Diagnosis not present

## 2023-12-14 MED ORDER — INSULIN ASPART 100 UNIT/ML IJ SOLN: INTRAMUSCULAR | 11 refills | Status: DC

## 2023-12-14 MED ORDER — DEXCOM G7 SENSOR MISC: 1.0000 | 3 refills | Status: DC

## 2023-12-14 NOTE — Progress Notes (Signed)
 Endocrinology Follow Up Note       12/14/2023, 3:20 PM   Subjective:    Patient ID: Lacey  Bradshaw, female    DOB: 1971/10/09.  Lacey  Bradshaw is being seen in follow up after being seen in consultation for management of currently uncontrolled symptomatic diabetes requested by  Lacey Norleen PEDLAR, MD.   Past Medical History:  Diagnosis Date   Acid reflux    Bulging of lumbar intervertebral disc    Diabetes mellitus without complication (HCC)    borderline   Diabetes mellitus, type II (HCC)    Gastroparesis    Hyperlipidemia    Hyposomnia, insomnia or sleeplessness associated with anxiety    Migraine     Past Surgical History:  Procedure Laterality Date   ABDOMINAL HYSTERECTOMY     2009   CARPAL TUNNEL RELEASE Left    CATARACT EXTRACTION Bilateral 03/2022   FRACTURE SURGERY     jaw   TUBAL LIGATION      Social History   Socioeconomic History   Marital status: Single    Spouse name: Not on file   Number of children: Not on file   Years of education: Not on file   Highest education level: Not on file  Occupational History   Not on file  Tobacco Use   Smoking status: Never    Passive exposure: Past   Smokeless tobacco: Never  Vaping Use   Vaping status: Never Used  Substance and Sexual Activity   Alcohol use: No   Drug use: No   Sexual activity: Not Currently  Other Topics Concern   Not on file  Social History Narrative   Not on file   Social Drivers of Health   Financial Resource Strain: Medium Risk (06/15/2022)   Received from [facility], [facility]   Overall Financial Resource Strain (CARDIA)    Difficulty of Paying Living Expenses: Somewhat hard  Food Insecurity: Food Insecurity Present (06/15/2022)   Received from [facility], [facility]   Hunger Vital Sign    Worried About Running Out of Food in the Last Year: Never true    Ran Out of Food in the Last Year: Sometimes true   Transportation Needs: No Transportation Needs (06/15/2022)   [facility]   PRAPARE - Transportation    Lack of Transportation (Medical): No    Lack of Transportation (Non-Medical): No  Physical Activity: Insufficiently Active (06/15/2022)   Received from [facility], [facility]   Exercise Vital Sign    Days of Exercise per Week: 2 days    Minutes of Exercise per Session: 30 min  Stress: Stress Concern Present (06/15/2022)   Received from [facility], [facility] Occupational Health - Occupational Stress Questionnaire    Feeling of Stress : Very much  Social Connections: Unknown (06/21/2023)   Received from [facility]   Social Network    Social Network: Not on file    Family History  Problem Relation Age of Onset   Cancer Mother    Congestive Heart Failure Father    Hypertension Father    High Cholesterol Father    Stroke Father  Heart attack Father    Diabetes Sister    COPD Sister    Congestive Heart Failure Sister    Arthritis Sister    Diabetes Sister    Diabetes Sister    Depression Sister    Diabetes Sister    Diabetes Sister    Diabetes Sister    Diabetes Sister    Depression Brother    Hypertension Brother    Diabetes Brother    Healthy Daughter     Outpatient Encounter Medications as of 12/14/2023  Medication Sig   alprazolam (XANAX) 2 MG tablet Take 2 mg by mouth as needed for anxiety or sleep.   atorvastatin (LIPITOR) 80 MG tablet Take 80 mg by mouth daily.   BD VEO INSULIN  SYRINGE U/F 31G X 15/64 0.3 ML MISC ONE SYRINGE PER INSULIN  USE   celecoxib (CELEBREX) 200 MG capsule Take 200 mg by mouth 2 (two) times daily.   clotrimazole  (LOTRIMIN ) 1 % cream Apply to affected area 2 times daily   Continuous Glucose Sensor (DEXCOM G7 SENSOR) MISC Inject 1 Application into the skin as directed. Change sensor every 10 days as directed.   cyclobenzaprine  (FLEXERIL ) 5 MG tablet Take 1 tablet (5 mg total)  by mouth at bedtime.   gabapentin (NEURONTIN) 300 MG capsule Take 300 mg by mouth as needed.   hydrALAZINE (APRESOLINE) 25 MG tablet Take 25 mg by mouth daily.   HYDROcodone-acetaminophen  (NORCO) 10-325 MG tablet Take 1 tablet by mouth as needed for severe pain.   insulin  aspart (NOVOLOG ) 100 UNIT/ML injection Use with Omnipod for TDD around 60 units   insulin  glargine (LANTUS  SOLOSTAR) 100 UNIT/ML Solostar Pen Inject 30 Units into the skin at bedtime.   omeprazole (PRILOSEC) 40 MG capsule Take 1 tablet by mouth daily.   pregabalin (LYRICA) 100 MG capsule Take 100 mg by mouth 3 (three) times daily.   [DISCONTINUED] insulin  aspart (NOVOLOG  FLEXPEN) 100 UNIT/ML FlexPen Inject 8-14 Units into the skin 3 (three) times daily with meals.   vortioxetine HBr (TRINTELLIX) 5 MG TABS tablet Take 5 mg by mouth daily. (Patient not taking: Reported on 07/26/2023)   [DISCONTINUED] Continuous Glucose Sensor (DEXCOM G6 SENSOR) MISC Change sensor every 10 days as directed (Patient not taking: Reported on 12/14/2023)   [DISCONTINUED] Continuous Glucose Transmitter (DEXCOM G6 TRANSMITTER) MISC Change transmitter every 90 days as directed. (Patient not taking: Reported on 12/14/2023)   No facility-administered encounter medications on file as of 12/14/2023.    ALLERGIES: Allergies  Allergen Reactions   Dapagliflozin Other (See Comments)    Nausea and vomiting Pt states she is not allergic   Dulaglutide     Other reaction(s): Makes her feel terrible.   Liraglutide     Other reaction(s): burping & belching    VACCINATION STATUS: Immunization History  Administered Date(s) Administered   Zoster Recombinant(Shingrix) 03/10/2022    Diabetes She presents for her follow-up diabetic visit. She has type 2 diabetes mellitus. Onset time: diagnosed at approx age of 45. Her disease course has been worsening. There are no hypoglycemic associated symptoms. Associated symptoms include blurred vision, fatigue, foot  paresthesias, polydipsia, polyphagia, polyuria, visual change and weight loss. There are no hypoglycemic complications. Symptoms are stable. Diabetic complications include peripheral neuropathy and retinopathy. Risk factors for coronary artery disease include diabetes mellitus, dyslipidemia, hypertension, obesity, sedentary lifestyle and stress. Current diabetic treatment includes insulin  pump. She is compliant with treatment some of the time. Her weight is fluctuating minimally. She is following a generally unhealthy diet.  Meal planning includes avoidance of concentrated sweets. She has had a previous visit with a dietitian. She participates in exercise three times a week. Her home blood glucose trend is increasing rapidly. Her overall blood glucose range is >200 mg/dl. (She presents today with her logs showing inconsistent glucose monitoring and gross hyperglycemia overall.  Her most recent A1c on 11/25 was 11.5%, increasing from last visit of 9.5%.  She notes she is under great amount of stress, caring for her father.  She sent a message between visits that she wanted to start using her Omnipod again as glucose was better during that time.  She has been using it but has not been using CGM to check glucose, says she had several sensors that were defective.  Upon review of her pump, she has only been relying on basal rate to control glucose, not performing boluses at all.) An ACE inhibitor/angiotensin II receptor blocker is being taken. She does not see a podiatrist.Eye exam is current (has appt for f/u next week).  Hyperlipidemia This is a chronic problem. The current episode started more than 1 year ago. The problem is uncontrolled. Recent lipid tests were reviewed and are high. Exacerbating diseases include chronic renal disease, diabetes and obesity. Factors aggravating her hyperlipidemia include fatty foods. Current antihyperlipidemic treatment includes statins. The current treatment provides mild  improvement of lipids. Compliance problems include adherence to exercise and adherence to diet.  Risk factors for coronary artery disease include diabetes mellitus, dyslipidemia, hypertension, obesity and stress.  Hypertension This is a chronic problem. The current episode started more than 1 year ago. The problem has been resolved since onset. The problem is controlled. Associated symptoms include blurred vision. There are no associated agents to hypertension. Risk factors for coronary artery disease include diabetes mellitus, dyslipidemia, stress and obesity. Past treatments include ACE inhibitors. The current treatment provides significant improvement. There are no compliance problems.  Hypertensive end-organ damage includes kidney disease and retinopathy. Identifiable causes of hypertension include chronic renal disease.     Review of systems  Constitutional: + decreasing body weight,  current Body mass index is 35.04 kg/m. , + fatigue, no subjective hyperthermia, no subjective hypothermia, + excessive thirst, + decreased appetite, + frequent urination Cardiovascular: no chest pain, no shortness of breath, no palpitations, no leg swelling Respiratory: no cough, no shortness of breath Gastrointestinal: no nausea/vomiting/diarrhea Musculoskeletal: no muscle/joint aches Skin: no rashes, no hyperemia Neurological: no tremors, no numbness, no tingling, no dizziness Psychiatric: no depression, no anxiety  Objective:     BP 114/70 (BP Location: Left Arm, Patient Position: Sitting, Cuff Size: Large)   Pulse 76   Ht 5' 3 (1.6 m)   Wt 197 lb 12.8 oz (89.7 kg)   BMI 35.04 kg/m   Wt Readings from Last 3 Encounters:  12/14/23 197 lb 12.8 oz (89.7 kg)  07/20/23 200 lb (90.7 kg)  06/02/23 208 lb 9.6 oz (94.6 kg)     BP Readings from Last 3 Encounters:  12/14/23 114/70  07/20/23 (!) 147/80  06/02/23 129/81      Physical Exam- Limited  Constitutional:  Body mass index is 35.04 kg/m. ,  not in acute distress, normal state of mind Eyes:  EOMI, no exophthalmos Musculoskeletal: no gross deformities, strength intact in all four extremities, no gross restriction of joint movements Skin:  no rashes, no hyperemia Neurological: no tremor with outstretched hands   Diabetic Foot Exam - Simple   No data filed     CMP ( most  recent) CMP     Component Value Date/Time   NA 129 (L) 07/20/2023 1821   NA 141 09/25/2022 0000   K 4.4 07/20/2023 1821   CL 91 (L) 07/20/2023 1821   CO2 27 07/20/2023 1821   GLUCOSE 675 (HH) 07/20/2023 1821   BUN 22 (H) 07/20/2023 1821   BUN 11 09/25/2022 0000   CREATININE 1.18 (H) 07/20/2023 1821   CREATININE 1.01 09/10/2022 0949   CALCIUM 8.5 (L) 07/20/2023 1821   PROT 7.7 07/20/2023 1821   ALBUMIN 4.2 07/20/2023 1821   AST 17 07/20/2023 1821   ALT 16 07/20/2023 1821   ALKPHOS 122 07/20/2023 1821   BILITOT 0.9 07/20/2023 1821   GFRNONAA 55 (L) 07/20/2023 1821   GFRAA 82 12/13/2020 0000     Diabetic Labs (most recent): Lab Results  Component Value Date   HGBA1C 9.5 (A) 01/07/2023   HGBA1C 10.0 09/25/2022   HGBA1C 13.2 06/12/2022   MICROALBUR 11 12/13/2020     Lipid Panel ( most recent) Lipid Panel     Component Value Date/Time   CHOL 162 09/25/2022 0000   TRIG 94 06/12/2022 0000   HDL 51 09/25/2022 0000   LDLCALC 90 09/25/2022 0000      No results found for: TSH, FREET4         Assessment & Plan:   1) Type 2 diabetes mellitus with hyperglycemia, with long-term current use of insulin  (HCC)  - Lacey  Bradshaw has currently uncontrolled symptomatic type 2 DM since 53 years of age.   She presents today with her logs showing inconsistent glucose monitoring and gross hyperglycemia overall.  Her most recent A1c on 11/25 was 11.5%, increasing from last visit of 9.5%.  She notes she is under great amount of stress, caring for her father.  She sent a message between visits that she wanted to start using her Omnipod again as  glucose was better during that time.  She has been using it but has not been using CGM to check glucose, says she had several sensors that were defective.  Upon review of her pump, she has only been relying on basal rate to control glucose, not performing boluses at all.  -Recent labs reviewed.  - I had a long discussion with her about the progressive nature of diabetes and the pathology behind its complications. -her diabetes is complicated by CKD, retinopathy and neuropathy and she remains at a high risk for more acute and chronic complications which include CAD, CVA. These are all discussed in detail with her.  - Nutritional counseling repeated at each appointment due to patients tendency to fall back in to old habits.  - The patient admits there is a room for improvement in their diet and drink choices. -  Suggestion is made for the patient to avoid simple carbohydrates from their diet including Cakes, Sweet Desserts / Pastries, Ice Cream, Soda (diet and regular), Sweet Tea, Candies, Chips, Cookies, Sweet Pastries, Store Bought Juices, Alcohol in Excess of 1-2 drinks a day, Artificial Sweeteners, Coffee Creamer, and Sugar-free Products. This will help patient to have stable blood glucose profile and potentially avoid unintended weight gain.   - I encouraged the patient to switch to unprocessed or minimally processed complex starch and increased protein intake (animal or plant source), fruits, and vegetables.   - Patient is advised to stick to a routine mealtimes to eat 3 meals a day and avoid unnecessary snacks (to snack only to correct hypoglycemia).  - she has missed several appts  with Lacey Bradshaw, RDE for diabetes education.  - I have approached her with the following individualized plan to manage  her diabetes and patient agrees:   - I did not make adjustments to her Omnipod DASH settings today.  Instead, she is encouraged to bolus before meals and actually start eating on a  routine!  -she is encouraged to continue monitoring glucose 4 times daily (using her CGM), before meals and before bed, and to call the clinic if she has readings less than 70 or above 300 for 3 tests in a row.  I sent in script for Dexcom G7 to her pharmacy and gave her 2 sample sensors to start.  She certainly needs this device given her use of insulin  pump.  - she is warned not to take insulin  without proper monitoring per orders.  - Adjustment parameters are given to her for hypo and hyperglycemia in writing.  - she did not tolerate any incretin therapy in the past (N/V) and hx gastroparesis.  - Specific targets for  A1c;  LDL, HDL,  and Triglycerides were discussed with the patient.  2) Blood Pressure /Hypertension:  her blood pressure is controlled to target.   she is advised to continue her current medications including Lisinopril 5 mg p.o. daily with breakfast.  3) Lipids/Hyperlipidemia:    Review of her recent lipid panel from 03/30/23 showed controlled LDL at 83 .  she  is advised to continue Simvastatin 40 mg daily at bedtime.  Side effects and precautions discussed with her.  4)  Weight/Diet:  her Body mass index is 35.04 kg/m.  -  clearly complicating her diabetes care.   she is a candidate for weight loss. I discussed with her the fact that loss of 5 - 10% of her  current body weight will have the most impact on her diabetes management.  Exercise, and detailed carbohydrates information provided  -  detailed on discharge instructions.  5) Vitamin D  Deficiency: Her most recent vitamin D  level was 13.4 on 12/13/20.  She does not appear to be on any supplementation.  Will recheck on subsequent visits.  6) Chronic Care/Health Maintenance: -she is on ACEI/ARB and Statin medications and is encouraged to initiate and continue to follow up with Ophthalmology, Dentist, Podiatrist at least yearly or according to recommendations, and advised to stay away from smoking. I have recommended  yearly flu vaccine and pneumonia vaccine at least every 5 years; moderate intensity exercise for up to 150 minutes weekly; and sleep for at least 7 hours a day.  - she is advised to maintain close follow up with Lacey Norleen PEDLAR, MD for primary care needs, as well as her other providers for optimal and coordinated care.     I spent  43  minutes in the care of the patient today including review of labs from CMP, Lipids, Thyroid Function, Hematology (current and previous including abstractions from other facilities); face-to-face time discussing  her blood glucose readings/logs, discussing hypoglycemia and hyperglycemia episodes and symptoms, medications doses, her options of short and long term treatment based on the latest standards of care / guidelines;  discussion about incorporating lifestyle medicine;  and documenting the encounter. Risk reduction counseling performed per USPSTF guidelines to reduce obesity and cardiovascular risk factors.     Please refer to Patient Instructions for Blood Glucose Monitoring and Insulin /Medications Dosing Guide  in media tab for additional information. Please  also refer to  Patient Self Inventory in the Media  tab for reviewed  elements of pertinent patient history.  Lacey  Bradshaw participated in the discussions, expressed understanding, and voiced agreement with the above plans.  All questions were answered to her satisfaction. she is encouraged to contact clinic should she have any questions or concerns prior to her return visit.   Follow up plan: - Return in about 3 months (around 03/12/2024) for Diabetes F/U with A1c in office, No previsit labs, Bring meter and logs.  Benton Rio, [facility] [address] [place], [place] [address] Phone: [phone] Fax: [phone]  12/14/2023, 3:20 PM

## 2023-12-31 ENCOUNTER — Encounter: Payer: Self-pay | Admitting: Nurse Practitioner

## 2023-12-31 MED ORDER — ACCU-CHEK GUIDE ME W/DEVICE KIT: PACK | 0 refills | Status: AC

## 2024-01-06 ENCOUNTER — Ambulatory Visit (INDEPENDENT_AMBULATORY_CARE_PROVIDER_SITE_OTHER): Payer: MEDICAID | Admitting: Psychiatry

## 2024-01-06 DIAGNOSIS — F331 Major depressive disorder, recurrent, moderate: Secondary | ICD-10-CM

## 2024-01-06 NOTE — Progress Notes (Signed)
 Virtual Visit via Telephone Note  I connected with [name] on 01/06/24 at  1:00 PM EST by telephone and verified that I am speaking with the correct person using two identifiers.  Location: Patient:  Provider: [facility] [place] office    I discussed the limitations, risks, security and privacy concerns of performing an evaluation and management service by telephone and the availability of in person appointments. I also discussed with the patient that there may be a patient responsible charge related to this service. The patient expressed understanding and agreed to proceed.   I provided 45 minutes of non-face-to-face time during this encounter.   Lacey Salvage, LCSW   THERAPIST PROGRESS NOTE  Session Time: Thursday 01/06/2024 1:05 PM  - 1:50 PM       Participation Level: Active  Behavioral Response: CasualAlertAnxious  Type of Therapy: Individual Therapy  Treatment Goals addressed: [name] will score less than 5 on the Generalized Anxiety Disorder 7 Scale (GAD-7   Lean and implement 3 relaxation techniques, practice a technique daily   ProgressTowards Goals: not progressing   Interventions: CBT and Supportive  Summary: Lacey Bradshaw is a 53 y.o. female who is   is referred for services by PCP. She reports one psychiatric hospitalization in 2022 in [place] due to suicide attempt and depression.Pt reports participating in outpatient therapy in [place] and last was seen in 2017.Pt reports initially beginning to have problems with depression in 2017 when she was laid off her job.  She also began to have problems with anxiety.  Patient states often thinking about the past and says she is an over thinker.  She states difficulty sleeping and being snappy with people.  Other symptoms include poor concentration, fatigue, hopelessness, irritability muscle tension, restlessness, and worrying.  Stressors include concerns about her daughter who has overcome drug addiction  and being clean for almost 3 years.  Patient reports fear of daughter relapsing.  Patient reports additional stress regarding her situation as she does not have assisted in the source of income.  Patient has applied for disability and has been denied twice.   Patient last was seen about 2 -3  weeks ago.  She reports being less depressed but continued symptoms of anxiety as reflected in the GAD-7.  She reports multiple stressors including concerns her daughter may have relapsed regarding drug use, concerns about the safety of her grandson, her father's illness, and her relationship with her boyfriend.  She is trying to set and maintain limits with daughter while also being supportive.  She worries about grandson and checks on him daily.  Patient reports communication issues in the relationship with her boyfriend and is ambivalent about remaining in the relationship.  She reports feeling overwhelmed.  She reports mind is constantly going either worrying or thinking about things she needs to do.  She has been prescribed medication by PCP but does not take consistently.  She thankful to experience some relief regarding finances.  She learned today she has been approved for disability income and anticipates she will receive her first check in September 2025. Marland Kitchen  Suicidal/Homicidal: Nowithout intent/plan  Therapist Response: Reviewed symptoms, discussed stressors, facilitated expression of thoughts and feelings, validated feelings, praised and reinforced patient's efforts to try to set limits with her daughter, assisted patient identify realistic expectations of self, assisted patient identify ways to prioritize her worries using the back burner technique, assisted patient identify ways to improve assertiveness skills in the relationship with her boyfriend to express her concerns  as well as her needs, assisted patient identify possible script, discussed patient's personal rights to promote more effective assertion in  the relationship with her boyfriend, developed plan with patient to discuss medication concerns with her doctor at next visit next month  Plan: Return again in 2 weeks.  Diagnosis: Major depressive disorder, recurrent episode, moderate (HCC)  Collaboration of Care: Primary Care Provider AEB patient works with PCP regarding medication management  Patient/Guardian was advised Release of Information must be obtained prior to any record release in order to collaborate their care with an outside provider. Patient/Guardian was advised if they have not already done so to contact the registration department to sign all necessary forms in order for [place] to release information regarding their care.   Consent: Patient/Guardian gives verbal consent for treatment and assignment of benefits for services provided during this visit. Patient/Guardian expressed understanding and agreed to proceed.   Lacey Salvage, LCSW 01/06/2024

## 2024-01-12 ENCOUNTER — Other Ambulatory Visit: Payer: Self-pay | Admitting: Nurse Practitioner

## 2024-01-12 MED ORDER — OMNIPOD DASH PODS (GEN 4) MISC: 3 refills | Status: DC

## 2024-01-12 NOTE — Addendum Note (Signed)
 Addended by: Dani Gobble on: 01/12/2024 06:45 AM   Modules accepted: Orders

## 2024-01-14 MED ORDER — PEN NEEDLES 31G X 6 MM MISC: 3 refills | Status: DC

## 2024-01-14 NOTE — Addendum Note (Signed)
 Addended by: Dani Gobble on: 01/14/2024 06:48 AM   Modules accepted: Orders

## 2024-01-20 ENCOUNTER — Ambulatory Visit (INDEPENDENT_AMBULATORY_CARE_PROVIDER_SITE_OTHER): Payer: MEDICAID | Admitting: Psychiatry

## 2024-01-20 DIAGNOSIS — F331 Major depressive disorder, recurrent, moderate: Secondary | ICD-10-CM

## 2024-01-20 NOTE — Progress Notes (Signed)
 Virtual Visit via Telephone Note  I connected with Lacey Bradshaw on 01/20/24 at 1:08 PM EDT  by telephone and verified that I am speaking with the correct person using two identifiers.  Location: Patient: Home Provider: [facility] Outpatient [place] office    I discussed the limitations, risks, security and privacy concerns of performing an evaluation and management service by telephone and the availability of in person appointments. I also discussed with the patient that there may be a patient responsible charge related to this service. The patient expressed understanding and agreed to proceed.    I provided 42 minutes of non-face-to-face time during this encounter.   Lacey Salvage, LCSW  THERAPIST PROGRESS NOTE  Session Time: Thursday 01/24/2024 1:08 PM  - 1:50 PM   Participation Level: Active  Behavioral Response: CasualAlertAnxious  Type of Therapy: Individual Therapy  Treatment Goals addressed: [name] will score less than 5 on the Generalized Anxiety Disorder 7 Scale (GAD-7   Lean and implement 3 relaxation techniques, practice a technique daily   ProgressTowards Goals: not progressing   Interventions: CBT and Supportive  Summary: Lacey Bradshaw is a 53 y.o. female who is   is referred for services by PCP. She reports one psychiatric hospitalization in 2022 in [place] due to suicide attempt and depression.Pt reports participating in outpatient therapy in [place] and last was seen in 2017.Pt reports initially beginning to have problems with depression in 2017 when she was laid off her job.  She also began to have problems with anxiety.  Patient states often thinking about the past and says she is an over thinker.  She states difficulty sleeping and being snappy with people.  Other symptoms include poor concentration, fatigue, hopelessness, irritability muscle tension, restlessness, and worrying.  Stressors include concerns about her daughter who has overcome drug addiction  and being clean for almost 3 years.  Patient reports fear of daughter relapsing.  Patient reports additional stress regarding her situation as she does not have assisted in the source of income.  Patient has applied for disability and has been denied twice.   Patient last was seen about 2 -3  weeks ago. She reports decreased symptoms of anxiety as reflected in the GAD-7. She used assertiveness skills to express feelings to her boyfriend about the pace of their relationship and reports conversation was productive.  She also reports boyfriend was supportive.  She also reports she has been journaling her worry thoughts and then throwing them away.  Per patient's report, this has been helpful as well.  She also reports having less time for worry as she has been recovering from COVID.  She continues to express frustration and disappointment in her family as they are still not helping with caretaker responsibilities for their father. . .  Suicidal/Homicidal: Nowithout intent/plan  Therapist Response: Reviewed symptoms, administered GAD-7, discussed results, praised and reinforced patient's use of assertiveness skills in the relationship with her boyfriend, discussed effects on her mood/thoughts/behavior, assisted patient identify ways to continue to improve assertiveness skills in the relationship with her boyfriend, also began to assist patient identify her communication patterns in the relationship with her siblings, but assisted patient began to identify ways to improve assertiveness skills and to set/maintain limits, praised and reinforced patient's use of journaling her worries and then throwing away, discussed effects, developed plan with patient to continue journaling efforts   Plan: Return again in 2 weeks.  Diagnosis: Major depressive disorder, recurrent episode, moderate (HCC)  Collaboration of Care: Primary Care Provider AEB  patient works with PCP regarding medication management  Patient/Guardian  was advised Release of Information must be obtained prior to any record release in order to collaborate their care with an outside provider. Patient/Guardian was advised if they have not already done so to contact the registration department to sign all necessary forms in order for [place] to release information regarding their care.   Consent: Patient/Guardian gives verbal consent for treatment and assignment of benefits for services provided during this visit. Patient/Guardian expressed understanding and agreed to proceed.   Lacey Salvage, LCSW 01/20/2024

## 2024-02-02 ENCOUNTER — Ambulatory Visit (HOSPITAL_COMMUNITY): Payer: MEDICAID | Admitting: Psychiatry

## 2024-02-03 ENCOUNTER — Ambulatory Visit (HOSPITAL_COMMUNITY): Payer: MEDICAID | Admitting: Psychiatry

## 2024-02-17 ENCOUNTER — Ambulatory Visit (INDEPENDENT_AMBULATORY_CARE_PROVIDER_SITE_OTHER): Payer: MEDICAID | Admitting: Psychiatry

## 2024-02-17 DIAGNOSIS — F331 Major depressive disorder, recurrent, moderate: Secondary | ICD-10-CM | POA: Diagnosis not present

## 2024-02-17 NOTE — Progress Notes (Signed)
 Virtual Visit via Telephone Note  I connected with [name] on 02/17/24 at 1:04 PM EDT  by telephone and verified that I am speaking with the correct person using two identifiers.  Location: Patient: Car Provider: [facility] [place] office    I discussed the limitations, risks, security and privacy concerns of performing an evaluation and management service by telephone and the availability of in person appointments. I also discussed with the patient that there may be a patient responsible charge related to this service. The patient expressed understanding and agreed to proceed.    I provided  51  minutes of non-face-to-face time during this encounter.   Adah Salvage, LCSW  THERAPIST PROGRESS NOTE  Session Time: Thursday 02/17/2024 1:04 PM  -  1:55 PM   Participation Level: Active  Behavioral Response: CasualAlertAnxious  Type of Therapy: Individual Therapy  Treatment Goals addressed: [name] will score less than 5 on the Generalized Anxiety Disorder 7 Scale (GAD-7   Lean and implement 3 relaxation techniques, practice a technique daily   ProgressTowards Goals: not progressing   Interventions: CBT and Supportive  Summary: Lacey Bradshaw is a 53 y.o. female who is   is referred for services by PCP. She reports one psychiatric hospitalization in 2022 in [employer] due to suicide attempt and depression.Pt reports participating in outpatient therapy in [place] and last was seen in 2017.Pt reports initially beginning to have problems with depression in 2017 when she was laid off her job.  She also began to have problems with anxiety.  Patient states often thinking about the past and says she is an over thinker.  She states difficulty sleeping and being snappy with people.  Other symptoms include poor concentration, fatigue, hopelessness, irritability muscle tension, restlessness, and worrying.  Stressors include concerns about her daughter who has overcome drug addiction  and being clean for almost 3 years.  Patient reports fear of daughter relapsing.  Patient reports additional stress regarding her situation as she does not have assisted in the source of income.  Patient has applied for disability and has been denied twice.   Patient last was seen about 2 -3  weeks ago. She reports continued stress and anxiety as well as feeling overwhelmed.  Triggers remain caretaker responsibilities for her father and the relationship with her boyfriend.  Patient continues to express frustration regarding most of her siblings still are not helping her with caretaker responsibilities for their father.  She reports having 1 or 2 siblings that will help and has already worked with them to provide care for her father when she goes on a cruise the end of May.  She has tried to improve assertiveness skills with siblings but still struggles at times.  She also expresses frustration with her father as he is resistant to receiving help from his other children.  She also expresses disappointment and frustration with her boyfriend as he did not keep her promise regarding financial issues.  Patient reports continued stress issues regarding this as well as his fidelity.    Suicidal/Homicidal: Nowithout intent/plan  Therapist Response: Reviewed symptoms, praised and reinforced patient's efforts to try to improve assertiveness skills, discussed stressors, facilitated expression of thoughts and feelings, validated feelings assisted patient examine her pattern of interaction and ways to improve assertiveness skills with the use of I statements, assisted patient tried to identify ways to schedule more time for self, we will send patient handout on assertiveness, developed plan with patient to read in preparation for next session, developed  plan with patient to continue practicing relaxation techniques  Plan: Return again in 2 weeks.  Diagnosis: Major depressive disorder, recurrent episode, moderate  (HCC)  Collaboration of Care: Primary Care Provider AEB patient works with PCP regarding medication management  Patient/Guardian was advised Release of Information must be obtained prior to any record release in order to collaborate their care with an outside provider. Patient/Guardian was advised if they have not already done so to contact the registration department to sign all necessary forms in order for [place] to release information regarding their care.   Consent: Patient/Guardian gives verbal consent for treatment and assignment of benefits for services provided during this visit. Patient/Guardian expressed understanding and agreed to proceed.   Adah Salvage, LCSW 02/17/2024

## 2024-03-02 ENCOUNTER — Ambulatory Visit (INDEPENDENT_AMBULATORY_CARE_PROVIDER_SITE_OTHER): Payer: MEDICAID | Admitting: Psychiatry

## 2024-03-02 DIAGNOSIS — F331 Major depressive disorder, recurrent, moderate: Secondary | ICD-10-CM

## 2024-03-02 NOTE — Progress Notes (Unsigned)
 Virtual Visit via Telephone Note  I connected with Lacey  Bradshaw on 03/02/24 at 4:06  PM EDT  by telephone and verified that I am speaking with the correct person using two identifiers.  Location: Patient: Home Provider: [facility] [place] office    I discussed the limitations, risks, security and privacy concerns of performing an evaluation and management service by telephone and the availability of in person appointments. I also discussed with the patient that there may be a patient responsible charge related to this service. The patient expressed understanding and agreed to proceed.    I provided  44    minutes of non-face-to-face time during this encounter.   Dicie Foster, LCSW  THERAPIST PROGRESS NOTE  Session Time: Thursday 4/24 /2025 4:06 PM  4:50 PM   Participation Level: Active  Behavioral Response: CasualAlertAnxious  Type of Therapy: Individual Therapy  Treatment Goals addressed: [place]  will score less than 5 on the Generalized Anxiety Disorder 7 Scale (GAD-7   Lean and implement 3 relaxation techniques, practice a technique daily   ProgressTowards Goals: not progressing   Interventions: CBT and Supportive  Summary: Lacey  Bradshaw is a 53 y.o. female who is   is referred for services by PCP. She reports one psychiatric hospitalization in 2022 in [place] due to suicide attempt and depression.Pt reports participating in outpatient therapy in [place] and last was seen in 2017.Pt reports initially beginning to have problems with depression in 2017 when she was laid off her job.  She also began to have problems with anxiety.  Patient states often thinking about the past and says she is an over thinker.  She states difficulty sleeping and being snappy with people.  Other symptoms include poor concentration, fatigue, hopelessness, irritability muscle tension, restlessness, and worrying.  Stressors include concerns about her daughter who has overcome drug  addiction and being clean for almost 3 years.  Patient reports fear of daughter relapsing.  Patient reports additional stress regarding her situation as she does not have assisted in the source of income.  Patient has applied for disability and has been denied twice.   Patient last was seen about 2 -3  weeks ago. She reports continued stress and anxiety as well as feeling overwhelmed.  She continues to worry about her father as his health continues to decline.  She continues to express frustration regarding most of her siblings still not helping with caretaker responsibilities for their father.  She continues to struggle with use of assertiveness skills.  She reports additional stress regarding recent issues with her daughter.  Per her report, there is conflict between her daughter and her daughter's father.  She reports daughter constantly is calling her regarding their issues and now she worries about daughter's welfare.  Patient states trying to set and maintain limits with daughter but says she constantly worries about daughter as well as other issues.  Patient reports receiving handout and meal on assertiveness skills but has not had time to review.  Suicidal/Homicidal: Nowithout intent/plan  Therapist Response: Reviewed symptoms, discussed stressors, facilitated expression of thoughts and feelings, validated feelings, assisted patient identify realistic expectations of self, discussed rationale for and provided instructions on how to use a designated worry time, developed plan with patient to use a designated worry time daily, practice leaves on a stream exercise after the designated worry time, assisted patient identify ways to cope with worries that occur outside of her designated worry time , checked out interactive audio activity (leaves on a stream) and  provided patient with access code to assist her in her efforts.     Plan: Return again in 2 weeks.        Diagnosis: Major depressive disorder,  recurrent episode, moderate (HCC)  Collaboration of Care: Primary Care Provider AEB patient works with PCP regarding medication management  Patient/Guardian was advised Release of Information must be obtained prior to any record release in order to collaborate their care with an outside provider. Patient/Guardian was advised if they have not already done so to contact the registration department to sign all necessary forms in order for us  to release information regarding their care.   Consent: Patient/Guardian gives verbal consent for treatment and assignment of benefits for services provided during this visit. Patient/Guardian expressed understanding and agreed to proceed.   Dicie Foster, LCSW 03/02/2024

## 2024-03-05 NOTE — Therapy (Deleted)
 OUTPATIENT PHYSICAL THERAPY THORACOLUMBAR EVALUATION   Patient Name: Lacey Bradshaw MRN: [id number] DOB:22-Dec-1970, 53 y.o., female Today's Date: 03/05/2024  END OF SESSION:   Past Medical History:  Diagnosis Date   Acid reflux    Bulging of lumbar intervertebral disc    Diabetes mellitus without complication (HCC)    borderline   Diabetes mellitus, type II (HCC)    Gastroparesis    Hyperlipidemia    Hyposomnia, insomnia or sleeplessness associated with anxiety    Migraine    Past Surgical History:  Procedure Laterality Date   ABDOMINAL HYSTERECTOMY     2009   CARPAL TUNNEL RELEASE Left    CATARACT EXTRACTION Bilateral 03/2022   FRACTURE SURGERY     jaw   TUBAL LIGATION     Patient Active Problem List   Diagnosis Date Noted   Pain in left hip 04/06/2023   Bilateral primary osteoarthritis of knee 04/06/2023   Bilateral shoulder pain 09/10/2022   Bilateral hand pain 09/10/2022   Abnormal renal function 06/17/2022   Cervical radiculopathy 06/16/2022   Ulnar neuropathy of right upper extremity 06/15/2022   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   Major depressive disorder 03/09/2022   Chronic pain syndrome 11/20/2021   Neuropathy 08/15/2021   Abnormal vision 08/15/2021   Chronic insomnia 08/15/2021   Contact dermatitis 08/15/2021   Dizziness 08/15/2021   Syncope 08/15/2021   Cataracts, bilateral 06/28/2021   Type 2 diabetes mellitus (HCC) 03/21/2021   Gastroesophageal reflux disease without esophagitis 03/21/2021   Migraine 03/21/2021   Morbid obesity (HCC) 03/21/2021   Mixed hyperlipidemia 03/21/2021   Chronic bilateral low back pain 10/17/2018   Spondylolisthesis, lumbar region 07/04/2018   Essential (primary) hypertension 07/04/2018   Carpal tunnel syndrome 06/01/2013    PCP: Omie Bickers, MD  REFERRING PROVIDER: Samul Croft, Cherryl Corona, NP  REFERRING DIAG:  [phone] (ICD-10-CM) - Spondylosis without myelopathy or radiculopathy, lumbar region  M47.22  (ICD-10-CM) - Other spondylosis with radiculopathy, cervical region    Rationale for Evaluation and Treatment: Rehabilitation  THERAPY DIAG:  No diagnosis found.  ONSET DATE: ***  SUBJECTIVE:                                                                                                                                                                                           SUBJECTIVE STATEMENT: ***  PERTINENT HISTORY:  ***  PAIN:  Are you having pain? {OPRCPAIN:27236}  PRECAUTIONS: {Therapy precautions:24002}  RED FLAGS: {PT Red Flags:29287}   WEIGHT BEARING RESTRICTIONS: {Yes ***/No:24003}  FALLS:  Has patient fallen in last 6 months? {fallsyesno:27318}  LIVING ENVIRONMENT: Lives with: {OPRC lives with:25569::"lives with their family"}  Lives in: {Lives in:25570} Stairs: {opstairs:27293} Has following equipment at home: {Assistive devices:23999}  OCCUPATION: ***  PLOF: {PLOF:24004}  PATIENT GOALS: ***  NEXT MD VISIT: ***  OBJECTIVE:  Note: Objective measures were completed at Evaluation unless otherwise noted.  DIAGNOSTIC FINDINGS:  IMPRESSION: 1. Lumbar spondylosis and degenerative disc disease, causing mild impingement at the L3-4 and L4-5 levels as detailed above.  L3-4: Mild displacement of the left L3 nerve in the lateral extraforaminal space due to left lateral extraforaminal disc protrusion. Mild bilateral facet arthropathy.   L4-5: Mild left subarticular lateral recess stenosis due to central disc protrusion, disc bulge, and mild facet arthropathy.   L5-S1: Borderline bilateral foraminal stenosis due to disc bulge, intervertebral spurring, and facet arthropathy.  PATIENT SURVEYS:  Modified Oswestry ***   COGNITION: Overall cognitive status: {cognition:24006}     SENSATION: {sensation:27233}  MUSCLE LENGTH: Hamstrings: Right *** deg; Left *** deg Andy Bannister test: Right *** deg; Left *** deg  POSTURE:  {posture:25561}  PALPATION: ***  LUMBAR ROM:   AROM eval  Flexion   Extension   Right lateral flexion   Left lateral flexion   Right rotation   Left rotation    (Blank rows = not tested)  LOWER EXTREMITY ROM:     {AROM/PROM:27142}  Right eval Left eval  Hip flexion    Hip extension    Hip abduction    Hip adduction    Hip internal rotation    Hip external rotation    Knee flexion    Knee extension    Ankle dorsiflexion    Ankle plantarflexion    Ankle inversion    Ankle eversion     (Blank rows = not tested)  LOWER EXTREMITY MMT:    MMT Right eval Left eval  Hip flexion    Hip extension    Hip abduction    Hip adduction    Hip internal rotation    Hip external rotation    Knee flexion    Knee extension    Ankle dorsiflexion    Ankle plantarflexion    Ankle inversion    Ankle eversion     (Blank rows = not tested)  LUMBAR SPECIAL TESTS:  {lumbar special test:25242}  FUNCTIONAL TESTS:  {Functional tests:24029}  GAIT: Distance walked: *** Assistive device utilized: {Assistive devices:23999} Level of assistance: {Levels of assistance:24026} Comments: ***  TREATMENT DATE:  03/06/24: PT Eval and HEP                                                                                                                                 PATIENT EDUCATION:  Education details: PT evaluation, objective findings, POC, Importance of HEP, Precautions, Clinic policies  Person educated: Patient Education method: Explanation and Demonstration Education comprehension: verbalized understanding and returned demonstration  HOME EXERCISE PROGRAM: ***  ASSESSMENT:  CLINICAL IMPRESSION: Patient is a 53 y.o. female who was seen today for physical therapy evaluation and treatment  for  M47.816 (ICD-10-CM) - Spondylosis without myelopathy or radiculopathy, lumbar region  M47.22 (ICD-10-CM) - Other spondylosis with radiculopathy, cervical region  .   OBJECTIVE  IMPAIRMENTS: {opptimpairments:25111}.   ACTIVITY LIMITATIONS: {activitylimitations:27494}  PARTICIPATION LIMITATIONS: {participationrestrictions:25113}  PERSONAL FACTORS: {Personal factors:25162} are also affecting patient's functional outcome.   REHAB POTENTIAL: {rehabpotential:25112}  CLINICAL DECISION MAKING: {clinical decision making:25114}  EVALUATION COMPLEXITY: {Evaluation complexity:25115}   GOALS: Goals reviewed with patient? No  SHORT TERM GOALS: Target date: 03/20/24  Patient will be independent with performance of HEP to demonstrate adequate self management of symptoms.  Baseline:  Goal status: INITIAL  2.   Patient will report at least a 25% improvement with function or pain overall since beginning PT. Baseline:  Goal status: INITIAL   LONG TERM GOALS: Target date: 04/17/24  *** Baseline:  Goal status: INITIAL  2.  *** Baseline:  Goal status: INITIAL  3.  *** Baseline:  Goal status: INITIAL  4.  *** Baseline:  Goal status: INITIAL  5.  *** Baseline:  Goal status: INITIAL  6.  *** Baseline:  Goal status: INITIAL  PLAN:  PT FREQUENCY: 1-2x/week  PT DURATION: 6 weeks  PLANNED INTERVENTIONS: 97164- PT Re-evaluation, 97110-Therapeutic exercises, 97530- Therapeutic activity, 97112- Neuromuscular re-education, 97535- Self Care, 40981- Manual therapy, 19147- Gait training, Patient/Family education, Balance training, Stair training, Joint mobilization, Spinal mobilization, and Moist heat.  PLAN FOR NEXT SESSION: ***   Ardath Koh Powell-Butler, PT 03/05/2024, 12:09 PM

## 2024-03-06 ENCOUNTER — Ambulatory Visit (HOSPITAL_COMMUNITY): Payer: MEDICAID

## 2024-03-14 ENCOUNTER — Ambulatory Visit (INDEPENDENT_AMBULATORY_CARE_PROVIDER_SITE_OTHER): Payer: MEDICAID | Admitting: Nurse Practitioner

## 2024-03-14 ENCOUNTER — Encounter: Payer: Self-pay | Admitting: Nurse Practitioner

## 2024-03-14 VITALS — BP 118/78 | HR 57 | Ht 63.0 in | Wt 207.4 lb

## 2024-03-14 DIAGNOSIS — E1165 Type 2 diabetes mellitus with hyperglycemia: Secondary | ICD-10-CM | POA: Diagnosis not present

## 2024-03-14 DIAGNOSIS — I10 Essential (primary) hypertension: Secondary | ICD-10-CM | POA: Diagnosis not present

## 2024-03-14 DIAGNOSIS — E782 Mixed hyperlipidemia: Secondary | ICD-10-CM | POA: Diagnosis not present

## 2024-03-14 DIAGNOSIS — Z794 Long term (current) use of insulin: Secondary | ICD-10-CM | POA: Diagnosis not present

## 2024-03-14 LAB — POCT GLYCOSYLATED HEMOGLOBIN (HGB A1C): Hemoglobin A1C: 9.8 % — AB (ref 4.0–5.6)

## 2024-03-14 MED ORDER — PEN NEEDLES 31G X 6 MM MISC: 3 refills | Status: AC

## 2024-03-14 MED ORDER — NOVOLOG FLEXPEN 100 UNIT/ML ~~LOC~~ SOPN: 8.0000 [IU] | PEN_INJECTOR | Freq: Three times a day (TID) | SUBCUTANEOUS | 3 refills | Status: DC

## 2024-03-14 MED ORDER — LANTUS SOLOSTAR 100 UNIT/ML ~~LOC~~ SOPN: 30.0000 [IU] | PEN_INJECTOR | Freq: Every day | SUBCUTANEOUS | 3 refills | Status: DC

## 2024-03-14 NOTE — Progress Notes (Signed)
 Endocrinology Follow Up Note       03/14/2024, 2:39 PM   Subjective:    Patient ID: Lacey Bradshaw, female    DOB: 31-Jan-1971.  Lacey Bradshaw is being seen in follow up after being seen in consultation for management of currently uncontrolled symptomatic diabetes requested by  Omie Bickers, MD.   Past Medical History:  Diagnosis Date   Acid reflux    Bulging of lumbar intervertebral disc    Diabetes mellitus without complication (HCC)    borderline   Diabetes mellitus, type II (HCC)    Gastroparesis    Hyperlipidemia    Hyposomnia, insomnia or sleeplessness associated with anxiety    Migraine     Past Surgical History:  Procedure Laterality Date   ABDOMINAL HYSTERECTOMY     2009   CARPAL TUNNEL RELEASE Left    CATARACT EXTRACTION Bilateral 03/2022   FRACTURE SURGERY     jaw   TUBAL LIGATION      Social History   Socioeconomic History   Marital status: Single    Spouse name: Not on file   Number of children: Not on file   Years of education: Not on file   Highest education level: Not on file  Occupational History   Not on file  Tobacco Use   Smoking status: Never    Passive exposure: Past   Smokeless tobacco: Never  Vaping Use   Vaping status: Never Used  Substance and Sexual Activity   Alcohol use: No   Drug use: No   Sexual activity: Not Currently  Other Topics Concern   Not on file  Social History Narrative   Not on file   Social Drivers of Health   Financial Resource Strain: Low Risk  (02/08/2024)   Received from [facility]   Overall Financial Resource Strain (CARDIA)    Difficulty of Paying Living Expenses: Not very hard  Food Insecurity: No Food Insecurity (02/08/2024)   Received from [facility]   Hunger Vital Sign    Worried About Running Out of Food in the Last Year: Never true    Ran Out of Food in the Last Year: Never true  Transportation Needs: No Transportation  Needs (02/08/2024)   Received from [facility] - Transportation    Lack of Transportation (Medical): No    Lack of Transportation (Non-Medical): No  Physical Activity: Insufficiently Active (06/15/2022)   Received from [facility]   Exercise Vital Sign    Days of Exercise per Week: 2 days    Minutes of Exercise per Session: 30 min  Stress: Stress Concern Present (06/15/2022)   Received from [facility] - Occupational Stress Questionnaire    Feeling of Stress : Very much  Social Connections: Unknown (06/21/2023)   Received from [facility]   Social Network    Social Network: Not on file    Family History  Problem Relation Age of Onset   Cancer Mother    Congestive Heart Failure Father    Hypertension Father    High Cholesterol Father    Stroke Father    Heart attack Father  Diabetes Sister    COPD Sister    Congestive Heart Failure Sister    Arthritis Sister    Diabetes Sister    Diabetes Sister    Depression Sister    Diabetes Sister    Diabetes Sister    Diabetes Sister    Diabetes Sister    Depression Brother    Hypertension Brother    Diabetes Brother    Healthy Daughter     Outpatient Encounter Medications as of 03/14/2024  Medication Sig   alprazolam (XANAX) 2 MG tablet Take 2 mg by mouth as needed for anxiety or sleep.   atorvastatin (LIPITOR) 80 MG tablet Take 80 mg by mouth daily.   BD VEO INSULIN  SYRINGE U/F 31G X 15/64" 0.3 ML MISC ONE SYRINGE PER INSULIN  USE   Blood Glucose Monitoring Suppl (ACCU-CHEK GUIDE ME) w/Device KIT Use to check glucose 4 times daily.  Use as back up to CGM   celecoxib (CELEBREX) 200 MG capsule Take 200 mg by mouth 2 (two) times daily.   clotrimazole  (LOTRIMIN ) 1 % cream Apply to affected area 2 times daily   cyclobenzaprine  (FLEXERIL ) 5 MG tablet Take 1 tablet (5 mg total) by mouth at bedtime.   gabapentin (NEURONTIN) 300 MG capsule Take 300 mg  by mouth as needed.   hydrALAZINE (APRESOLINE) 25 MG tablet Take 25 mg by mouth daily.   HYDROcodone-acetaminophen  (NORCO) 10-325 MG tablet Take 1 tablet by mouth as needed for severe pain.   insulin  aspart (NOVOLOG  FLEXPEN) 100 UNIT/ML FlexPen Inject 8-14 Units into the skin 3 (three) times daily with meals.   omeprazole (PRILOSEC) 40 MG capsule Take 1 tablet by mouth daily.   pregabalin (LYRICA) 100 MG capsule Take 100 mg by mouth 3 (three) times daily.   vortioxetine HBr (TRINTELLIX) 5 MG TABS tablet Take 5 mg by mouth daily.   [DISCONTINUED] insulin  aspart (NOVOLOG ) 100 UNIT/ML injection Use with Omnipod for TDD around 60 units   [DISCONTINUED] insulin  glargine (LANTUS  SOLOSTAR) 100 UNIT/ML Solostar Pen Inject 30 Units into the skin at bedtime.   [DISCONTINUED] Insulin  Pen Needle (PEN NEEDLES) 31G X 6 MM MISC Use to inject insulin  4 times daily   Continuous Glucose Sensor (DEXCOM G7 SENSOR) MISC Inject 1 Application into the skin as directed. Change sensor every 10 days as directed. (Patient not taking: Reported on 03/14/2024)   insulin  glargine (LANTUS  SOLOSTAR) 100 UNIT/ML Solostar Pen Inject 30 Units into the skin at bedtime.   Insulin  Pen Needle (PEN NEEDLES) 31G X 6 MM MISC Use to inject insulin  4 times daily   [DISCONTINUED] Insulin  Disposable Pump (OMNIPOD DASH PODS, GEN 4,) MISC Change pod every other day (Patient not taking: Reported on 03/14/2024)   No facility-administered encounter medications on file as of 03/14/2024.    ALLERGIES: Allergies  Allergen Reactions   Dapagliflozin Other (See Comments)    Nausea and vomiting Pt states she is not allergic   Dulaglutide     Other reaction(s): Makes her feel terrible.   Liraglutide     Other reaction(s): burping & belching    VACCINATION STATUS: Immunization History  Administered Date(s) Administered   Zoster Recombinant(Shingrix) 03/10/2022    Diabetes She presents for her follow-up diabetic visit. She has type 2 diabetes  mellitus. Onset time: diagnosed at approx age of 38. Her disease course has been worsening. There are no hypoglycemic associated symptoms. Associated symptoms include blurred vision, fatigue, foot paresthesias, polydipsia, polyphagia, polyuria, visual change and weight loss. There are no hypoglycemic  complications. Symptoms are stable. Diabetic complications include peripheral neuropathy and retinopathy. Risk factors for coronary artery disease include diabetes mellitus, dyslipidemia, hypertension, obesity, sedentary lifestyle and stress. Current diabetic treatment includes insulin  pump. She is compliant with treatment some of the time. Her weight is fluctuating minimally. She is following a generally unhealthy diet. Meal planning includes avoidance of concentrated sweets. She has had a previous visit with a dietitian. She participates in exercise three times a week. Her home blood glucose trend is increasing rapidly. Her overall blood glucose range is >200 mg/dl. (She presents today with her logs showing inconsistent glucose monitoring and gross hyperglycemia overall.  Her POCT A1c today was 9.8%, improving from last visit of 11.5%.  She notes she is having difficulty keeping the Omnipods on.  Sometimes a dexcom will fall off but has better luck with this. She has been performing fingersticks until she is due for refill on her Dexcom sensors this coming week.  Analysis of her meter shows 7-day average of 431 with 7 readings; 14-day average of 406 with 10 readings; 30-day average of 344 with 23 readings; 90-day average of 343 with 32 readings.  She notes she feel her glucose may drop at night due to symptoms but has not actually checked glucose at those times.) An ACE inhibitor/angiotensin II receptor blocker is being taken. She does not see a podiatrist.Eye exam is current (has appt for f/u next week).  Hyperlipidemia This is a chronic problem. The current episode started more than 1 year ago. The problem is  uncontrolled. Recent lipid tests were reviewed and are high. Exacerbating diseases include chronic renal disease, diabetes and obesity. Factors aggravating her hyperlipidemia include fatty foods. Current antihyperlipidemic treatment includes statins. The current treatment provides mild improvement of lipids. Compliance problems include adherence to exercise and adherence to diet.  Risk factors for coronary artery disease include diabetes mellitus, dyslipidemia, hypertension, obesity and stress.  Hypertension This is a chronic problem. The current episode started more than 1 year ago. The problem has been resolved since onset. The problem is controlled. Associated symptoms include blurred vision. There are no associated agents to hypertension. Risk factors for coronary artery disease include diabetes mellitus, dyslipidemia, stress and obesity. Past treatments include ACE inhibitors. The current treatment provides significant improvement. There are no compliance problems.  Hypertensive end-organ damage includes kidney disease and retinopathy. Identifiable causes of hypertension include chronic renal disease.     Review of systems  Constitutional: + fluctuating body weight,  current Body mass index is 36.74 kg/m. , + fatigue, no subjective hyperthermia, no subjective hypothermia, + excessive thirst, + decreased appetite, + frequent urination Cardiovascular: no chest pain, no shortness of breath, no palpitations, no leg swelling Respiratory: no cough, no shortness of breath Gastrointestinal: no nausea/vomiting/diarrhea Musculoskeletal: no muscle/joint aches Skin: no rashes, no hyperemia Neurological: no tremors, no numbness, + tingling all over (says feels like pin pricks), no dizziness Psychiatric: no depression, no anxiety, + increased stress  Objective:     BP 118/78 (BP Location: Right Arm, Patient Position: Sitting, Cuff Size: Large)   Pulse (!) 57   Ht 5\' 3"  (1.6 m)   Wt 207 lb 6.4 oz (94.1  kg)   BMI 36.74 kg/m   Wt Readings from Last 3 Encounters:  03/14/24 207 lb 6.4 oz (94.1 kg)  12/14/23 197 lb 12.8 oz (89.7 kg)  07/20/23 200 lb (90.7 kg)     BP Readings from Last 3 Encounters:  03/14/24 118/78  12/14/23 114/70  07/20/23 Lacey Aas)  147/80      Physical Exam- Limited  Constitutional:  Body mass index is 36.74 kg/m. , not in acute distress, normal state of mind Eyes:  EOMI, no exophthalmos Musculoskeletal: no gross deformities, strength intact in all four extremities, no gross restriction of joint movements Skin:  no rashes, no hyperemia Neurological: no tremor with outstretched hands   Diabetic Foot Exam - Simple   No data filed     CMP ( most recent) CMP     Component Value Date/Time   NA 129 (L) 07/20/2023 1821   NA 141 09/25/2022 0000   K 4.4 07/20/2023 1821   CL 91 (L) 07/20/2023 1821   CO2 27 07/20/2023 1821   GLUCOSE 675 (HH) 07/20/2023 1821   BUN 22 (H) 07/20/2023 1821   BUN 11 09/25/2022 0000   CREATININE 1.18 (H) 07/20/2023 1821   CREATININE 1.01 09/10/2022 0949   CALCIUM 8.5 (L) 07/20/2023 1821   PROT 7.7 07/20/2023 1821   ALBUMIN 4.2 07/20/2023 1821   AST 17 07/20/2023 1821   ALT 16 07/20/2023 1821   ALKPHOS 122 07/20/2023 1821   BILITOT 0.9 07/20/2023 1821   GFRNONAA 55 (L) 07/20/2023 1821   GFRAA 82 12/13/2020 0000     Diabetic Labs (most recent): Lab Results  Component Value Date   HGBA1C 9.8 (A) 03/14/2024   HGBA1C 9.5 (A) 01/07/2023   HGBA1C 10.0 09/25/2022   MICROALBUR 11 12/13/2020     Lipid Panel ( most recent) Lipid Panel     Component Value Date/Time   CHOL 162 09/25/2022 0000   TRIG 94 06/12/2022 0000   HDL 51 09/25/2022 0000   LDLCALC 90 09/25/2022 0000      No results found for: "TSH", "FREET4"         Assessment & Plan:   1) Type 2 diabetes mellitus with hyperglycemia, with long-term current use of insulin  (HCC)  - Lacey Bradshaw has currently uncontrolled symptomatic type 2 DM since 53 years of  age.   She presents today with her logs showing inconsistent glucose monitoring and gross hyperglycemia overall.  Her POCT A1c today was 9.8%, improving from last visit of 11.5%.  She notes she is having difficulty keeping the Omnipods on.  Sometimes a dexcom will fall off but has better luck with this. She has been performing fingersticks until she is due for refill on her Dexcom sensors this coming week.  Analysis of her meter shows 7-day average of 431 with 7 readings; 14-day average of 406 with 10 readings; 30-day average of 344 with 23 readings; 90-day average of 343 with 32 readings.  She notes she feel her glucose may drop at night due to symptoms but has not actually checked glucose at those times.  -Recent labs reviewed.  - I had a long discussion with her about the progressive nature of diabetes and the pathology behind its complications. -her diabetes is complicated by CKD, retinopathy and neuropathy and she remains at a high risk for more acute and chronic complications which include CAD, CVA. These are all discussed in detail with her.  - Nutritional counseling repeated at each appointment due to patients tendency to fall back in to old habits.  - The patient admits there is a room for improvement in their diet and drink choices. -  Suggestion is made for the patient to avoid simple carbohydrates from their diet including Cakes, Sweet Desserts / Pastries, Ice Cream, Soda (diet and regular), Sweet Tea, Candies, Chips, Cookies, Sweet Pastries, Store  Bought Juices, Alcohol in Excess of 1-2 drinks a day, Artificial Sweeteners, Coffee Creamer, and "Sugar-free" Products. This will help patient to have stable blood glucose profile and potentially avoid unintended weight gain.   - I encouraged the patient to switch to unprocessed or minimally processed complex starch and increased protein intake (animal or plant source), fruits, and vegetables.   - Patient is advised to stick to a routine  mealtimes to eat 3 meals a day and avoid unnecessary snacks (to snack only to correct hypoglycemia).  - she has missed several appts with Melva Stabile, RDE for diabetes education.  - I have approached her with the following individualized plan to manage  her diabetes and patient agrees:   - She is advised to continue Lantus  30 units SQ nightly and Novolog  8-14 units TID with meals if glucose is above 90 and she is eating (Specific instructions on how to titrate insulin  dosage based on glucose readings given to patient in writing).  Will discontinue her Omnipod today, she ends up wasting product and thus money since they will not stay on her skin.  May look at a tubed pump moving forward, but we may still run into issues with adhesive on her body.  -she is encouraged to continue monitoring glucose 4 times daily (using her CGM or fingersticks), before meals and before bed, and to call the clinic if she has readings less than 70 or above 300 for 3 tests in a row.   - she is warned not to take insulin  without proper monitoring per orders.  - Adjustment parameters are given to her for hypo and hyperglycemia in writing.  - she did not tolerate any incretin therapy in the past (N/V) and hx gastroparesis.  - Specific targets for  A1c;  LDL, HDL,  and Triglycerides were discussed with the patient.  2) Blood Pressure /Hypertension:  her blood pressure is controlled to target.   she is advised to continue her current medications including Lisinopril 5 mg p.o. daily with breakfast.  3) Lipids/Hyperlipidemia:    Review of her recent lipid panel from 03/30/23 showed controlled LDL at 83 .  she  is advised to continue Simvastatin 40 mg daily at bedtime.  Side effects and precautions discussed with her.  4)  Weight/Diet:  her Body mass index is 36.74 kg/m.  -  clearly complicating her diabetes care.   she is a candidate for weight loss. I discussed with her the fact that loss of 5 - 10% of her  current  body weight will have the most impact on her diabetes management.  Exercise, and detailed carbohydrates information provided  -  detailed on discharge instructions.  5) Vitamin D Deficiency: Her most recent vitamin D level was 13.4 on 12/13/20.  She does not appear to be on any supplementation.  Will recheck on subsequent visits.  6) Chronic Care/Health Maintenance: -she is on ACEI/ARB and Statin medications and is encouraged to initiate and continue to follow up with Ophthalmology, Dentist, Podiatrist at least yearly or according to recommendations, and advised to stay away from smoking. I have recommended yearly flu vaccine and pneumonia vaccine at least every 5 years; moderate intensity exercise for up to 150 minutes weekly; and sleep for at least 7 hours a day.  - she is advised to maintain close follow up with Omie Bickers, MD for primary care needs, as well as her other providers for optimal and coordinated care.     I spent  48  minutes in the care of the patient today including review of labs from CMP, Lipids, Thyroid Function, Hematology (current and previous including abstractions from other facilities); face-to-face time discussing  her blood glucose readings/logs, discussing hypoglycemia and hyperglycemia episodes and symptoms, medications doses, her options of short and long term treatment based on the latest standards of care / guidelines;  discussion about incorporating lifestyle medicine;  and documenting the encounter. Risk reduction counseling performed per USPSTF guidelines to reduce obesity and cardiovascular risk factors.     Please refer to Patient Instructions for Blood Glucose Monitoring and Insulin /Medications Dosing Guide"  in media tab for additional information. Please  also refer to " Patient Self Inventory" in the Media  tab for reviewed elements of pertinent patient history.  Arletta  Bonczek participated in the discussions, expressed understanding, and voiced agreement  with the above plans.  All questions were answered to her satisfaction. she is encouraged to contact clinic should she have any questions or concerns prior to her return visit.   Follow up plan: - Return in about 3 months (around 06/14/2024) for Diabetes F/U with A1c in office, No previsit labs, Bring meter and logs.  Hulon Magic, [facility] [address] [place], [place] [address] Phone: [phone] Fax: [phone]  03/14/2024, 2:39 PM

## 2024-03-22 ENCOUNTER — Ambulatory Visit (HOSPITAL_COMMUNITY): Payer: MEDICAID | Admitting: Psychiatry

## 2024-04-05 ENCOUNTER — Ambulatory Visit (HOSPITAL_COMMUNITY): Payer: MEDICAID | Admitting: Psychiatry

## 2024-04-05 ENCOUNTER — Telehealth (HOSPITAL_COMMUNITY): Payer: Self-pay | Admitting: Psychiatry

## 2024-04-05 NOTE — Telephone Encounter (Signed)
 Therapist called patient regarding scheduled in office appointment and received voicemail recording.  Therapist left message indicating attempt and requesting patient call office.

## 2024-04-16 NOTE — Therapy (Unsigned)
 OUTPATIENT PHYSICAL THERAPY THORACOLUMBAR EVALUATION   Patient Name: Lacey  Bradshaw MRN: [id number] DOB:[birth year], 53 y.o., female Today's Date: 04/16/2024  END OF SESSION:   Past Medical History:  Diagnosis Date   Acid reflux    Bulging of lumbar intervertebral disc    Diabetes mellitus without complication (HCC)    borderline   Diabetes mellitus, type II (HCC)    Gastroparesis    Hyperlipidemia    Hyposomnia, insomnia or sleeplessness associated with anxiety    Migraine    Past Surgical History:  Procedure Laterality Date   ABDOMINAL HYSTERECTOMY     2009   CARPAL TUNNEL RELEASE Left    CATARACT EXTRACTION Bilateral 03/2022   FRACTURE SURGERY     jaw   TUBAL LIGATION     Patient Active Problem List   Diagnosis Date Noted   Pain in left hip 04/06/2023   Bilateral primary osteoarthritis of knee 04/06/2023   Bilateral shoulder pain 09/10/2022   Bilateral hand pain 09/10/2022   Abnormal renal function 06/17/2022   Cervical radiculopathy 06/16/2022   Ulnar neuropathy of right upper extremity 06/15/2022   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   Major depressive disorder 03/09/2022   Chronic pain syndrome 11/20/2021   Neuropathy 08/15/2021   Abnormal vision 08/15/2021   Chronic insomnia 08/15/2021   Contact dermatitis 08/15/2021   Dizziness 08/15/2021   Syncope 08/15/2021   Cataracts, bilateral 06/28/2021   Type 2 diabetes mellitus (HCC) 03/21/2021   Gastroesophageal reflux disease without esophagitis 03/21/2021   Migraine 03/21/2021   Morbid obesity (HCC) 03/21/2021   Mixed hyperlipidemia 03/21/2021   Chronic bilateral low back pain 10/17/2018   Spondylolisthesis, lumbar region 07/04/2018   Essential (primary) hypertension 07/04/2018   Carpal tunnel syndrome 06/01/2013    PCP: Omie Bickers, MD  REFERRING PROVIDER: Samul Croft, Cherryl Corona, NP  REFERRING DIAG: [phone] (ICD-10-CM) - Spondylosis without myelopathy or radiculopathy, lumbar region M47.22  (ICD-10-CM) - Other spondylosis with radiculopathy, cervical region  Rationale for Evaluation and Treatment: Rehabilitation  THERAPY DIAG:  No diagnosis found.  ONSET DATE: ***  SUBJECTIVE:                                                                                                                                                                                           SUBJECTIVE STATEMENT: ***  PERTINENT HISTORY:  ***  PAIN:  Are you having pain? {OPRCPAIN:27236}  PRECAUTIONS: None  RED FLAGS: {PT Red Flags:29287}   WEIGHT BEARING RESTRICTIONS: No  FALLS:  Has patient fallen in last 6 months? {fallsyesno:27318}  LIVING ENVIRONMENT: Lives with: {[facility]RC lives with:25569::"lives with their family"} Lives in: {Lives in:25570} Stairs: {opstairs:27293}  Has following equipment at home: {Assistive devices:23999}  OCCUPATION: ***  PLOF: {PLOF:24004}  PATIENT GOALS: ***  NEXT MD VISIT: ***  OBJECTIVE:  Note: Objective measures were completed at Evaluation unless otherwise noted.  DIAGNOSTIC FINDINGS:  IMPRESSION: 1. Lumbar spondylosis and degenerative disc disease, causing mild impingement at the L3-4 and L4-5 levels as detailed above.  L3-4: Mild displacement of the left L3 nerve in the lateral extraforaminal space due to left lateral extraforaminal disc protrusion. Mild bilateral facet arthropathy.   L4-5: Mild left subarticular lateral recess stenosis due to central disc protrusion, disc bulge, and mild facet arthropathy.   L5-S1: Borderline bilateral foraminal stenosis due to disc bulge, intervertebral spurring, and facet arthropathy.  PATIENT SURVEYS:  Modified Oswestry ***   COGNITION: Overall cognitive status: {cognition:24006}     SENSATION: {sensation:27233}  MUSCLE LENGTH: Hamstrings: Right *** deg; Left *** deg Andy Bannister test: Right *** deg; Left *** deg  POSTURE: {posture:25561}  PALPATION: ***  LUMBAR ROM:   AROM eval  Flexion    Extension   Right lateral flexion   Left lateral flexion   Right rotation   Left rotation    (Blank rows = not tested)  LOWER EXTREMITY ROM:     {AROM/PROM:27142}  Right eval Left eval  Hip flexion    Hip extension    Hip abduction    Hip adduction    Hip internal rotation    Hip external rotation    Knee flexion    Knee extension    Ankle dorsiflexion    Ankle plantarflexion    Ankle inversion    Ankle eversion     (Blank rows = not tested)  LOWER EXTREMITY MMT:    MMT Right eval Left eval  Hip flexion    Hip extension    Hip abduction    Hip adduction    Hip internal rotation    Hip external rotation    Knee flexion    Knee extension    Ankle dorsiflexion    Ankle plantarflexion    Ankle inversion    Ankle eversion     (Blank rows = not tested)  LUMBAR SPECIAL TESTS:  {lumbar special test:25242}  FUNCTIONAL TESTS:  {Functional tests:24029}  GAIT: Distance walked: *** Assistive device utilized: {Assistive devices:23999} Level of assistance: {Levels of assistance:24026} Comments: ***  TREATMENT DATE:  04/17/24: PT Eval and HEP                                                                                                                                 PATIENT EDUCATION:  Education details: PT evaluation, objective findings, POC, Importance of HEP, Precautions, Clinic policies Person educated: Patient Education method: Explanation and Demonstration Education comprehension: verbalized understanding and returned demonstration  HOME EXERCISE PROGRAM: ***  ASSESSMENT:  CLINICAL IMPRESSION: Patient is a 53 y.o. female who was seen today for physical therapy evaluation and treatment for M47.816 (ICD-10-CM) - Spondylosis without myelopathy  or radiculopathy, lumbar region M47.22 (ICD-10-CM) - Other spondylosis with radiculopathy, cervical region.   OBJECTIVE IMPAIRMENTS: {opptimpairments:25111}.   ACTIVITY LIMITATIONS:  {activitylimitations:27494}  PARTICIPATION LIMITATIONS: {participationrestrictions:25113}  PERSONAL FACTORS: {Personal factors:25162} are also affecting patient's functional outcome.   REHAB POTENTIAL: {rehabpotential:25112}  CLINICAL DECISION MAKING: {clinical decision making:25114}  EVALUATION COMPLEXITY: {Evaluation complexity:25115}   GOALS: Goals reviewed with patient? No  SHORT TERM GOALS: Target date: 05/01/24 Patient will be independent with performance of HEP to demonstrate adequate self management of symptoms.  Baseline:  Goal status: INITIAL  2.   Patient will report at least a 25% improvement with function or pain overall since beginning PT. Baseline:  Goal status: INITIAL   LONG TERM GOALS: Target date: 05/29/24 Patient will improve Oswestry score by     points in order to improve self-perceived disability and overall function.  Baseline: Goal status: INITIAL   2.  Patient will improve     score by     in order to   .  Baseline: Goal status: INITIAL   3.  Patient will improve    test to   in order to improve LE strength and endurance to return to  . Baseline:  Goal status: INITIAL   4.  Patient will gain at least    deg of AROM in     in order to improve foot clearance for safe gait mechanics Baseline:  Goal status: INITIAL   5.  Patient will report overall 50% improvement since beginning PT. Baseline:  Goal status: INITIAL   PLAN:  PT FREQUENCY: 2x/week  PT DURATION: 6 weeks  PLANNED INTERVENTIONS: 97164- PT Re-evaluation, 97110-Therapeutic exercises, 97530- Therapeutic activity, [id number]- Neuromuscular re-education, 97535- Self Care, 16109- Manual therapy, [id number]- Gait training, [phone]- Electrical stimulation (manual), [id number]- Traction (mechanical), [phone] (1-2 muscles), 20561 (3+ muscles)- Dry Needling, Patient/Family education, Balance training, Stair training, Taping, Joint mobilization, Spinal mobilization, Cryotherapy, and Moist heat.  PLAN FOR NEXT  SESSION: ***   11:18 AM, 04/16/24 Marysue Sola, PT, DPT [facility] Authorization Request  Visit Dx Codes: ***  Functional Tool Score: ***  For all possible CPT codes, reference the Planned Interventions line above.     Check all conditions that are expected to impact treatment: {Conditions expected to impact treatment:{Conditions expected to impact treatment:28273}   If treatment provided at initial evaluation, no treatment charged due to lack of authorization.

## 2024-04-17 ENCOUNTER — Telehealth (HOSPITAL_COMMUNITY): Payer: Self-pay

## 2024-04-17 ENCOUNTER — Ambulatory Visit (HOSPITAL_COMMUNITY): Payer: MEDICAID

## 2024-04-17 NOTE — Telephone Encounter (Signed)
 Called patient concerning missed PT evaluation on this date. Patient's voicemail not set up. Unable to leave message about re-scheduling at this time.   12:17 PM, 04/17/24 Lacey Bradshaw, PT, DPT [facility]

## 2024-04-19 ENCOUNTER — Ambulatory Visit (INDEPENDENT_AMBULATORY_CARE_PROVIDER_SITE_OTHER): Payer: MEDICAID | Admitting: Psychiatry

## 2024-04-19 DIAGNOSIS — F331 Major depressive disorder, recurrent, moderate: Secondary | ICD-10-CM | POA: Diagnosis not present

## 2024-04-19 NOTE — Progress Notes (Signed)
 Virtual Visit via Telephone Note  I connected with Lacey Bradshaw on 04/19/24 at 1:06 PM EDT  by telephone and verified that I am speaking with the correct person using two identifiers.  Location: Patient: Home Provider: [facility] [place] office    I discussed the limitations, risks, security and privacy concerns of performing an evaluation and management service by telephone and the availability of in person appointments. I also discussed with the patient that there may be a patient responsible charge related to this service. The patient expressed understanding and agreed to proceed.    I provided  46  minutes of non-face-to-face time during this encounter.   Dicie Foster, LCSW  THERAPIST PROGRESS NOTE  Session Time: Wednesday 04/19/2024 1:06 PM - 1:52 PM   Participation Level: Active  Behavioral Response: CasualAlertAnxious  Type of Therapy: Individual Therapy  Treatment Goals addressed: Joslyn  will score less than 5 on the Generalized Anxiety Disorder 7 Scale (GAD-7   Lean and implement 3 relaxation techniques, practice a technique daily   ProgressTowards Goals: not progressing   Interventions: CBT and Supportive  Summary: Lacey Bradshaw is a 53 y.o. female who is   is referred for services by PCP. She reports one psychiatric hospitalization in 2022 in [place] due to suicide attempt and depression.Pt reports participating in outpatient therapy in [place] and last was seen in 2017.Pt reports initially beginning to have problems with depression in 2017 when she was laid off her job.  She also began to have problems with anxiety.  Patient states often thinking about the past and says she is an over thinker.  She states difficulty sleeping and being snappy with people.  Other symptoms include poor concentration, fatigue, hopelessness, irritability muscle tension, restlessness, and worrying.  Stressors include concerns about her daughter who has overcome drug addiction  and being clean for almost 3 years.  Patient reports fear of daughter relapsing.  Patient reports additional stress regarding her situation as she does not have assisted in the source of income.  Patient has applied for disability and has been denied twice.          Patient last was seen about 6-7 weeks ago. She reports continued stress and anxiety as well as feeling overwhelmed.  She reports enjoying being on a recent cruise but immediately experiencing stress and anxiety when she returned home.  Per patient's report, of her sisters made negative comments others about the way patient has been managing their father's bills.  Patient expresses frustration and anger and has decided she will no longer do the bills but does not plan to discuss with her sister.  Patient also reports continued stress and anxiety as well as frustration regarding the relationship with her boyfriend.  She expresses disappointment and sadness as he has not helped patient the way he said he would.  He also has been untruthful to patient per her report.  She is contemplating ending the relationship but states she is a nice person and does not want to hurt his feelings.  She reports difficulty expressing her thoughts and feelings to boyfriend in a face-to-face conversation.  Patient reports she has been practicing deep breathing to cope with stress and anxiety.    Suicidal/Homicidal: Nowithout intent/plan  Therapist Response: Reviewed symptoms, discussed stressors, facilitated expression of thoughts and feelings, validated feelings, assisted patient examine her pattern of interaction with her sister as well as her boyfriend, assisted patient identify her style of communicating, began to distinguish between passive/aggressive/and assertive behaviors but/communication,  assisted patient identified the effects of her current style of communication on her thoughts/mood/behavior as well as her relationship with others, began to assist patient  identify thoughts/statements inhibiting effective assertion, began to assist patient identify basic personal rights to promote more effective assertion, praised and reinforced patient's efforts to practice deep breathing, also encouraged patient to continue writing to identify and process her feelings    Plan: Return again in 2 weeks.        Diagnosis: Major depressive disorder, recurrent episode, moderate (HCC)  Collaboration of Care: Primary Care Provider AEB patient works with PCP regarding medication management  Patient/Guardian was advised Release of Information must be obtained prior to any record release in order to collaborate their care with an outside provider. Patient/Guardian was advised if they have not already done so to contact the registration department to sign all necessary forms in order for us  to release information regarding their care.   Consent: Patient/Guardian gives verbal consent for treatment and assignment of benefits for services provided during this visit. Patient/Guardian expressed understanding and agreed to proceed.   Dicie Foster, LCSW 04/19/2024

## 2024-04-24 ENCOUNTER — Encounter: Payer: Self-pay | Admitting: Nurse Practitioner

## 2024-05-17 ENCOUNTER — Ambulatory Visit (INDEPENDENT_AMBULATORY_CARE_PROVIDER_SITE_OTHER): Payer: MEDICAID | Admitting: Psychiatry

## 2024-05-17 DIAGNOSIS — F331 Major depressive disorder, recurrent, moderate: Secondary | ICD-10-CM

## 2024-05-17 NOTE — Progress Notes (Signed)
  THERAPIST PROGRESS NOTE  Session Time: Wednesday 05/17/2024 1:16 PM - 2:00 PM   Participation Level: Active  Behavioral Response: CasualAlertAnxious  Type of Therapy: Individual Therapy  Treatment Goals addressed: Lacey Bradshaw  will score less than 5 on the Generalized Anxiety Disorder 7 Scale (GAD-7   Lean and implement 3 relaxation techniques, practice a technique daily    ProgressTowards Goals: progressing   Interventions: CBT and Supportive  Summary: Lacey  Bradshaw is a 53 y.o. female who is   is referred for services by PCP. She reports one psychiatric hospitalization in 2022 in [place] due to suicide attempt and depression.Pt reports participating in outpatient therapy in [place] and last was seen in 2017.Pt reports initially beginning to have problems with depression in 2017 when she was laid off her job.  She also began to have problems with anxiety.  Patient states often thinking about the past and says she is an over thinker.  She states difficulty sleeping and being snappy with people.  Other symptoms include poor concentration, fatigue, hopelessness, irritability muscle tension, restlessness, and worrying.  Stressors include concerns about her daughter who has overcome drug addiction and being clean for almost 3 years.  Patient reports fear of daughter relapsing.  Patient reports additional stress regarding her situation as she does not have assisted in the source of income.  Patient has applied for disability and has been denied twice.          Patient last was seen about 4-5 weeks ago. She reports continued symptoms of anxiety as reflected in the GAD-7. However, she reports being less stressed due to her increased use of assertiveness skills. She cites examples including recently breaking up with boyfriend. Pt reports being relieved since doing this but expresses frustration as boyfriend still has been trying to maintain contact. She also reports being more assertive with her  family, Per her report, she made an announcement she plans to movie into her own place by March 2026. She has reports feeling less responsible for others and feeling less guilty. She reports she has been reviewing assertiveness handouts to assist her in speaking up for self and making plans for self. She is pleased with self for initiating efforts to develop a checklist along with timeline regarding tasks/goals she wants to accomplish for self.       Suicidal/Homicidal: Nowithout intent/plan  Therapist Response: Reviewed symptoms, discussed stressors, facilitated expression of thoughts and feelings, validated feelings, praised and reinforced pt's use of assertiveness skills, discussed effects on thoughts/mood/behavior and the effects on her relationships, assisted pt identify ways to maintain limits with ex-boyfriend, discussed basic personal rights and provided pt with handout to review to assist her identify statements to promote more effective assertion,Plan: Return again in 2 weeks.        Diagnosis: Major depressive disorder, recurrent episode, moderate (HCC)  Collaboration of Care: Primary Care Provider AEB patient works with PCP regarding medication management  Patient/Guardian was advised Release of Information must be obtained prior to any record release in order to collaborate their care with an outside provider. Patient/Guardian was advised if they have not already done so to contact the registration department to sign all necessary forms in order for us  to release information regarding their care.   Consent: Patient/Guardian gives verbal consent for treatment and assignment of benefits for services provided during this visit. Patient/Guardian expressed understanding and agreed to proceed.   Winton FORBES Rubinstein, LCSW 05/17/2024

## 2024-05-18 LAB — LAB REPORT - SCANNED
A1c: 11.8
Albumin, Urine POC: 60.6
Creatinine, POC: 214.3 mg/dL
EGFR: 47
Microalb Creat Ratio: 28

## 2024-05-23 ENCOUNTER — Ambulatory Visit (HOSPITAL_COMMUNITY): Payer: MEDICAID

## 2024-05-23 NOTE — Therapy (Incomplete)
 OUTPATIENT PHYSICAL THERAPY THORACOLUMBAR EVALUATION   Patient Name: Lacey Bradshaw MRN: [id number] DOB:02-08-1971, 53 y.o., female Today's Date: 05/23/2024  END OF SESSION:   Past Medical History:  Diagnosis Date   Acid reflux    Bulging of lumbar intervertebral disc    Diabetes mellitus without complication (HCC)    borderline   Diabetes mellitus, type II (HCC)    Gastroparesis    Hyperlipidemia    Hyposomnia, insomnia or sleeplessness associated with anxiety    Migraine    Past Surgical History:  Procedure Laterality Date   ABDOMINAL HYSTERECTOMY     2009   CARPAL TUNNEL RELEASE Left    CATARACT EXTRACTION Bilateral 03/2022   FRACTURE SURGERY     jaw   TUBAL LIGATION     Patient Active Problem List   Diagnosis Date Noted   Pain in left hip 04/06/2023   Bilateral primary osteoarthritis of knee 04/06/2023   Bilateral shoulder pain 09/10/2022   Bilateral hand pain 09/10/2022   Abnormal renal function 06/17/2022   Cervical radiculopathy 06/16/2022   Ulnar neuropathy of right upper extremity 06/15/2022   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   Major depressive disorder 03/09/2022   Chronic pain syndrome 11/20/2021   Neuropathy 08/15/2021   Abnormal vision 08/15/2021   Chronic insomnia 08/15/2021   Contact dermatitis 08/15/2021   Dizziness 08/15/2021   Syncope 08/15/2021   Cataracts, bilateral 06/28/2021   Type 2 diabetes mellitus (HCC) 03/21/2021   Gastroesophageal reflux disease without esophagitis 03/21/2021   Migraine 03/21/2021   Morbid obesity (HCC) 03/21/2021   Mixed hyperlipidemia 03/21/2021   Chronic bilateral low back pain 10/17/2018   Spondylolisthesis, lumbar region 07/04/2018   Essential (primary) hypertension 07/04/2018   Carpal tunnel syndrome 06/01/2013    PCP: ***  REFERRING PROVIDER: ***  REFERRING DIAG: ***  Rationale for Evaluation and Treatment: {HABREHAB:27488}  THERAPY DIAG:  No diagnosis found.  ONSET DATE: ***  SUBJECTIVE:                                                                                                                                                                                            SUBJECTIVE STATEMENT: ***  PERTINENT HISTORY:  ***  PAIN:  Are you having pain? {OPRCPAIN:27236}  PRECAUTIONS: {Therapy precautions:24002}  RED FLAGS: {PT Red Flags:29287}   WEIGHT BEARING RESTRICTIONS: {Yes ***/No:24003}  FALLS:  Has patient fallen in last 6 months? {fallsyesno:27318}  LIVING ENVIRONMENT: Lives with: {OPRC lives with:25569::lives with their family} Lives in: {Lives in:[address]70} Stairs: {opstairs:27293} Has following equipment at home: {Assistive devices:23999}  OCCUPATION: ***  PLOF: {PLOF:24004}  PATIENT GOALS: ***  NEXT MD VISIT: ***  OBJECTIVE:  Note: Objective measures were completed at Evaluation unless otherwise noted.  DIAGNOSTIC FINDINGS:  ***  PATIENT SURVEYS:  {rehab surveys:24030}  COGNITION: Overall cognitive status: {cognition:24006}     SENSATION: {sensation:27233}  MUSCLE LENGTH: Hamstrings: Right *** deg; Left *** deg Debby test: Right *** deg; Left *** deg  POSTURE: {posture:25561}  PALPATION: ***  CERVICAL ROM:   {AROM/PROM:27142} ROM A/PROM (deg) eval  Flexion   Extension   Right lateral flexion   Left lateral flexion   Right rotation   Left rotation    (Blank rows = not tested) LUMBAR ROM:   AROM eval  Flexion   Extension   Right lateral flexion   Left lateral flexion   Right rotation   Left rotation    (Blank rows = not tested)  LOWER EXTREMITY ROM:     {AROM/PROM:27142}  Right eval Left eval  Hip flexion    Hip extension    Hip abduction    Hip adduction    Hip internal rotation    Hip external rotation    Knee flexion    Knee extension    Ankle dorsiflexion    Ankle plantarflexion    Ankle inversion    Ankle eversion     (Blank rows = not tested)  LOWER EXTREMITY MMT:    MMT Right eval  Left eval  Hip flexion    Hip extension    Hip abduction    Hip adduction    Hip internal rotation    Hip external rotation    Knee flexion    Knee extension    Ankle dorsiflexion    Ankle plantarflexion    Ankle inversion    Ankle eversion     (Blank rows = not tested)  LUMBAR SPECIAL TESTS:  {lumbar special test:25242}  FUNCTIONAL TESTS:  {Functional tests:24029}  GAIT: Distance walked: *** Assistive device utilized: {Assistive devices:23999} Level of assistance: {Levels of assistance:24026} Comments: ***  TREATMENT DATE: ***                                                                                                                                 PATIENT EDUCATION:  Education details: *** Person educated: {Person educated:25204} Education method: {Education Method:25205} Education comprehension: {Education Comprehension:25206}  HOME EXERCISE PROGRAM: ***  ASSESSMENT:  CLINICAL IMPRESSION: Patient is a *** y.o. *** who was seen today for physical therapy evaluation and treatment for ***.   OBJECTIVE IMPAIRMENTS: {opptimpairments:25111}.   ACTIVITY LIMITATIONS: {activitylimitations:27494}  PARTICIPATION LIMITATIONS: {participationrestrictions:25113}  PERSONAL FACTORS: {Personal factors:25162} are also affecting patient's functional outcome.   REHAB POTENTIAL: {rehabpotential:25112}  CLINICAL DECISION MAKING: {clinical decision making:25114}  EVALUATION COMPLEXITY: {Evaluation complexity:25115}   GOALS: Goals reviewed with patient? {yes/no:20286}  SHORT TERM GOALS: Target date: ***  *** Baseline: Goal status: INITIAL  2.  *** Baseline:  Goal status: INITIAL  3.  *** Baseline:  Goal status: INITIAL  4.  *** Baseline:  Goal  status: INITIAL  5.  *** Baseline:  Goal status: INITIAL  6.  *** Baseline:  Goal status: INITIAL  LONG TERM GOALS: Target date: ***  *** Baseline:  Goal status: INITIAL  2.  *** Baseline:  Goal  status: INITIAL  3.  *** Baseline:  Goal status: INITIAL  4.  *** Baseline:  Goal status: INITIAL  5.  *** Baseline:  Goal status: INITIAL  6.  *** Baseline:  Goal status: INITIAL  PLAN:  PT FREQUENCY: {rehab frequency:25116}  PT DURATION: {rehab duration:25117}  PLANNED INTERVENTIONS: {rehab planned interventions:25118::97110-Therapeutic exercises,97530- Therapeutic [phone]- Neuromuscular re-education,97535- Self Rjmz,02859- Manual therapy}.  PLAN FOR NEXT SESSION: ***   Greig GORMAN Quivers, PT 05/23/2024, 7:10 AM

## 2024-05-31 ENCOUNTER — Ambulatory Visit (HOSPITAL_COMMUNITY): Payer: MEDICAID | Admitting: Psychiatry

## 2024-06-14 ENCOUNTER — Ambulatory Visit (HOSPITAL_COMMUNITY): Payer: MEDICAID | Admitting: Psychiatry

## 2024-06-22 ENCOUNTER — Ambulatory Visit: Payer: MEDICAID | Admitting: Nurse Practitioner

## 2024-06-22 DIAGNOSIS — E782 Mixed hyperlipidemia: Secondary | ICD-10-CM

## 2024-06-22 DIAGNOSIS — Z794 Long term (current) use of insulin: Secondary | ICD-10-CM

## 2024-06-22 DIAGNOSIS — I10 Essential (primary) hypertension: Secondary | ICD-10-CM

## 2024-06-22 DIAGNOSIS — E1165 Type 2 diabetes mellitus with hyperglycemia: Secondary | ICD-10-CM

## 2024-06-23 ENCOUNTER — Encounter: Payer: Self-pay | Admitting: Nurse Practitioner

## 2024-06-23 ENCOUNTER — Ambulatory Visit (INDEPENDENT_AMBULATORY_CARE_PROVIDER_SITE_OTHER): Payer: MEDICAID | Admitting: Nurse Practitioner

## 2024-06-23 VITALS — BP 118/72 | HR 83 | Ht 63.0 in | Wt 201.4 lb

## 2024-06-23 DIAGNOSIS — E1165 Type 2 diabetes mellitus with hyperglycemia: Secondary | ICD-10-CM

## 2024-06-23 DIAGNOSIS — I10 Essential (primary) hypertension: Secondary | ICD-10-CM | POA: Diagnosis not present

## 2024-06-23 DIAGNOSIS — E782 Mixed hyperlipidemia: Secondary | ICD-10-CM | POA: Diagnosis not present

## 2024-06-23 DIAGNOSIS — Z794 Long term (current) use of insulin: Secondary | ICD-10-CM

## 2024-06-23 MED ORDER — VITAMIN D (ERGOCALCIFEROL) 1.25 MG (50000 UNIT) PO CAPS: 50000.0000 [IU] | ORAL_CAPSULE | ORAL | 3 refills | Status: AC

## 2024-06-23 MED ORDER — LANTUS SOLOSTAR 100 UNIT/ML ~~LOC~~ SOPN: 80.0000 [IU] | PEN_INJECTOR | Freq: Every day | SUBCUTANEOUS | 3 refills | Status: AC

## 2024-06-23 MED ORDER — DEXCOM G7 SENSOR MISC: 1.0000 | 3 refills | Status: AC

## 2024-06-23 MED ORDER — NOVOLOG FLEXPEN 100 UNIT/ML ~~LOC~~ SOPN: 15.0000 [IU] | PEN_INJECTOR | Freq: Three times a day (TID) | SUBCUTANEOUS | 3 refills | Status: DC

## 2024-06-23 NOTE — Progress Notes (Signed)
 Endocrinology Follow Up Note       06/23/2024, 8:05 AM   Subjective:    Patient ID: Lacey  Bradshaw, female    DOB: Feb 20, 1971.  Lacey  Bradshaw is being seen in follow up after being seen in consultation for management of currently uncontrolled symptomatic diabetes requested by  Shona Norleen PEDLAR, MD.   Past Medical History:  Diagnosis Date   Acid reflux    Bulging of lumbar intervertebral disc    Diabetes mellitus without complication (HCC)    borderline   Diabetes mellitus, type II (HCC)    Gastroparesis    Hyperlipidemia    Hyposomnia, insomnia or sleeplessness associated with anxiety    Migraine     Past Surgical History:  Procedure Laterality Date   ABDOMINAL HYSTERECTOMY     2009   CARPAL TUNNEL RELEASE Left    CATARACT EXTRACTION Bilateral 03/2022   FRACTURE SURGERY     jaw   TUBAL LIGATION      Social History   Socioeconomic History   Marital status: Single    Spouse name: Not on file   Number of children: Not on file   Years of education: Not on file   Highest education level: Not on file  Occupational History   Not on file  Tobacco Use   Smoking status: Never    Passive exposure: Past   Smokeless tobacco: Never  Vaping Use   Vaping status: Never Used  Substance and Sexual Activity   Alcohol use: No   Drug use: No   Sexual activity: Not Currently  Other Topics Concern   Not on file  Social History Narrative   Not on file   Social Drivers of Health   Financial Resource Strain: Low Risk  (02/08/2024)   Received from [facility]   Overall Financial Resource Strain (CARDIA)    Difficulty of Paying Living Expenses: Not very hard  Food Insecurity: No Food Insecurity (02/08/2024)   Received from [facility]   Hunger Vital Sign    Within the past 12 months, you worried that your food would run out before you got the money to buy more.: Never true    Within the past 12 months, the  food you bought just didn't last and you didn't have money to get more.: Never true  Transportation Needs: No Transportation Needs (02/08/2024)   Received from [facility] - [facility]    Lack of Transportation (Medical): No    Lack of Transportation (Non-Medical): No  Physical Activity: Insufficiently Active (06/15/2022)   Received from [facility]   Exercise Vital Sign    On average, how many days per week do you engage in moderate to strenuous exercise (like a brisk walk)?: 2 days    On average, how many minutes do you engage in exercise at this level?: 30 min  Stress: Stress Concern Present (06/15/2022)   Received from [facility] - Occupational Stress Questionnaire    Feeling of Stress : Very much  Social Connections: Unknown (06/21/2023)   Received from [facility]   Social Network    Social Network: Not on file  Family History  Problem Relation Age of Onset   Cancer Mother    Congestive Heart Failure Father    Hypertension Father    High Cholesterol Father    Stroke Father    Heart attack Father    Diabetes Sister    COPD Sister    Congestive Heart Failure Sister    Arthritis Sister    Diabetes Sister    Diabetes Sister    Depression Sister    Diabetes Sister    Diabetes Sister    Diabetes Sister    Diabetes Sister    Depression Brother    Hypertension Brother    Diabetes Brother    Healthy Daughter     Outpatient Encounter Medications as of 06/23/2024  Medication Sig   alprazolam (XANAX) 2 MG tablet Take 2 mg by mouth as needed for anxiety or sleep.   atorvastatin (LIPITOR) 80 MG tablet Take 80 mg by mouth daily.   BD VEO INSULIN  SYRINGE U/F 31G X 15/64 0.3 ML MISC ONE SYRINGE PER INSULIN  USE   Blood Glucose Monitoring Suppl (ACCU-CHEK GUIDE ME) w/Device KIT Use to check glucose 4 times daily.  Use as back up to CGM   celecoxib (CELEBREX) 200 MG capsule Take 200 mg by mouth 2 (two) times daily.    clotrimazole  (LOTRIMIN ) 1 % cream Apply to affected area 2 times daily   cyclobenzaprine  (FLEXERIL ) 5 MG tablet Take 1 tablet (5 mg total) by mouth at bedtime.   gabapentin (NEURONTIN) 300 MG capsule Take 300 mg by mouth as needed.   hydrALAZINE (APRESOLINE) 25 MG tablet Take 25 mg by mouth daily.   HYDROcodone-acetaminophen  (NORCO) 10-325 MG tablet Take 1 tablet by mouth as needed for severe pain.   insulin  aspart (NOVOLOG  FLEXPEN) 100 UNIT/ML FlexPen Inject 8-14 Units into the skin 3 (three) times daily with meals. (Patient taking differently: Inject 12-18 Units into the skin 3 (three) times daily with meals.)   insulin  glargine (LANTUS  SOLOSTAR) 100 UNIT/ML Solostar Pen Inject 30 Units into the skin at bedtime. (Patient taking differently: Inject 60 Units into the skin at bedtime.)   Insulin  Pen Needle (PEN NEEDLES) 31G X 6 MM MISC Use to inject insulin  4 times daily   omeprazole (PRILOSEC) 40 MG capsule Take 1 tablet by mouth daily.   pregabalin (LYRICA) 100 MG capsule Take 100 mg by mouth 3 (three) times daily.   vortioxetine HBr (TRINTELLIX) 5 MG TABS tablet Take 5 mg by mouth daily.   Continuous Glucose Sensor (DEXCOM G7 SENSOR) MISC Inject 1 Application into the skin as directed. Change sensor every 10 days as directed. (Patient not taking: Reported on 06/23/2024)   No facility-administered encounter medications on file as of 06/23/2024.    ALLERGIES: Allergies  Allergen Reactions   Dapagliflozin Other (See Comments)    Nausea and vomiting Pt states she is not allergic   Dulaglutide     Other reaction(s): Makes her feel terrible.   Liraglutide     Other reaction(s): burping & belching    VACCINATION STATUS: Immunization History  Administered Date(s) Administered   Zoster Recombinant(Shingrix) 03/10/2022    Diabetes She presents for her follow-up diabetic visit. She has type 2 diabetes mellitus. Onset time: diagnosed at approx age of 51. Her disease course has been worsening.  There are no hypoglycemic associated symptoms. Associated symptoms include fatigue, foot paresthesias, polydipsia, polyphagia, polyuria and visual change. Pertinent negatives for diabetes include no weight loss. There are no hypoglycemic complications. Symptoms are stable. Diabetic  complications include peripheral neuropathy. Risk factors for coronary artery disease include diabetes mellitus, dyslipidemia, hypertension, obesity, sedentary lifestyle and stress. Current diabetic treatment includes intensive insulin  program. She is compliant with treatment most of the time. Her weight is fluctuating minimally. She is following a generally healthy diet. Meal planning includes avoidance of concentrated sweets. She has not had a previous visit with a dietitian. She participates in exercise three times a week. Her home blood glucose trend is fluctuating minimally. Her breakfast blood glucose range is generally >200 mg/dl. Her lunch blood glucose range is generally >200 mg/dl. Her dinner blood glucose range is generally >200 mg/dl. Her bedtime blood glucose range is generally >200 mg/dl. Her overall blood glucose range is >200 mg/dl. (She presents today with her logs showing gross hyperglycemia overall.  Her most recent A1c, checked by PCP on 7/9 was 11.8%.  She admits she has not been eating routine meals but she does eat healthy when she does.  She takes her insulin  as prescribed.  She says insurance denied her recent request for Dexcom for unknown reasons.  She is now willing to see a nutritionist for assistance.) An ACE inhibitor/angiotensin II receptor blocker is being taken. She does not see a podiatrist.Eye exam is current (has appt for f/u next week).     Review of systems  Constitutional: + Minimally fluctuating body weight,  current Body mass index is 35.68 kg/m. , no fatigue, no subjective hyperthermia, no subjective hypothermia Eyes: no blurry vision, no xerophthalmia ENT: no sore throat, no nodules  palpated in throat, no dysphagia/odynophagia, no hoarseness Cardiovascular: no chest pain, no shortness of breath, no palpitations, no leg swelling Respiratory: no cough, no shortness of breath Gastrointestinal: no nausea/vomiting/diarrhea Musculoskeletal: no muscle/joint aches Skin: no rashes, no hyperemia Neurological: no tremors, no numbness, no tingling, no dizziness Psychiatric: no depression, no anxiety  Objective:     BP 118/72 (BP Location: Left Arm, Patient Position: Sitting, Cuff Size: Large)   Pulse 83   Ht 5' 3 (1.6 m)   Wt 201 lb 6.4 oz (91.4 kg)   BMI 35.68 kg/m   Wt Readings from Last 3 Encounters:  06/23/24 201 lb 6.4 oz (91.4 kg)  03/14/24 207 lb 6.4 oz (94.1 kg)  12/14/23 197 lb 12.8 oz (89.7 kg)     BP Readings from Last 3 Encounters:  06/23/24 118/72  03/14/24 118/78  12/14/23 114/70       Physical Exam- Limited  Constitutional:  Body mass index is 35.68 kg/m. , not in acute distress, normal state of mind Eyes:  EOMI, no exophthalmos Musculoskeletal: no gross deformities, strength intact in all four extremities, no gross restriction of joint movements Skin:  no rashes, no hyperemia Neurological: no tremor with outstretched hands   Diabetic Foot Exam - Simple   No data filed     CMP ( most recent) CMP     Component Value Date/Time   NA 129 (L) 07/20/2023 1821   NA 141 09/25/2022 0000   K 4.4 07/20/2023 1821   CL 91 (L) 07/20/2023 1821   CO2 27 07/20/2023 1821   GLUCOSE 675 (HH) 07/20/2023 1821   BUN 22 (H) 07/20/2023 1821   BUN 11 09/25/2022 0000   CREATININE 1.18 (H) 07/20/2023 1821   CREATININE 1.01 09/10/2022 0949   CALCIUM 8.5 (L) 07/20/2023 1821   PROT 7.7 07/20/2023 1821   ALBUMIN 4.2 07/20/2023 1821   AST 17 07/20/2023 1821   ALT 16 07/20/2023 1821   ALKPHOS 122 07/20/2023  1821   BILITOT 0.9 07/20/2023 1821   GFRNONAA 55 (L) 07/20/2023 1821   GFRAA 82 12/13/2020 0000     Diabetic Labs (most recent): Lab Results   Component Value Date   HGBA1C 9.8 (A) 03/14/2024   HGBA1C 9.5 (A) 01/07/2023   HGBA1C 10.0 09/25/2022   MICROALBUR 11 12/13/2020     Lipid Panel ( most recent) Lipid Panel     Component Value Date/Time   CHOL 162 09/25/2022 0000   TRIG 94 06/12/2022 0000   HDL 51 09/25/2022 0000   LDLCALC 90 09/25/2022 0000      No results found for: TSH, FREET4         Assessment & Plan:   1) Type 2 diabetes mellitus with hyperglycemia, with long-term current use of insulin  (HCC)  - Lacey Bradshaw has currently uncontrolled symptomatic type 2 DM since 53 years of age.   She presents today with her logs showing gross hyperglycemia overall.  Her most recent A1c, checked by PCP on 7/9 was 11.8%.  She admits she has not been eating routine meals but she does eat healthy when she does.  She takes her insulin  as prescribed.  She says insurance denied her recent request for Dexcom for unknown reasons.  She is now willing to see a nutritionist for assistance.  -Recent labs reviewed.  Her kidney function has taken a hit from the uncontrolled diabetes and HLD.  We talked about the importance of getting control of her health to prevent further decline.  - I had a long discussion with her about the progressive nature of diabetes and the pathology behind its complications. -her diabetes is complicated by CKD, retinopathy and neuropathy and she remains at a high risk for more acute and chronic complications which include CAD, CVA. These are all discussed in detail with her.  - Nutritional counseling repeated at each appointment due to patients tendency to fall back in to old habits.  - The patient admits there is a room for improvement in their diet and drink choices. -  Suggestion is made for the patient to avoid simple carbohydrates from their diet including Cakes, Sweet Desserts / Pastries, Ice Cream, Soda (diet and regular), Sweet Tea, Candies, Chips, Cookies, Sweet Pastries, Store Bought  Juices, Alcohol in Excess of 1-2 drinks a day, Artificial Sweeteners, Coffee Creamer, and Sugar-free Products. This will help patient to have stable blood glucose profile and potentially avoid unintended weight gain.   - I encouraged the patient to switch to unprocessed or minimally processed complex starch and increased protein intake (animal or plant source), fruits, and vegetables.   - Patient is advised to stick to a routine mealtimes to eat 3 meals a day and avoid unnecessary snacks (to snack only to correct hypoglycemia).  - she has missed several appts with Santana Duke, RDE for diabetes education.  - I have approached her with the following individualized plan to manage  her diabetes and patient agrees:   - She is advised to increase Lantus  to 80 units SQ nightly and increase Novolog  to 15-21 units TID with meals if glucose is above 90 and she is eating (Specific instructions on how to titrate insulin  dosage based on glucose readings given to patient in writing). May retry Omnipod at next visit for her as the cooler months may allow the adhesive to stick better.  -she is encouraged to continue monitoring glucose 4 times daily (using her CGM or fingersticks), before meals and before bed, and to call  the clinic if she has readings less than 70 or above 300 for 3 tests in a row.  I sent in script for Dexcom G7 to her pharmacy.  - she is warned not to take insulin  without proper monitoring per orders.  - Adjustment parameters are given to her for hypo and hyperglycemia in writing.  - she did not tolerate any incretin therapy in the past (N/V) and hx gastroparesis.  - Specific targets for  A1c;  LDL, HDL,  and Triglycerides were discussed with the patient.  2) Blood Pressure /Hypertension:  her blood pressure is controlled to target.   she is advised to continue her current medications including Lisinopril 5 mg p.o. daily with breakfast.  3) Lipids/Hyperlipidemia:    Review of her  recent lipid panel from 05/17/24 showed uncontrolled LDL at 203 .  she  is advised to continue Simvastatin 40 mg daily at bedtime (had previously stopped this as she didn't think she needed it).  Side effects and precautions discussed with her.  4)  Weight/Diet:  her Body mass index is 35.68 kg/m.  -  clearly complicating her diabetes care.   she is a candidate for weight loss. I discussed with her the fact that loss of 5 - 10% of her  current body weight will have the most impact on her diabetes management.  Exercise, and detailed carbohydrates information provided  -  detailed on discharge instructions.  5) Vitamin D  Deficiency: Her most recent vitamin D  level was 23.3 on 05/17/24.  She does not appear to be on any supplementation.  I started her on Ergocalciferol  50000 units po weekly to help boost her values.  6) Chronic Care/Health Maintenance: -she is on ACEI/ARB and Statin medications and is encouraged to initiate and continue to follow up with Ophthalmology, Dentist, Podiatrist at least yearly or according to recommendations, and advised to stay away from smoking. I have recommended yearly flu vaccine and pneumonia vaccine at least every 5 years; moderate intensity exercise for up to 150 minutes weekly; and sleep for at least 7 hours a day.  - she is advised to maintain close follow up with Shona Norleen PEDLAR, MD for primary care needs, as well as her other providers for optimal and coordinated care.     I spent  44  minutes in the care of the patient today including review of labs from CMP, Lipids, Thyroid Function, Hematology (current and previous including abstractions from other facilities); face-to-face time discussing  her blood glucose readings/logs, discussing hypoglycemia and hyperglycemia episodes and symptoms, medications doses, her options of short and long term treatment based on the latest standards of care / guidelines;  discussion about incorporating lifestyle medicine;  and  documenting the encounter. Risk reduction counseling performed per USPSTF guidelines to reduce obesity and cardiovascular risk factors.     Please refer to Patient Instructions for Blood Glucose Monitoring and Insulin /Medications Dosing Guide  in media tab for additional information. Please  also refer to  Patient Self Inventory in the Media  tab for reviewed elements of pertinent patient history.  Lacey  Bradshaw participated in the discussions, expressed understanding, and voiced agreement with the above plans.  All questions were answered to her satisfaction. she is encouraged to contact clinic should she have any questions or concerns prior to her return visit.   Follow up plan: - No follow-ups on file.  Benton Rio, [facility] [address] [place], [place] [address] Phone: [phone] Fax: [phone]  06/23/2024, 8:05  AM

## 2024-06-28 ENCOUNTER — Ambulatory Visit (HOSPITAL_COMMUNITY): Payer: MEDICAID | Admitting: Psychiatry

## 2024-06-29 ENCOUNTER — Telehealth: Payer: Self-pay

## 2024-06-29 NOTE — Telephone Encounter (Signed)
 Pharmacy Patient Advocate Encounter   Received notification from CoverMyMeds that prior authorization for Dexcom G7 sensor is required/requested.   Insurance verification completed.   The patient is insured through [employer] .   Per test claim: PA required; PA submitted to above mentioned insurance via Latent Key/confirmation #/EOC [facility] Status is pending

## 2024-07-03 NOTE — Telephone Encounter (Signed)
 Pharmacy Patient Advocate Encounter  Received notification from [facility] that Prior Authorization for Dexcom G7 sensor has been APPROVED from 06/30/24 to 12/31/24   PA #/Case ID/Reference #: [id number]

## 2024-07-04 NOTE — Telephone Encounter (Signed)
 Patient was called and made aware.

## 2024-07-12 ENCOUNTER — Ambulatory Visit (HOSPITAL_COMMUNITY): Payer: MEDICAID | Admitting: Psychiatry

## 2024-07-12 ENCOUNTER — Encounter: Payer: Self-pay | Admitting: Nurse Practitioner

## 2024-07-12 DIAGNOSIS — F331 Major depressive disorder, recurrent, moderate: Secondary | ICD-10-CM

## 2024-07-12 NOTE — Progress Notes (Signed)
 Virtual Visit via Telephone Note  I connected with Lacey  Bradshaw on 07/12/24 at 1:12 PM EDT by telephone and verified that I am speaking with the correct person using two identifiers.  Location: Patient: Home Provider: [facility] [place] office    I discussed the limitations, risks, security and privacy concerns of performing an evaluation and management service by telephone and the availability of in person appointments. I also discussed with the patient that there may be a patient responsible charge related to this service. The patient expressed understanding and agreed to proceed.    I provided 46 minutes of non-face-to-face time during this encounter.   Lacey FORBES Rubinstein, LCSW   THERAPIST PROGRESS NOTE  Session Time: Wednesday 07/12/2024 1:12 PM - 1:58 PM  Participation Level: Active  Behavioral Response: CasualAlertAnxious  Type of Therapy: Individual Therapy  Treatment Goals addressed: Lacey Bradshaw  will score less than 5 on the Generalized Anxiety Disorder 7 Scale (GAD-7   Lean and implement 3 relaxation techniques, practice a technique daily    ProgressTowards Goals: progressing   Interventions: CBT and Supportive  Summary: Lacey  Bradshaw is a 53 y.o. female who is   is referred for services by PCP. She reports one psychiatric hospitalization in 2022 in [place] due to suicide attempt and depression.Pt reports participating in outpatient therapy in [place] and last was seen in 2017.Pt reports initially beginning to have problems with depression in 2017 when she was laid off her job.  She also began to have problems with anxiety.  Patient states often thinking about the past and says she is an over thinker.  She states difficulty sleeping and being snappy with people.  Other symptoms include poor concentration, fatigue, hopelessness, irritability muscle tension, restlessness, and worrying.  Stressors include concerns about her daughter who has overcome drug addiction and  being clean for almost 3 years.  Patient reports fear of daughter relapsing.  Patient reports additional stress regarding her situation as she does not have assisted in the source of income.  Patient has applied for disability and has been denied twice.          Patient last was seen about 7-8 weeks ago. She reports increased symptoms of anxiety as reflected in the GAD-7. She reports increased stress triggered by issues with her daughter. Per pt's report, daughter has reverted to old patterns of behavior and has been missing for several weeks at a time with no contact with patient or other family members.  Patient states taking care of  her 60 year old grandson for the majority of August with no contact from the child's mother.  Patient suspects daughter has relapsed into drug use.  Patient expresses worry and concern about her grandson's welfare and emotional health as he worries about mother and her whereabouts.  She reports mother was not available when her grandson started school nor did she get any supplies or clothes for him in preparation to attend school Patient currently is residing with her grandson in her daughter's home.  Her daughter is missing again and will not respond to calls.  Patient reports stress and worry has caused her physical health to worsen.  She reports elevated glucose levels, effects on her kidney functioning, and increased numbness and pain.  Patient reports daughter is on probation. She is considering contacting her daughter's Engineer, drilling. Pt reports some relief in financial stress as she started receiving disability benefits this month.  She reports additional stress regarding her father who is experiencing increased health issues.  However, she  has used assertiveness skills more help from her sisters who have been receptive      Suicidal/Homicidal: Nowithout intent/plan  Therapist Response: Reviewed symptoms, discussed stressors, facilitated expression of thoughts and  feelings, validated feelings, praised and reinforced pt's use of assertiveness skills, assisted patient identify her priorities/explore options, assisted patient identify pros and cons of contacting probation officer, reviewed ways to try to improve self-care, reviewed relaxation techniques  Plan: Return again in 2 weeks.        Diagnosis: Major depressive disorder, recurrent episode, moderate (HCC)  Collaboration of Care: Primary Care Provider AEB patient works with PCP regarding medication management  Patient/Guardian was advised Release of Information must be obtained prior to any record release in order to collaborate their care with an outside provider. Patient/Guardian was advised if they have not already done so to contact the registration department to sign all necessary forms in order for us  to release information regarding their care.   Consent: Patient/Guardian gives verbal consent for treatment and assignment of benefits for services provided during this visit. Patient/Guardian expressed understanding and agreed to proceed.   Lacey FORBES Rubinstein, LCSW 07/12/2024

## 2024-07-24 ENCOUNTER — Encounter: Payer: Self-pay | Admitting: Nutrition

## 2024-07-24 ENCOUNTER — Encounter: Payer: MEDICAID | Attending: Nurse Practitioner | Admitting: Nutrition

## 2024-07-24 DIAGNOSIS — E118 Type 2 diabetes mellitus with unspecified complications: Secondary | ICD-10-CM | POA: Diagnosis present

## 2024-07-24 DIAGNOSIS — E1165 Type 2 diabetes mellitus with hyperglycemia: Secondary | ICD-10-CM | POA: Diagnosis present

## 2024-07-24 NOTE — Patient Instructions (Addendum)
 Goals  Eat three meals per day at times discussed 8 am, 12, 5  Test blood sugars before meals Exercise 30 minutes a day Take insulin  before meals Wait 10 seconds after injecting insulin  to allow it to absorb and not leak out. Get A1C down to 7%

## 2024-07-24 NOTE — Progress Notes (Signed)
 Medical Nutrition Therapy  Appointment Start time:  1300  Appointment End time:  1400  Primary concerns today: Dm Type 2, Obesity  Referral diagnosis: E11.65. E66.9 Preferred learning style: NO preference  Learning readiness: Ready    NUTRITION ASSESSMENT  53 yr old bfemale referred for uncontrolled Type 2 DM. I am tired of giving my shots and taking insulin .I am thinking about a insulin  pump. My problem is I miss meals and miss my insulin  doses at times. Diabetes Distress evident with an A1C of 11.8%. Sees Benton Rio, FNP at [facility]. A dexcom has been ordered and she will go get it and start using it. Complains of neuropathy in her feet. Has 2 siblings that have died from DM complications. Just got disability for bulging disc and other health issues. Has some stress in her life. Helps look after her 60 yr old father.  She is willing to work on eating meals on time and taking her insulin . Willing to try to get healthier and get some exercise.  Clinical Medical Hx:  Past Medical History:  Diagnosis Date   Acid reflux    Bulging of lumbar intervertebral disc    Diabetes mellitus without complication (HCC)    borderline   Diabetes mellitus, type II (HCC)    Gastroparesis    Hyperlipidemia    Hyposomnia, insomnia or sleeplessness associated with anxiety    Migraine     Medications:  Current Outpatient Medications on File Prior to Visit  Medication Sig Dispense Refill   alprazolam (XANAX) 2 MG tablet Take 2 mg by mouth as needed for anxiety or sleep.     atorvastatin (LIPITOR) 80 MG tablet Take 80 mg by mouth daily.     BD VEO INSULIN  SYRINGE U/F 31G X 15/64 0.3 ML MISC ONE SYRINGE PER INSULIN  USE     Blood Glucose Monitoring Suppl (ACCU-CHEK GUIDE ME) w/Device KIT Use to check glucose 4 times daily.  Use as back up to CGM 1 kit 0   celecoxib (CELEBREX) 200 MG capsule Take 200 mg by mouth 2 (two) times daily.     clotrimazole  (LOTRIMIN ) 1 % cream Apply to  affected area 2 times daily 28 g 0   Continuous Glucose Sensor (DEXCOM G7 SENSOR) MISC Inject 1 Application into the skin as directed. Change sensor every 10 days as directed. 9 each 3   cyclobenzaprine  (FLEXERIL ) 5 MG tablet Take 1 tablet (5 mg total) by mouth at bedtime. 30 tablet 2   gabapentin (NEURONTIN) 300 MG capsule Take 300 mg by mouth as needed.     hydrALAZINE (APRESOLINE) 25 MG tablet Take 25 mg by mouth daily.     HYDROcodone-acetaminophen  (NORCO) 10-325 MG tablet Take 1 tablet by mouth as needed for severe pain.     insulin  aspart (NOVOLOG  FLEXPEN) 100 UNIT/ML FlexPen Inject 15-21 Units into the skin 3 (three) times daily with meals. 75 mL 3   insulin  glargine (LANTUS  SOLOSTAR) 100 UNIT/ML Solostar Pen Inject 80 Units into the skin at bedtime. 72 mL 3   Insulin  Pen Needle (PEN NEEDLES) 31G X 6 MM MISC Use to inject insulin  4 times daily 100 each 3   omeprazole (PRILOSEC) 40 MG capsule Take 1 tablet by mouth daily.     pregabalin (LYRICA) 100 MG capsule Take 100 mg by mouth 3 (three) times daily.     Vitamin D , Ergocalciferol , (DRISDOL ) 1.25 MG (50000 UNIT) CAPS capsule Take 1 capsule (50,000 Units total) by mouth every 7 (seven) days. 12  capsule 3   vortioxetine HBr (TRINTELLIX) 5 MG TABS tablet Take 5 mg by mouth daily.     No current facility-administered medications on file prior to visit.    Labs:  Lab Results  Component Value Date   HGBA1C 9.8 (A) 03/14/2024      Latest Ref Rng & Units 07/20/2023    6:21 PM 03/23/2023    7:28 AM 09/25/2022   12:00 AM  CMP  Glucose 70 - 99 mg/dL 324  582    BUN 6 - 20 mg/dL 22  14  11       Creatinine 0.44 - 1.00 mg/dL 8.81  8.99  0.9      Sodium 135 - 145 mmol/L 129  135  141      Potassium 3.5 - 5.1 mmol/L 4.4  4.3  4.5      Chloride 98 - 111 mmol/L 91  101  102      CO2 22 - 32 mmol/L 27  24    Calcium 8.9 - 10.3 mg/dL 8.5  8.8  9.5      Total Protein 6.5 - 8.1 g/dL 7.7  6.9    Total Bilirubin 0.3 - 1.2 mg/dL 0.9  1.0     Alkaline Phos 38 - 126 U/L 122  90  79      AST 15 - 41 U/L 17  18  27       ALT 0 - 44 U/L 16  19  33         This result is from an external source.    Lipid Panel     Component Value Date/Time   CHOL 162 09/25/2022 0000   TRIG 94 06/12/2022 0000   HDL 51 09/25/2022 0000   LDLCALC 90 09/25/2022 0000    Notable Signs/Symptoms: Increased thirst, Frequent urination, yeast infections  Lifestyle & Dietary Hx Lives by herself.  Estimated daily fluid intake: 80 oz Supplements:  Sleep: poor Stress / self-care: a lot of family stress Current average weekly physical activity: ADL  24-Hr Dietary Recall Eats 1-2 meals per day Doesn't eat meals on time. Forgets her insulin  with meals at time   Estimated Energy Needs Calories: 1500 Carbohydrate: 170g Protein: 112g Fat: 42g   NUTRITION DIAGNOSIS  NB-1.1 Food and nutrition-related knowledge deficit As related to inconsistent diet and medication management.  As evidenced by A1C11.8%.   NUTRITION INTERVENTION  Nutrition education (E-1) on the following topics:  Nutrition and Diabetes education provided on My Plate, CHO counting, meal planning, portion sizes, timing of meals, avoiding snacks between meals unless having a low blood sugar, target ranges for A1C and blood sugars, signs/symptoms and treatment of hyper/hypoglycemia, monitoring blood sugars, taking medications as prescribed, benefits of exercising 30 minutes per day and prevention of complications of DM.  Lifestyle Medicine  - Whole Food, Plant Predominant Nutrition is highly recommended: Eat Plenty of vegetables, Mushrooms, fruits, Legumes, Whole Grains, Nuts, seeds in lieu of processed meats, processed snacks/pastries red meat, poultry, eggs.    -It is better to avoid simple carbohydrates including: Cakes, Sweet Desserts, Ice Cream, Soda (diet and regular), Sweet Tea, Candies, Chips, Cookies, Store Bought Juices, Alcohol in Excess of  1-2 drinks a day, Lemonade,   Artificial Sweeteners, Doughnuts, Coffee Creamers, Sugar-free Products, etc, etc.  This is not a complete list.....  Exercise: If you are able: 30 -60 minutes a day ,4 days a week, or 150 minutes a week.  The longer the better.  Combine stretch, strength,  and aerobic activities.  If you were told in the past that you have high risk for cardiovascular diseases, you may seek evaluation by your heart doctor prior to initiating moderate to intense exercise programs.  Handouts Provided Include  Lifestyle Medicine Meal Plan Card  Learning Style & Readiness for Change Teaching method utilized: Visual & Auditory  Demonstrated degree of understanding via: Teach Back  Barriers to learning/adherence to lifestyle change: none  Goals Established by Pt Goals  Eat three meals per day at times discussed 8 am, 12, 5  Test blood sugars before meals Exercise 30 minutes a day Take insulin  before meals Wait 10 seconds after injecting insulin  to allow it to absorb and not leak out. Get A1C down to 7%   MONITORING & EVALUATION Dietary intake, weekly physical activity, and blood sugars in 1 month.  Next Steps  Patient is to work on meal planning and meal prepping.SABRA

## 2024-08-08 ENCOUNTER — Ambulatory Visit (HOSPITAL_COMMUNITY): Payer: MEDICAID | Admitting: Psychiatry

## 2024-08-11 ENCOUNTER — Ambulatory Visit: Payer: MEDICAID | Admitting: Podiatry

## 2024-08-21 ENCOUNTER — Encounter: Payer: MEDICAID | Attending: Nurse Practitioner | Admitting: Nutrition

## 2024-08-21 ENCOUNTER — Encounter: Payer: Self-pay | Admitting: Nutrition

## 2024-08-21 VITALS — Ht 63.0 in | Wt 208.0 lb

## 2024-08-21 DIAGNOSIS — E118 Type 2 diabetes mellitus with unspecified complications: Secondary | ICD-10-CM | POA: Insufficient documentation

## 2024-08-21 DIAGNOSIS — E1165 Type 2 diabetes mellitus with hyperglycemia: Secondary | ICD-10-CM | POA: Diagnosis present

## 2024-08-21 NOTE — Progress Notes (Signed)
 Medical Nutrition Therapy  Appointment Start time:   1510 Appointment End time:  1610  Primary concerns today: Dm Type 2, Obesity  Referral diagnosis: E11.65. E66.9 Preferred learning style: NO preference  Learning readiness: Ready    NUTRITION ASSESSMENT  Gained 7 lbs.  Hasn't been wearing Dexcom as regularly. Decom shows 8% TIR, 21% high and 71% very hight. GMI 10.3%. Struggles with doing her meals and medications. Not eating meals on time nor taking medications as prescribed. Has been caring and doing things for other people.  Wt Readings from Last 3 Encounters:  08/21/24 208 lb (94.3 kg)  06/23/24 201 lb 6.4 oz (91.4 kg)  03/14/24 207 lb 6.4 oz (94.1 kg)   Ht Readings from Last 3 Encounters:  08/21/24 5' 3 (1.6 m)  06/23/24 5' 3 (1.6 m)  03/14/24 5' 3 (1.6 m)   Body mass index is 36.85 kg/m. @BMIFA @ Facility age limit for growth %iles is 20 years. Facility age limit for growth %iles is 20 years.  Clinical Medical Hx:  Past Medical History:  Diagnosis Date   Acid reflux    Bulging of lumbar intervertebral disc    Diabetes mellitus without complication (HCC)    borderline   Diabetes mellitus, type II (HCC)    Gastroparesis    Hyperlipidemia    Hyposomnia, insomnia or sleeplessness associated with anxiety    Migraine     Medications:  Current Outpatient Medications on File Prior to Visit  Medication Sig Dispense Refill   alprazolam (XANAX) 2 MG tablet Take 2 mg by mouth as needed for anxiety or sleep.     atorvastatin (LIPITOR) 80 MG tablet Take 80 mg by mouth daily.     BD VEO INSULIN  SYRINGE U/F 31G X 15/64 0.3 ML MISC ONE SYRINGE PER INSULIN  USE     Blood Glucose Monitoring Suppl (ACCU-CHEK GUIDE ME) w/Device KIT Use to check glucose 4 times daily.  Use as back up to CGM 1 kit 0   celecoxib (CELEBREX) 200 MG capsule Take 200 mg by mouth 2 (two) times daily.     clotrimazole  (LOTRIMIN ) 1 % cream Apply to affected area 2 times daily 28 g 0   Continuous  Glucose Sensor (DEXCOM G7 SENSOR) MISC Inject 1 Application into the skin as directed. Change sensor every 10 days as directed. 9 each 3   cyclobenzaprine  (FLEXERIL ) 5 MG tablet Take 1 tablet (5 mg total) by mouth at bedtime. 30 tablet 2   gabapentin (NEURONTIN) 300 MG capsule Take 300 mg by mouth as needed.     hydrALAZINE (APRESOLINE) 25 MG tablet Take 25 mg by mouth daily.     HYDROcodone-acetaminophen  (NORCO) 10-325 MG tablet Take 1 tablet by mouth as needed for severe pain.     insulin  aspart (NOVOLOG  FLEXPEN) 100 UNIT/ML FlexPen Inject 15-21 Units into the skin 3 (three) times daily with meals. 75 mL 3   insulin  glargine (LANTUS  SOLOSTAR) 100 UNIT/ML Solostar Pen Inject 80 Units into the skin at bedtime. 72 mL 3   Insulin  Pen Needle (PEN NEEDLES) 31G X 6 MM MISC Use to inject insulin  4 times daily 100 each 3   omeprazole (PRILOSEC) 40 MG capsule Take 1 tablet by mouth daily.     pregabalin (LYRICA) 100 MG capsule Take 100 mg by mouth 3 (three) times daily.     Vitamin D , Ergocalciferol , (DRISDOL ) 1.25 MG (50000 UNIT) CAPS capsule Take 1 capsule (50,000 Units total) by mouth every 7 (seven) days. 12 capsule 3  vortioxetine HBr (TRINTELLIX) 5 MG TABS tablet Take 5 mg by mouth daily.     No current facility-administered medications on file prior to visit.    Labs:  Lab Results  Component Value Date   HGBA1C 9.8 (A) 03/14/2024      Latest Ref Rng & Units 07/20/2023    6:21 PM 03/23/2023    7:28 AM 09/25/2022   12:00 AM  CMP  Glucose 70 - 99 mg/dL 324  582    BUN 6 - 20 mg/dL 22  14  11       Creatinine 0.44 - 1.00 mg/dL 8.81  8.99  0.9      Sodium 135 - 145 mmol/L 129  135  141      Potassium 3.5 - 5.1 mmol/L 4.4  4.3  4.5      Chloride 98 - 111 mmol/L 91  101  102      CO2 22 - 32 mmol/L 27  24    Calcium 8.9 - 10.3 mg/dL 8.5  8.8  9.5      Total Protein 6.5 - 8.1 g/dL 7.7  6.9    Total Bilirubin 0.3 - 1.2 mg/dL 0.9  1.0    Alkaline Phos 38 - 126 U/L 122  90  79      AST 15 -  41 U/L 17  18  27       ALT 0 - 44 U/L 16  19  33         This result is from an external source.    Lipid Panel     Component Value Date/Time   CHOL 162 09/25/2022 0000   TRIG 94 06/12/2022 0000   HDL 51 09/25/2022 0000   LDLCALC 90 09/25/2022 0000    Notable Signs/Symptoms: Increased thirst, Frequent urination, yeast infections  Lifestyle & Dietary Hx Lives by herself.  Estimated daily fluid intake: 80 oz Supplements:  Sleep: poor Stress / self-care: a lot of family stress Current average weekly physical activity: ADL  24-Hr Dietary Recall Spinach with egg whites, tomatoes, umsweetened apple sauce and 1/2 slice wheat toast Skipped Pintos 1/2 c turkey breast,  spinach,  water  Estimated Energy Needs Calories: 1500 Carbohydrate: 170g Protein: 112g Fat: 42g   NUTRITION DIAGNOSIS  NB-1.1 Food and nutrition-related knowledge deficit As related to inconsistent diet and medication management.  As evidenced by A1C11.8%.   NUTRITION INTERVENTION  Nutrition education (E-1) on the following topics:  Nutrition and Diabetes education provided on My Plate, CHO counting, meal planning, portion sizes, timing of meals, avoiding snacks between meals unless having a low blood sugar, target ranges for A1C and blood sugars, signs/symptoms and treatment of hyper/hypoglycemia, monitoring blood sugars, taking medications as prescribed, benefits of exercising 30 minutes per day and prevention of complications of DM.  Lifestyle Medicine  - Whole Food, Plant Predominant Nutrition is highly recommended: Eat Plenty of vegetables, Mushrooms, fruits, Legumes, Whole Grains, Nuts, seeds in lieu of processed meats, processed snacks/pastries red meat, poultry, eggs.    -It is better to avoid simple carbohydrates including: Cakes, Sweet Desserts, Ice Cream, Soda (diet and regular), Sweet Tea, Candies, Chips, Cookies, Store Bought Juices, Alcohol in Excess of  1-2 drinks a day, Lemonade,  Artificial  Sweeteners, Doughnuts, Coffee Creamers, Sugar-free Products, etc, etc.  This is not a complete list.....  Exercise: If you are able: 30 -60 minutes a day ,4 days a week, or 150 minutes a week.  The longer the better.  Combine stretch,  strength, and aerobic activities.  If you were told in the past that you have high risk for cardiovascular diseases, you may seek evaluation by your heart doctor prior to initiating moderate to intense exercise programs.  Handouts Provided Include  Lifestyle Medicine Meal Plan Card  Learning Style & Readiness for Change Teaching method utilized: Visual & Auditory  Demonstrated degree of understanding via: Teach Back  Barriers to learning/adherence to lifestyle change: none  Goals Established by Pt  Eat three meals per day Take insulin  before meals Check blood sugar before meals Wear dexcom Call [facility] when blood sugars over 300 mg/dl 3 times per week or over daily.   MONITORING & EVALUATION Dietary intake, weekly physical activity, and blood sugars in 1 month.  Next Steps  Patient is to work on meal planning and meal prepping.SABRA

## 2024-08-21 NOTE — Patient Instructions (Signed)
 Goals  Eat three meals per day Take insulin  before meals Check blood sugar before meals Wear dexcom Call [facility] when blood sugars over 300 mg/dl 3 times per week or over daily.

## 2024-08-22 ENCOUNTER — Encounter: Payer: Self-pay | Admitting: Nurse Practitioner

## 2024-08-25 ENCOUNTER — Ambulatory Visit: Payer: MEDICAID | Admitting: Podiatry

## 2024-08-26 ENCOUNTER — Other Ambulatory Visit: Payer: Self-pay | Admitting: Nurse Practitioner

## 2024-09-04 ENCOUNTER — Encounter: Payer: MEDICAID | Admitting: Nutrition

## 2024-09-04 ENCOUNTER — Encounter: Payer: Self-pay | Admitting: Nutrition

## 2024-09-05 ENCOUNTER — Ambulatory Visit (HOSPITAL_COMMUNITY): Payer: MEDICAID | Admitting: Psychiatry

## 2024-09-19 ENCOUNTER — Ambulatory Visit (INDEPENDENT_AMBULATORY_CARE_PROVIDER_SITE_OTHER): Payer: MEDICAID | Admitting: Psychiatry

## 2024-09-19 DIAGNOSIS — F331 Major depressive disorder, recurrent, moderate: Secondary | ICD-10-CM

## 2024-09-19 NOTE — Progress Notes (Signed)
 Virtual Visit via Video Note  I connected with Lacey  Bradshaw on 09/19/24 at 1:05 PM EST by a video enabled telemedicine application and verified that I am speaking with the correct person using two identifiers.  Location: Patient: Home  Provider: [facility] Outpatient [place] office    I discussed the limitations of evaluation and management by telemedicine and the availability of in person appointments. The patient expressed understanding and agreed to proceed.   I provided 49 minutes of non-face-to-face time during this encounter.   Winton FORBES Rubinstein, LCSW    THERAPIST PROGRESS NOTE  Session Time: Tuesday 09/19/2024 1:05 PM - 1:53 AM   Participation Level: Active  Behavioral Response: CasualAlertAnxious  Type of Therapy: Individual Therapy  Treatment Goals addressed: Kandis  will score less than 5 on the Generalized Anxiety Disorder 7 Scale (GAD-7   Lean and implement 3 relaxation techniques, practice a technique daily    ProgressTowards Goals: progressing   Interventions: CBT and Supportive  Summary: Lacey  Bradshaw is a 53 y.o. female who is   is referred for services by PCP. She reports one psychiatric hospitalization in 2022 in [place] due to suicide attempt and depression.Pt reports participating in outpatient therapy in [place] and last was seen in 2017.Pt reports initially beginning to have problems with depression in 2017 when she was laid off her job.  She also began to have problems with anxiety.  Patient states often thinking about the past and says she is an over thinker.  She states difficulty sleeping and being snappy with people.  Other symptoms include poor concentration, fatigue, hopelessness, irritability muscle tension, restlessness, and worrying.  Stressors include concerns about her daughter who has overcome drug addiction and being clean for almost 3 years.  Patient reports fear of daughter relapsing.  Patient reports additional stress regarding her  situation as she does not have assisted in the source of income.  Patient has applied for disability and has been denied twice.          Patient last was seen about 7-8 weeks ago. She reports continued symptoms of anxiety as reflected in the GAD-7. She reports continued stress triggered by issues with her daughter. Per pt's report, daughter continues old patterns of behavior missing for several weeks at a time with no contact with patient or other family members.  Patient continues to try to provide care for  her 51 year old grandson when mother is missing.  He is not doing well in school and patient worries about his welfare.  Pt reports additional worry as daughter recently got into more legal trouble and may be facing jail time.  Patient also reports stress regarding her relationship with her boyfriend and expresses ambivalent feelings about remaining in the relationship.  She also continues to worry about her father but reports sisters have been more involved in his care.  Patient reports constant rumination.      Suicidal/Homicidal: Nowithout intent/plan  Therapist Response: Reviewed symptoms, discussed stressors, facilitated expression of thoughts and feelings, validated feelings, assisted patient prioritize her worries, assisted patient identify realistic expectations of self as well as realistic expectations regarding her daughter's behavior, assisted patient identify areas within her control and ways she can make healthy changes, discussed rationale for and provided patient with instructions on how to use the leaves on a strain exercise to cope with ruminating thoughts, checked out interactive audio and provided patient with access code, developed plan with patient to practice exercise daily, also developed plan with patient to continue practicing deep breathing  as a relaxation technique, also begin to explore ways patient can nurture self  Plan: Return again in 2 weeks.        Diagnosis: Major  depressive disorder, recurrent episode, moderate (HCC)  Collaboration of Care: Primary Care Provider AEB patient works with PCP regarding medication management  Patient/Guardian was advised Release of Information must be obtained prior to any record release in order to collaborate their care with an outside provider. Patient/Guardian was advised if they have not already done so to contact the registration department to sign all necessary forms in order for us  to release information regarding their care.   Consent: Patient/Guardian gives verbal consent for treatment and assignment of benefits for services provided during this visit. Patient/Guardian expressed understanding and agreed to proceed.   Winton FORBES Rubinstein, LCSW 09/19/2024

## 2024-09-25 LAB — HEMOGLOBIN A1C: Hemoglobin A1C: 13

## 2024-09-25 LAB — LIPID PANEL
LDL Cholesterol: 154
Triglycerides: 141 (ref 40–160)

## 2024-09-25 LAB — HEPATIC FUNCTION PANEL: Alkaline Phosphatase: 152 — AB (ref 25–125)

## 2024-09-25 LAB — MICROALBUMIN / CREATININE URINE RATIO: Microalb Creat Ratio: 16

## 2024-09-25 LAB — BASIC METABOLIC PANEL WITH GFR
BUN: 16 (ref 4–21)
Creatinine: 1.3 — AB (ref 0.5–1.1)

## 2024-09-25 LAB — COMPREHENSIVE METABOLIC PANEL WITH GFR: eGFR: 51

## 2024-09-28 ENCOUNTER — Encounter: Payer: Self-pay | Admitting: Nurse Practitioner

## 2024-09-28 ENCOUNTER — Encounter: Payer: MEDICAID | Admitting: Nurse Practitioner

## 2024-09-28 DIAGNOSIS — E1165 Type 2 diabetes mellitus with hyperglycemia: Secondary | ICD-10-CM

## 2024-09-28 DIAGNOSIS — I10 Essential (primary) hypertension: Secondary | ICD-10-CM

## 2024-09-28 DIAGNOSIS — Z794 Long term (current) use of insulin: Secondary | ICD-10-CM

## 2024-09-28 DIAGNOSIS — E782 Mixed hyperlipidemia: Secondary | ICD-10-CM

## 2024-09-29 NOTE — Progress Notes (Signed)
 Erroneous encounter

## 2024-10-08 ENCOUNTER — Other Ambulatory Visit: Payer: Self-pay | Admitting: Nurse Practitioner

## 2024-10-19 ENCOUNTER — Ambulatory Visit (INDEPENDENT_AMBULATORY_CARE_PROVIDER_SITE_OTHER): Payer: MEDICAID | Admitting: Psychiatry

## 2024-10-19 DIAGNOSIS — F331 Major depressive disorder, recurrent, moderate: Secondary | ICD-10-CM
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
## 2024-10-27 ENCOUNTER — Other Ambulatory Visit: Payer: Self-pay | Admitting: Medical Genetics

## 2024-11-09 ENCOUNTER — Emergency Department (HOSPITAL_COMMUNITY): Payer: MEDICAID

## 2024-11-09 ENCOUNTER — Emergency Department (HOSPITAL_COMMUNITY)
Admission: EM | Admit: 2024-11-09 | Discharge: 2024-11-09 | Disposition: A | Discharge status: Home / Self Care | Payer: MEDICAID | Attending: Emergency Medicine | Admitting: Emergency Medicine

## 2024-11-09 ENCOUNTER — Encounter (HOSPITAL_COMMUNITY): Payer: Self-pay | Admitting: Emergency Medicine

## 2024-11-09 ENCOUNTER — Other Ambulatory Visit: Payer: Self-pay

## 2024-11-09 DIAGNOSIS — R55 Syncope and collapse: Secondary | ICD-10-CM | POA: Diagnosis not present

## 2024-11-09 DIAGNOSIS — I10 Essential (primary) hypertension: Secondary | ICD-10-CM | POA: Insufficient documentation

## 2024-11-09 DIAGNOSIS — E119 Type 2 diabetes mellitus without complications: Secondary | ICD-10-CM | POA: Insufficient documentation

## 2024-11-09 DIAGNOSIS — Z794 Long term (current) use of insulin: Secondary | ICD-10-CM | POA: Diagnosis not present

## 2024-11-09 DIAGNOSIS — S80211A Abrasion, right knee, initial encounter: Secondary | ICD-10-CM | POA: Insufficient documentation

## 2024-11-09 DIAGNOSIS — R002 Palpitations: Secondary | ICD-10-CM

## 2024-11-09 DIAGNOSIS — R7989 Other specified abnormal findings of blood chemistry: Secondary | ICD-10-CM | POA: Diagnosis not present

## 2024-11-09 DIAGNOSIS — R42 Dizziness and giddiness: Secondary | ICD-10-CM

## 2024-11-09 DIAGNOSIS — W228XXA Striking against or struck by other objects, initial encounter: Secondary | ICD-10-CM | POA: Insufficient documentation

## 2024-11-09 DIAGNOSIS — Z79899 Other long term (current) drug therapy: Secondary | ICD-10-CM | POA: Diagnosis not present

## 2024-11-09 DIAGNOSIS — S8991XA Unspecified injury of right lower leg, initial encounter: Secondary | ICD-10-CM | POA: Diagnosis present

## 2024-11-09 LAB — CBC
HCT: 39.3 % (ref 36.0–46.0)
Hemoglobin: 13.8 g/dL (ref 12.0–15.0)
MCH: 29.4 pg (ref 26.0–34.0)
MCHC: 35.1 g/dL (ref 30.0–36.0)
MCV: 83.6 fL (ref 80.0–100.0)
Platelets: 248 K/uL (ref 150–400)
RBC: 4.7 MIL/uL (ref 3.87–5.11)
RDW: 12.5 % (ref 11.5–15.5)
WBC: 8.8 K/uL (ref 4.0–10.5)
nRBC: 0 % (ref 0.0–0.2)

## 2024-11-09 LAB — D-DIMER, QUANTITATIVE: D-Dimer, Quant: 1.13 ug{FEU}/mL — ABNORMAL HIGH (ref 0.00–0.50)

## 2024-11-09 LAB — URINALYSIS, ROUTINE W REFLEX MICROSCOPIC
Bilirubin Urine: NEGATIVE
Glucose, UA: 150 mg/dL — AB
Hgb urine dipstick: NEGATIVE
Ketones, ur: NEGATIVE mg/dL
Leukocytes,Ua: NEGATIVE
Nitrite: NEGATIVE
Protein, ur: NEGATIVE mg/dL
Specific Gravity, Urine: 1.043 — ABNORMAL HIGH (ref 1.005–1.030)
pH: 7 (ref 5.0–8.0)

## 2024-11-09 LAB — BASIC METABOLIC PANEL WITH GFR
Anion gap: 16 — ABNORMAL HIGH (ref 5–15)
BUN: 16 mg/dL (ref 6–20)
CO2: 24 mmol/L (ref 22–32)
Calcium: 9.7 mg/dL (ref 8.9–10.3)
Chloride: 101 mmol/L (ref 98–111)
Creatinine, Ser: 1.23 mg/dL — ABNORMAL HIGH (ref 0.44–1.00)
GFR, Estimated: 52 mL/min — ABNORMAL LOW
Glucose, Bld: 91 mg/dL (ref 70–99)
Potassium: 4.2 mmol/L (ref 3.5–5.1)
Sodium: 142 mmol/L (ref 135–145)

## 2024-11-09 LAB — TROPONIN T, HIGH SENSITIVITY
Troponin T High Sensitivity: <15 ng/L (ref 0–19)
Troponin T High Sensitivity: <15 ng/L (ref 0–19)

## 2024-11-09 LAB — CBG MONITORING, ED
Glucose-Capillary: 133 mg/dL — ABNORMAL HIGH (ref 70–99)
Glucose-Capillary: 141 mg/dL — ABNORMAL HIGH (ref 70–99)

## 2024-11-09 MED ORDER — LACTATED RINGERS IV BOLUS
1000.0000 mL | Freq: Once | INTRAVENOUS | Status: AC
  Administered 2024-11-09: 1000 mL via INTRAVENOUS

## 2024-11-09 MED ORDER — IOHEXOL 350 MG/ML SOLN
75.0000 mL | Freq: Once | INTRAVENOUS | Status: AC | PRN
  Administered 2024-11-09: 75 mL via INTRAVENOUS

## 2024-11-09 NOTE — ED Triage Notes (Signed)
 Pt c/o of 2 syncopal episodes. One last night and one this morning. Pt is diabetic and says her cbgs are usually high but dropped this morning to 126 which she states is low for her. C/o of feeling shaky and off. Pt is not aware if she hit her head. Denies blood thinners. C/o of right leg pain, and rib / back pain.

## 2024-11-09 NOTE — ED Notes (Addendum)
 Pt states she does not feel like she can stand long enough to either walk to bathroom or do Orthostatic VS. She states her glucose is too low at 108 because she is used to her meter reading high or over 600.SABRA Explained to patient that glucose that high is NOT a good thing. Also, EDP informed and requested to give pt something to drink. Gave pt orange juice and will check her glucose again shortly and ask again about providing urine and Orthostatic VS.

## 2024-11-09 NOTE — Discharge Instructions (Addendum)
 You are seen in the emergency room today for evaluation of an episode of passing out, the episode this morning was unlabored your sugar was much lower than usual.  We are glad you are feeling better.  Since you have been having palpitations would like you to follow-up with cardiology for possible cardiac monitoring for further evaluation.  Please follow close with your primary care doctor as well quadrant plan of fluids and rest, come back to the ER if you have new or worsening symptoms.

## 2024-11-09 NOTE — ED Provider Notes (Signed)
 " [place] EMERGENCY DEPARTMENT AT [facility] Provider Note   CSN: [id number] Arrival date & time: 11/09/24  1033     Patient presents with: Loss of Consciousness   Lacey  Bradshaw is a 54 y.o. female. Past medical history of uncontrolled type 2 diabetes on insulin , GERD, hypertension, neuropathy, hyperlipidemia, migraines, gastroparesis.  She presents to ER today for evaluation of syncope.  She reports that she has had a slight cough and shortness of breath for the past week, she had gone to urgent care had negative flu and COVID testing and was given Bromfed and a Z-Pak, she states that she has been continually coughing though this seems to improved today.  Last night she reports she was sitting down on her couch and felt somewhat funny, woke up on the ground, states she had hit her right knee and hip on the ground.  She is not sure if she hit her head as she was unconscious but denies any headache or tenderness to her head.  She reports her neighbor was at her house and heard her fall and came and was able to help her up.  She is not having any palpitations or chest pain or shortness of breath prior to this episode.  She states she checked her blood sugar after this and her meter read high which is not unusual for her.  She sliding-scale insulin  and states it did not help so she took more.  Patient states she thinks she took too much insulin  because she woke up this morning early feeling shaky and sweaty and thinks she had her blood sugar drop.  She states she got up out of bed and fell onto some pillows next to the bed, and believes she had passed out again. She states she remembers waking up and wondering if she had fallen asleep on the pillows, but believes she may have passed out.  This could be related to the blood sugar, states her blood sugar was 126 when she checked it, she states for her that is very low and she often gets symptoms with this.  She does complain of some right  knee pain from last night but denies any swelling, she able to bear full weight and ambulate but has a mild abrasion.      Loss of Consciousness      Prior to Admission medications  Medication Sig Start Date End Date Taking? Authorizing Provider  alprazolam (XANAX) 2 MG tablet Take 2 mg by mouth as needed for anxiety or sleep.    [provider]  atorvastatin (LIPITOR) 80 MG tablet Take 80 mg by mouth daily.    [provider]  BD VEO INSULIN  SYRINGE U/F 31G X 15/64 0.3 ML MISC ONE SYRINGE PER INSULIN  USE 12/18/20   [provider]  Blood Glucose Monitoring Suppl (ACCU-CHEK GUIDE ME) w/Device KIT Use to check glucose 4 times daily.  Use as back up to CGM 12/31/23   Therisa Benton PARAS, NP  celecoxib (CELEBREX) 200 MG capsule Take 200 mg by mouth 2 (two) times daily. 10/22/22   [provider]  clotrimazole  (LOTRIMIN ) 1 % cream Apply to affected area 2 times daily 07/20/23   Freddi Hamilton, MD  Continuous Glucose Sensor (DEXCOM G7 SENSOR) MISC Inject 1 Application into the skin as directed. Change sensor every 10 days as directed. 06/23/24   Therisa Benton PARAS, NP  cyclobenzaprine  (FLEXERIL ) 5 MG tablet Take 1 tablet (5 mg total) by mouth at bedtime. 04/06/23  Jeannetta Lonni ORN, MD  gabapentin (NEURONTIN) 300 MG capsule Take 300 mg by mouth as needed. 08/25/22   [provider]  hydrALAZINE (APRESOLINE) 25 MG tablet Take 25 mg by mouth daily. 06/18/22   [provider]  HYDROcodone-acetaminophen  (NORCO) 10-325 MG tablet Take 1 tablet by mouth as needed for severe pain. 10/27/22   [provider]  Insulin  Aspart FlexPen (NOVOLOG ) 100 UNIT/ML Inject 15-21 Units into the skin 3 (three) times daily before meals. 08/29/24   Therisa Benton PARAS, NP  insulin  glargine (LANTUS  SOLOSTAR) 100 UNIT/ML Solostar Pen Inject 80 Units into the skin at bedtime. 06/23/24   Therisa Benton PARAS, NP  Insulin  Pen Needle (PEN NEEDLES) 31G X 6 MM MISC Use to  inject insulin  4 times daily 03/14/24   Therisa Benton PARAS, NP  omeprazole (PRILOSEC) 40 MG capsule Take 1 tablet by mouth daily. 05/24/23   [provider]  pregabalin (LYRICA) 100 MG capsule Take 100 mg by mouth 3 (three) times daily. 02/05/20   [provider]  Vitamin D , Ergocalciferol , (DRISDOL ) 1.25 MG (50000 UNIT) CAPS capsule Take 1 capsule (50,000 Units total) by mouth every 7 (seven) days. 06/23/24   Therisa Benton PARAS, NP  vortioxetine HBr (TRINTELLIX) 5 MG TABS tablet Take 5 mg by mouth daily. 09/29/22   [provider]    Allergies: Dapagliflozin, Dulaglutide, and Liraglutide    Review of Systems  Cardiovascular:  Positive for syncope.    Updated Vital Signs BP 117/75   Pulse 96   Temp 98.5 F (36.9 C) (Oral)   Ht 5' 3 (1.6 m)   Wt 91.2 kg   SpO2 96%   BMI 35.61 kg/m   Physical Exam Vitals and nursing note reviewed.  Constitutional:      General: She is not in acute distress.    Appearance: She is well-developed.  HENT:     Head: Normocephalic and atraumatic.     Mouth/Throat:     Mouth: Mucous membranes are moist.  Eyes:     Extraocular Movements: Extraocular movements intact.     Conjunctiva/sclera: Conjunctivae normal.     Pupils: Pupils are equal, round, and reactive to light.  Cardiovascular:     Rate and Rhythm: Normal rate and regular rhythm.     Heart sounds: No murmur heard. Pulmonary:     Effort: Pulmonary effort is normal. No respiratory distress.     Breath sounds: Normal breath sounds.  Abdominal:     Palpations: Abdomen is soft.     Tenderness: There is no abdominal tenderness.  Musculoskeletal:        General: No swelling.     Cervical back: Neck supple.  Skin:    General: Skin is warm and dry.     Capillary Refill: Capillary refill takes less than 2 seconds.  Neurological:     General: No focal deficit present.     Mental Status: She is alert and oriented to person, place, and time.     Sensory: No sensory  deficit.     Motor: No weakness.     Coordination: Coordination normal.     Gait: Gait normal.  Psychiatric:        Mood and Affect: Mood normal.     (all labs ordered are listed, but only abnormal results are displayed) Labs Reviewed - No data to display  EKG: None  Radiology: No results found.   Procedures   Medications Ordered in the ED - No data to display  Medical Decision Making Differential diagnosis includes but not limited to dehydration, orthostatic hypotension, arrhythmia, CVA, electrolyte disturbance, hypoglycemia, other  Course: Patient presents ER today for evaluation of syncope, she had episode last night, unsure of the circumstances and did not have any chest pain or shortness of breath with this but this is a brief episode, her neighbor helped her up off the floor.  She had a mild abrasion to the right knee but is ambulatory, no other bony tenderness on exam she has mild right rib soreness as well, x-ray shows no pneumothorax, she had another syncopal episode this morning that was accompanied with shaking and sweating, patient states she checked her sugar and it was much lower than normal, she states she took more insulin  last night because her sugar was reading high, she reports her sugars are often in the 600s, I think she was to inpatient last night and took too much insulin , sugar today was in the 120s but she states it is very low for her.  She believes this led to the episode of syncope this morning.  After orange juice and fluids here she is feeling much better, since she had 2 syncopal episodes and has been complaining of some potation's I did do edema her which was positive, PE study resultantly was negative for PE or other acute abnormality.  Patient did have a URI about a week ago was on a Z-Pak and taken cough medicine and thinks that she may have been dehydrated from being sick due to decreased p.o. intake.  It was for  fluids, orthostatics and discharged home for PCP follow-up and cardiology follow-up for Zio patch due to the palpitations.  Patient was also seen by Dr. Jakie.  Patient she is feeling much better, requesting to go, she was agreeable to stay to finish her IV fluids, ambulated to the bathroom without difficulty after finishing fluids and is feeling much better.  Labs showed normal CBC, BMP with baseline creatinine, slightly increased anion gap at 16, D-dimer elevated 1.13, troponin negative, glucose on labs was 91, after orange juice is 141 on Accu-Chek.  UA showed increased specific gravity but otherwise no infection or other abnormalities.  Chest x-ray showed no pneumonia, no pulmonary edema, no pneumothorax or obviously displaced rib fractures   Cardiac monitoring showed sinus rhythm per my interpretation.  Amount and/or Complexity of Data Reviewed Labs: ordered. Radiology: ordered.  Risk Prescription drug management.        Final diagnoses:  None    ED Discharge Orders     None          Suellen Sherran DELENA DEVONNA 11/09/24 1710    Yolande Lamar BROCKS, MD 11/17/24 1734  "

## 2024-11-11 ENCOUNTER — Emergency Department (HOSPITAL_COMMUNITY): Payer: MEDICAID

## 2024-11-11 ENCOUNTER — Other Ambulatory Visit: Payer: Self-pay

## 2024-11-11 ENCOUNTER — Emergency Department (HOSPITAL_COMMUNITY)
Admission: EM | Admit: 2024-11-11 | Discharge: 2024-11-11 | Disposition: A | Discharge status: Home / Self Care | Payer: MEDICAID | Attending: Emergency Medicine | Admitting: Emergency Medicine

## 2024-11-11 DIAGNOSIS — J069 Acute upper respiratory infection, unspecified: Secondary | ICD-10-CM | POA: Insufficient documentation

## 2024-11-11 DIAGNOSIS — E119 Type 2 diabetes mellitus without complications: Secondary | ICD-10-CM | POA: Diagnosis not present

## 2024-11-11 DIAGNOSIS — Z794 Long term (current) use of insulin: Secondary | ICD-10-CM | POA: Diagnosis not present

## 2024-11-11 DIAGNOSIS — I10 Essential (primary) hypertension: Secondary | ICD-10-CM | POA: Diagnosis not present

## 2024-11-11 DIAGNOSIS — Z79899 Other long term (current) drug therapy: Secondary | ICD-10-CM | POA: Diagnosis not present

## 2024-11-11 DIAGNOSIS — R059 Cough, unspecified: Secondary | ICD-10-CM | POA: Diagnosis present

## 2024-11-11 DIAGNOSIS — R000 Tachycardia, unspecified: Secondary | ICD-10-CM | POA: Diagnosis not present

## 2024-11-11 LAB — COMPREHENSIVE METABOLIC PANEL WITH GFR
ALT: 15 U/L (ref 0–44)
AST: 23 U/L (ref 15–41)
Albumin: 4.3 g/dL (ref 3.5–5.0)
Alkaline Phosphatase: 121 U/L (ref 38–126)
Anion gap: 15 (ref 5–15)
BUN: 13 mg/dL (ref 6–20)
CO2: 22 mmol/L (ref 22–32)
Calcium: 9.3 mg/dL (ref 8.9–10.3)
Chloride: 104 mmol/L (ref 98–111)
Creatinine, Ser: 1 mg/dL (ref 0.44–1.00)
GFR, Estimated: >60 mL/min
Glucose, Bld: 135 mg/dL — ABNORMAL HIGH (ref 70–99)
Potassium: 4.2 mmol/L (ref 3.5–5.1)
Sodium: 142 mmol/L (ref 135–145)
Total Bilirubin: 0.7 mg/dL (ref 0.0–1.2)
Total Protein: 7.5 g/dL (ref 6.5–8.1)

## 2024-11-11 LAB — CBC WITH DIFFERENTIAL/PLATELET
Abs Immature Granulocytes: 0.01 K/uL (ref 0.00–0.07)
Basophils Absolute: 0 K/uL (ref 0.0–0.1)
Basophils Relative: 1 %
Eosinophils Absolute: 0.1 K/uL (ref 0.0–0.5)
Eosinophils Relative: 2 %
HCT: 38.7 % (ref 36.0–46.0)
Hemoglobin: 13.7 g/dL (ref 12.0–15.0)
Immature Granulocytes: 0 %
Lymphocytes Relative: 9 %
Lymphs Abs: 0.7 K/uL (ref 0.7–4.0)
MCH: 29.7 pg (ref 26.0–34.0)
MCHC: 35.4 g/dL (ref 30.0–36.0)
MCV: 83.8 fL (ref 80.0–100.0)
Monocytes Absolute: 0.5 K/uL (ref 0.1–1.0)
Monocytes Relative: 7 %
Neutro Abs: 6.1 K/uL (ref 1.7–7.7)
Neutrophils Relative %: 81 %
Platelets: 193 K/uL (ref 150–400)
RBC: 4.62 MIL/uL (ref 3.87–5.11)
RDW: 12.6 % (ref 11.5–15.5)
WBC: 7.5 K/uL (ref 4.0–10.5)
nRBC: 0 % (ref 0.0–0.2)

## 2024-11-11 LAB — TROPONIN T, HIGH SENSITIVITY
Troponin T High Sensitivity: <15 ng/L (ref 0–19)
Troponin T High Sensitivity: <15 ng/L (ref 0–19)

## 2024-11-11 LAB — LACTIC ACID, PLASMA: Lactic Acid, Venous: 1.9 mmol/L (ref 0.5–1.9)

## 2024-11-11 LAB — MAGNESIUM: Magnesium: 2 mg/dL (ref 1.7–2.4)

## 2024-11-11 LAB — PRO BRAIN NATRIURETIC PEPTIDE: Pro Brain Natriuretic Peptide: 164 pg/mL

## 2024-11-11 LAB — CBG MONITORING, ED: Glucose-Capillary: 125 mg/dL — ABNORMAL HIGH (ref 70–99)

## 2024-11-11 MED ORDER — BENZONATATE 100 MG PO CAPS
200.0000 mg | ORAL_CAPSULE | Freq: Once | ORAL | Status: AC
  Administered 2024-11-11: 200 mg via ORAL
  Filled 2024-11-11: qty 2

## 2024-11-11 MED ORDER — IPRATROPIUM-ALBUTEROL 0.5-2.5 (3) MG/3ML IN SOLN
3.0000 mL | Freq: Once | RESPIRATORY_TRACT | Status: AC
  Administered 2024-11-11: 3 mL via RESPIRATORY_TRACT
  Filled 2024-11-11: qty 3

## 2024-11-11 MED ORDER — HYDROCOD POLI-CHLORPHE POLI ER 10-8 MG/5ML PO SUER
5.0000 mL | Freq: Once | ORAL | Status: AC
  Administered 2024-11-11: 5 mL via ORAL
  Filled 2024-11-11: qty 5

## 2024-11-11 MED ORDER — METHYLPREDNISOLONE SODIUM SUCC 125 MG IJ SOLR
125.0000 mg | Freq: Once | INTRAMUSCULAR | Status: AC
  Administered 2024-11-11: 125 mg via INTRAVENOUS
  Filled 2024-11-11: qty 2

## 2024-11-11 MED ORDER — LACTATED RINGERS IV BOLUS
1000.0000 mL | Freq: Once | INTRAVENOUS | Status: AC
  Administered 2024-11-11: 1000 mL via INTRAVENOUS

## 2024-11-11 MED ORDER — ONDANSETRON HCL 4 MG/2ML IJ SOLN
4.0000 mg | Freq: Once | INTRAMUSCULAR | Status: AC
  Administered 2024-11-11: 4 mg via INTRAVENOUS
  Filled 2024-11-11: qty 2

## 2024-11-11 MED ORDER — ACETAMINOPHEN 325 MG PO TABS
650.0000 mg | ORAL_TABLET | Freq: Once | ORAL | Status: AC
  Administered 2024-11-11: 650 mg via ORAL
  Filled 2024-11-11: qty 2

## 2024-11-11 MED ORDER — HYDROCOD POLI-CHLORPHE POLI ER 10-8 MG/5ML PO SUER: 5.0000 mL | Freq: Two times a day (BID) | ORAL | 0 refills | Status: AC | PRN

## 2024-11-11 MED ORDER — PREDNISONE 20 MG PO TABS: 40.0000 mg | ORAL_TABLET | Freq: Every day | ORAL | 0 refills | Status: AC

## 2024-11-11 MED ORDER — ALBUTEROL SULFATE HFA 108 (90 BASE) MCG/ACT IN AERS: 1.0000 | INHALATION_SPRAY | RESPIRATORY_TRACT | 0 refills | Status: AC | PRN

## 2024-11-11 NOTE — ED Notes (Signed)
 Pts O2 prior to amb 92% on RA; pt amb around nurses desk with minimal assist; pt stated she felt dizzy, sob, and like she was leaning to the left while amb; pts o2 dropped to 88% while amb; pt back in bed with call light within reach; 98% on RA post amb; Paramedic notified

## 2024-11-11 NOTE — Discharge Instructions (Signed)
 Use the albuterol  inhaler 1 to 2 puffs every 4-6 hours as needed.  You may discontinue this once you are feeling better.  Drink plenty of fluids.  You may take Tylenol  if needed for fever.  Take the prednisone  as directed until finished.  Please monitor your blood sugars while taking the prednisone , if your blood sugar becomes too elevated you will need to discontinue the prednisone .  You have also been prescribed medication to help with your cough.  This can cause drowsiness so do not operate machinery or drive while taking the cough medication.  Please follow-up with your primary care provider early next week for recheck.  Return to the emergency department if you develop any new or worsening symptoms

## 2024-11-11 NOTE — ED Provider Notes (Signed)
 " [place] EMERGENCY DEPARTMENT AT [facility] Provider Note   CSN: [id number] Arrival date & time: 11/11/24  1114     Patient presents with: Cough and Chest Pain   Lacey  Bradshaw is a 54 y.o. female.    Cough Associated symptoms: chest pain and shortness of breath   Chest Pain Associated symptoms: cough and shortness of breath         Lacey  Bradshaw is a 54 y.o. female with past medical tree of type 2 diabetes, neuropathy, hypertension, gastroparesis who presents to the Emergency Department complaining of persistent cough and chest pain.  Her symptoms have been associated with shortness of breath.  She endorses episodes of syncope which she attributes to drop in her blood sugar.  She was seen here 2 days ago for her syncope had a CT angio of her chest without evidence of PE.  She states at the time of her previous visit her cough was mild but has worsened and she has had greater than 12 hours of persistent coughing which she states is nonproductive.  No reported fever or chills.  She describes a pressure-like sensation to her central upper chest and some discomfort of her upper back.  She has been having what she describes as palpitations intermittently.  Feels as though her heart is skipping at times.  She does not describe a tearing sensation of her chest or back.  She denies associated any flulike symptoms or fever. Was seen previously at [facility] and given cough medication and Z pack w/o relief.  States she had a negative flu and covid test at [facility].      Prior to Admission medications  Medication Sig Start Date End Date Taking? Authorizing Provider  alprazolam (XANAX) 2 MG tablet Take 2 mg by mouth as needed for anxiety or sleep.    [provider]  atorvastatin (LIPITOR) 80 MG tablet Take 80 mg by mouth daily.    [provider]  BD VEO INSULIN  SYRINGE U/F 31G X 15/64 0.3 ML MISC ONE SYRINGE PER INSULIN  USE 12/18/20   [provider]  Blood Glucose  Monitoring Suppl (ACCU-CHEK GUIDE ME) w/Device KIT Use to check glucose 4 times daily.  Use as back up to CGM 12/31/23   Therisa Benton PARAS, NP  celecoxib (CELEBREX) 200 MG capsule Take 200 mg by mouth 2 (two) times daily. 10/22/22   [provider]  clotrimazole  (LOTRIMIN ) 1 % cream Apply to affected area 2 times daily 07/20/23   Freddi Hamilton, MD  Continuous Glucose Sensor (DEXCOM G7 SENSOR) MISC Inject 1 Application into the skin as directed. Change sensor every 10 days as directed. 06/23/24   Therisa Benton PARAS, NP  cyclobenzaprine  (FLEXERIL ) 5 MG tablet Take 1 tablet (5 mg total) by mouth at bedtime. 04/06/23   Rice, Lonni ORN, MD  gabapentin (NEURONTIN) 300 MG capsule Take 300 mg by mouth as needed. 08/25/22   [provider]  hydrALAZINE (APRESOLINE) 25 MG tablet Take 25 mg by mouth daily. 06/18/22   [provider]  HYDROcodone-acetaminophen  (NORCO) 10-325 MG tablet Take 1 tablet by mouth as needed for severe pain. 10/27/22   [provider]  Insulin  Aspart FlexPen (NOVOLOG ) 100 UNIT/ML Inject 15-21 Units into the skin 3 (three) times daily before meals. 08/29/24   Therisa Benton PARAS, NP  insulin  glargine (LANTUS  SOLOSTAR) 100 UNIT/ML Solostar Pen Inject 80 Units into the skin at bedtime. 06/23/24   Therisa Benton PARAS, NP  Insulin  Pen Needle (PEN NEEDLES)  31G X 6 MM MISC Use to inject insulin  4 times daily 03/14/24   Therisa Benton PARAS, NP  omeprazole (PRILOSEC) 40 MG capsule Take 1 tablet by mouth daily. 05/24/23   [provider]  pregabalin (LYRICA) 100 MG capsule Take 100 mg by mouth 3 (three) times daily. 02/05/20   [provider]  Vitamin D , Ergocalciferol , (DRISDOL ) 1.25 MG (50000 UNIT) CAPS capsule Take 1 capsule (50,000 Units total) by mouth every 7 (seven) days. 06/23/24   Therisa Benton PARAS, NP  vortioxetine HBr (TRINTELLIX) 5 MG TABS tablet Take 5 mg by mouth daily. 09/29/22   [provider]    Allergies: Dapagliflozin,  Dulaglutide, and Liraglutide    Review of Systems  Respiratory:  Positive for cough and shortness of breath.   Cardiovascular:  Positive for chest pain.    Updated Vital Signs BP 99/82   Pulse (!) 121   Temp 98.4 F (36.9 C) (Oral)   Resp 18   Ht 5' 3 (1.6 m)   Wt 91.2 kg   SpO2 96%   BMI 35.61 kg/m   Physical Exam Vitals and nursing note reviewed.  Constitutional:      General: She is not in acute distress.    Appearance: Normal appearance. She is not ill-appearing or diaphoretic.  HENT:     Nose: Nose normal.     Mouth/Throat:     Mouth: Mucous membranes are moist.  Cardiovascular:     Rate and Rhythm: Regular rhythm. Tachycardia present.     Pulses: Normal pulses.  Pulmonary:     Effort: Pulmonary effort is normal.     Breath sounds: No wheezing, rhonchi or rales.     Comments: Patient actively coughing on exam, no hypoxia but only able to speak 1-2 words without forceful coughing.  Lung sounds are clear to auscultation bilaterally Abdominal:     Palpations: Abdomen is soft.     Tenderness: There is no abdominal tenderness.  Musculoskeletal:     Cervical back: Normal range of motion.     Right lower leg: No edema.     Left lower leg: No edema.  Skin:    General: Skin is warm.     Capillary Refill: Capillary refill takes less than 2 seconds.  Neurological:     General: No focal deficit present.     Mental Status: She is alert.     Sensory: No sensory deficit.     Motor: No weakness.     (all labs ordered are listed, but only abnormal results are displayed) Labs Reviewed  COMPREHENSIVE METABOLIC PANEL WITH GFR - Abnormal; Notable for the following components:      Result Value   Glucose, Bld 135 (*)    All other components within normal limits  CBG MONITORING, ED - Abnormal; Notable for the following components:   Glucose-Capillary 125 (*)    All other components within normal limits  MAGNESIUM  PRO BRAIN NATRIURETIC PEPTIDE  CBC WITH  DIFFERENTIAL/PLATELET  LACTIC ACID, PLASMA  CBG MONITORING, ED  TROPONIN T, HIGH SENSITIVITY  TROPONIN T, HIGH SENSITIVITY    EKG: EKG Interpretation Date/Time:  Saturday November 11 2024 11:49:41 EST Ventricular Rate:  113 PR Interval:  153 QRS Duration:  79 QT Interval:  324 QTC Calculation: 445 R Axis:   3  Text Interpretation: Sinus tachycardia Low voltage, precordial leads Confirmed by Cleotilde Rogue ([id number]) on 11/11/2024 2:22:54 PM  Radiology: DG Chest Portable 1 View Result Date: 11/11/2024 CLINICAL DATA:  Nonproductive  cough EXAM: PORTABLE CHEST 1 VIEW COMPARISON:  Two days ago FINDINGS: The heart size and mediastinal contours are within normal limits. Both lungs are clear. The visualized skeletal structures are unremarkable. IMPRESSION: No active disease. Electronically Signed   By: Lynwood Landy Raddle M.D.   On: 11/11/2024 12:14     Procedures   Medications Ordered in the ED - No data to display                                  Medical Decision Making   Patient here for persistent cough that is nonproductive.  Has worsened over the last 24 hours.  She was seen here 2 days ago for syncope.  She had a negative CT angio of the chest.  She describes chest pain that is associated with her coughing.  She has been unable to sleep or eat due to persistent coughing.  Cough is worse when she is supine.  She denies any fever and states she had a recent negative COVID and flu test performed at urgent care  On my exam, patient actively coughing unable to speak more than 1-2 words without coughing.  Her lung sounds are clear to auscultation.  No hypoxia.  She is tachycardic and intermittently tachypneic.  She endorses having syncope earlier in the week but no further syncope since her ER visit 2 days ago.  I suspect this is viral process.  Pneumonia is also of consideration.  She had a reassuring CT angio study 2 days ago.  Doubt this is dissection.  Her blood pressure was soft upon arrival  this improved after IV fluids. I will recheck labs ordered nebs and steroids, antitussive.  I anticipate discharge home if coughing improves and she can ambulate without hypoxia.  I do not feel that repeat CT imaging is needed at this time as she had CT angio study 2 days ago  Amount and/or Complexity of Data Reviewed Labs: ordered.    Details: Labs overall reassuring.  Lactic acid unremarkable no leukocytosis, troponin reassuring. Radiology: ordered.    Details: Chest x-ray today without acute cardiopulmonary disease.  Reviewed interpretation of CT angio of the chest from 11/09/2024.  Negative for PE ECG/medicine tests: ordered.    Details: EKG here shows sinus tachycardia Discussion of management or test interpretation with external provider(s):  Workup here overall reassuring.  She has been given IV fluids, antitussive and steroid.  Her lung sounds remain clear.  Her coughing has significantly improved after cough medication and albuterol  nebs.  She ambulated in the department without persistent hypoxia.  Cough is suspected to be viral.  Recent workup without evidence of PE.  Low clinical suspicion for dissection  Will treat symptomatically with albuterol  MDI, steroids and cough medication.  She has PCP and agrees to close outpatient follow-up early next week.  She was given strict ER return precautions as well.  Risk OTC drugs. Prescription drug management.        Final diagnoses:  Viral URI with cough    ED Discharge Orders     None          Herlinda Milling, PA-C 11/13/24 1458    Cleotilde Rogue, MD 11/17/24 2300  "

## 2024-11-11 NOTE — ED Triage Notes (Signed)
 Pt from home complains of coughing, cp and SOB. Pt says cough is non-productive.. No hx of breathing problems. Pt aaox4, ambulatory. O2 sat 98% on RA

## 2024-11-11 NOTE — ED Notes (Signed)
 Walked in and found patient oxygen saturation at 88%, patient had a cough, no immediate respiratory distress.  Placed patient on 2 Liters via [place]. EDP notified.

## 2024-11-15 ENCOUNTER — Ambulatory Visit (HOSPITAL_COMMUNITY): Payer: MEDICAID | Admitting: Psychiatry

## 2024-11-15 DIAGNOSIS — F331 Major depressive disorder, recurrent, moderate: Secondary | ICD-10-CM

## 2024-11-15 NOTE — Progress Notes (Signed)
 Virtual Visit via Telephone Note  I connected with Lacey  Bradshaw on 11/15/2024 at 1:06 PM EDT by telephone and verified that I am speaking with the correct person using two identifiers.  Location: Patient: Home Provider: [facility] [place] [facility]    I discussed the limitations, risks, security and privacy concerns of performing an evaluation and management service by telephone and the availability of in person appointments. I also discussed with the patient that there may be a patient responsible charge related to this service. The patient expressed understanding and agreed to proceed.   I provided 27 minutes of non-face-to-face time during this encounter.   Winton FORBES Rubinstein, LCSW     THERAPIST PROGRESS NOTE  Session Time: Wednesday 11/15/2024 1:06 PM  - 1:33 PM   Participation Level: Active  Behavioral Response: CasualAlertAnxious  Type of Therapy: Individual Therapy  Treatment Goals addressed: Lacey Bradshaw  will score less than 5 on the Generalized Anxiety Disorder 7 Scale (GAD-7   Lean and implement 3 relaxation techniques, practice a technique daily    ProgressTowards Goals: progressing   Interventions: CBT and Supportive  Summary: Lacey  Bradshaw is a 54 y.o. female who is   is referred for services by PCP. She reports one psychiatric hospitalization in 2022 in [facility] due to suicide attempt and depression.Pt reports participating in outpatient therapy in [place] and last was seen in 2017.Pt reports initially beginning to have problems with depression in 2017 when she was laid off her job.  She also began to have problems with anxiety.  Patient states often thinking about the past and says she is an over thinker.  She states difficulty sleeping and being snappy with people.  Other symptoms include poor concentration, fatigue, hopelessness, irritability muscle tension, restlessness, and worrying.  Stressors include concerns about her daughter who has overcome drug addiction  and being clean for almost 3 years.  Patient reports fear of daughter relapsing.  Patient reports additional stress regarding her situation as she does not have assisted in the source of income.  Patient has applied for disability and has been denied twice.          Patient last was seen about 2 weeks ago. She reports continued stress and symptoms of anxiety. Per her report, she has been sick since 11/03/2024 and has been to the ED 2-3 x. She has been diagnosed with upper respiratory infection. Pt reports it feels like the flu. She has experienced even more health complications during this time due to uncontrolled diabetes. Per pt's report, her sugar level has been in the 40's since Saturday when she went to ED. She reports doctors wanted to hospitalize her but she persuaded them to allow her to go home. She currently is staying in her sister's home and now is worried about her father who also is sick. She expresses frustration he is staying alone and other family members will not stay with him despite her requests for them to help. She is planning to return to her father's home today but plans to go to ED due to low blood sugar.  She expresses continued frustration with her daughter but reports having an assertive conversation with daughter to set limits regarding her financial assistance.  Therapist and patient agreed to in session early as patient is not feeling well.   Suicidal/Homicidal: Nowithout intent/plan  Therapist Response: Reviewed symptoms, discussed stressors, facilitated expression of thoughts and feelings, validated feelings, praised and reinforced patient's efforts to use assertiveness skills, discussed patient prioritizing her health, encouraged patient  to follow through with her plan to return to the ED as her blood sugar level remains very low, ended session early as patient is not feeling well Plan: Return again in 2 weeks.        Diagnosis: Major depressive disorder, recurrent,  moderate  Collaboration of Care: Primary Care Provider AEB patient works with PCP regarding medication management  Patient/Guardian was advised Release of Information must be obtained prior to any record release in order to collaborate their care with an outside provider. Patient/Guardian was advised if they have not already done so to contact the registration department to sign all necessary forms in order for us  to release information regarding their care.   Consent: Patient/Guardian gives verbal consent for treatment and assignment of benefits for services provided during this visit. Patient/Guardian expressed understanding and agreed to proceed.   Winton FORBES Rubinstein, LCSW 11/15/2024

## 2024-11-29 ENCOUNTER — Ambulatory Visit (HOSPITAL_COMMUNITY): Payer: MEDICAID | Admitting: Psychiatry

## 2024-11-29 DIAGNOSIS — F331 Major depressive disorder, recurrent, moderate: Secondary | ICD-10-CM | POA: Diagnosis not present

## 2024-11-29 NOTE — Progress Notes (Signed)
 Virtual Visit via Telephone Note  I connected with Vonna  Minetti on 11/29/24 at 10:15 AM EST by telephone and verified that I am speaking with the correct person using two identifiers.  Location: Patient: Home  Provider: [facility] [place] office    I discussed the limitations, risks, security and privacy concerns of performing an evaluation and management service by telephone and the availability of in person appointments. I also discussed with the patient that there may be a patient responsible charge related to this service. The patient expressed understanding and agreed to proceed.   I provided 50 minutes of non-face-to-face time during this encounter.   Winton FORBES Rubinstein, LCSW  THERAPIST PROGRESS NOTE  Session Time: Wednesday 11/29/2024 10:15 AM - 11:05 AM   Participation Level: Active  Behavioral Response: CasualAlertAnxious  Type of Therapy: Individual Therapy  Treatment Goals addressed: Lacey Bradshaw  will score less than 5 on the Generalized Anxiety Disorder 7 Scale (GAD-7   Lean and implement 3 relaxation techniques, practice a technique daily    ProgressTowards Goals: progressing   Interventions: CBT and Supportive  Summary: Florice  Holmes is a 54 y.o. female who is   is referred for services by PCP. She reports one psychiatric hospitalization in 2022 in [place] due to suicide attempt and depression. Pt reports participating in outpatient therapy in [place] and last was seen in 2017.Pt reports initially beginning to have problems with depression in 2017 when she was laid off her job.  She also began to have problems with anxiety.  Patient states often thinking about the past and says she is an over thinker.  She states difficulty sleeping and being snappy with people.  Other symptoms include poor concentration, fatigue, hopelessness, irritability muscle tension, restlessness, and worrying.  Stressors include concerns about her daughter who has overcome drug addiction  and being clean for almost 3 years.  Patient reports fear of daughter relapsing.  Patient reports additional stress regarding her situation as she does not have assisted in the source of income.  Patient has applied for disability and has been denied twice.          Patient last was seen about 2 weeks ago. She reports continued stress and symptoms of anxiety. She also reports increased symptoms of depression including depressed mood, lack of interest in doing things as well as decreased interest in self-care.  She denies any plan or intent to harm self but states she would not care if something happened to her life.   Patient currently is not taking any antidepressants and reports she takes Xanax only when absolutely necessary.  She reports she has taken about 1/week in the past 4 weeks.  She has been reluctant regarding medication as she worries about the interaction with other medicines. Patient reports continued stress regarding caretaker responsibilities for her father and states he is experiencing more signs of dementia.  She also continues to report stress about the relationship with her boyfriend and expresses ambivalent feelings about possibly ending the relationship.  Patient still reports not wanting to hurt anyone's feelings. She still doesn't  feel well and reports she was informed by her medical provider she has the super flu.     Suicidal/Homicidal: Nowithout intent/plan patient agrees to call [phone], [phone], have someone take her to the ED should symptoms worsen  Therapist Response: Reviewed symptoms, discussed the role of medication in coping with depression, discussed referral to NP Shuvon Rankin for medication evaluation, discussed stressors, facilitated expression of thoughts and feelings, validated feelings, assisted  patient identify helpful strategies she has used in the past to cope with depression, developed plan with patient to journal and do art work once daily, assisted patient identify  ways to self nurture and encouraged patient to follow through with her plan to go to the hair and nail salon  Plan: Return again in 2 weeks.        Diagnosis: Major depressive disorder, recurrent, moderate  Collaboration of Care: Primary Care Provider AEB patient works with PCP regarding medication management  Patient/Guardian was advised Release of Information must be obtained prior to any record release in order to collaborate their care with an outside provider. Patient/Guardian was advised if they have not already done so to contact the registration department to sign all necessary forms in order for us  to release information regarding their care.   Consent: Patient/Guardian gives verbal consent for treatment and assignment of benefits for services provided during this visit. Patient/Guardian expressed understanding and agreed to proceed.   Winton FORBES Rubinstein, LCSW 11/29/2024

## 2024-12-22 ENCOUNTER — Ambulatory Visit (HOSPITAL_COMMUNITY): Payer: MEDICAID | Admitting: Psychiatry

## 2025-01-05 ENCOUNTER — Ambulatory Visit (HOSPITAL_COMMUNITY): Payer: MEDICAID | Admitting: Psychiatry

## 2025-01-15 ENCOUNTER — Ambulatory Visit (HOSPITAL_COMMUNITY): Payer: MEDICAID | Admitting: Registered Nurse

## 2025-01-19 ENCOUNTER — Ambulatory Visit (HOSPITAL_COMMUNITY): Payer: MEDICAID | Admitting: Psychiatry

## 2025-02-05 ENCOUNTER — Ambulatory Visit: Payer: MEDICAID | Admitting: Cardiology
